# Patient Record
Sex: Female | Born: 1980 | Race: Black or African American | Hispanic: No | Marital: Single | Smoking: Never smoker
Health system: Southern US, Academic
[De-identification: ages and names within clinical notes are randomized; demographics above are authoritative.]

## PROBLEM LIST (undated history)

## (undated) ENCOUNTER — Emergency Department (HOSPITAL_BASED_OUTPATIENT_CLINIC_OR_DEPARTMENT_OTHER): Admission: EM | Payer: Medicaid Other

## (undated) DIAGNOSIS — R51 Headache: Secondary | ICD-10-CM

## (undated) DIAGNOSIS — R519 Headache, unspecified: Secondary | ICD-10-CM

## (undated) DIAGNOSIS — K5792 Diverticulitis of intestine, part unspecified, without perforation or abscess without bleeding: Secondary | ICD-10-CM

## (undated) DIAGNOSIS — F319 Bipolar disorder, unspecified: Secondary | ICD-10-CM

## (undated) DIAGNOSIS — IMO0001 Reserved for inherently not codable concepts without codable children: Secondary | ICD-10-CM

## (undated) DIAGNOSIS — F209 Schizophrenia, unspecified: Secondary | ICD-10-CM

## (undated) DIAGNOSIS — G4730 Sleep apnea, unspecified: Secondary | ICD-10-CM

## (undated) DIAGNOSIS — N390 Urinary tract infection, site not specified: Secondary | ICD-10-CM

## (undated) DIAGNOSIS — J449 Chronic obstructive pulmonary disease, unspecified: Secondary | ICD-10-CM

## (undated) HISTORY — PX: NECK SURGERY: SHX720

## (undated) HISTORY — PX: COLON SURGERY: SHX602

## (undated) HISTORY — PX: HX TUBAL LIGATION: SHX77

## (undated) HISTORY — PX: ENDOMETRIAL ABLATION: SHX621

---
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
## 2007-05-16 HISTORY — PX: TUBAL LIGATION: SHX77

## 2007-10-08 ENCOUNTER — Emergency Department (HOSPITAL_COMMUNITY): Admission: EM | Admit: 2007-10-08 | Discharge: 2007-10-08 | Payer: Self-pay | Admitting: Emergency Medicine

## 2008-06-22 ENCOUNTER — Emergency Department (HOSPITAL_COMMUNITY): Admission: EM | Admit: 2008-06-22 | Discharge: 2008-06-22 | Payer: Self-pay | Admitting: Emergency Medicine

## 2008-12-14 ENCOUNTER — Emergency Department (HOSPITAL_COMMUNITY): Admission: EM | Admit: 2008-12-14 | Discharge: 2008-12-15 | Payer: Self-pay | Admitting: Emergency Medicine

## 2009-07-19 ENCOUNTER — Emergency Department (HOSPITAL_COMMUNITY): Admission: EM | Admit: 2009-07-19 | Discharge: 2009-07-19 | Payer: Self-pay | Admitting: Family Medicine

## 2009-09-05 ENCOUNTER — Emergency Department (HOSPITAL_COMMUNITY): Admission: EM | Admit: 2009-09-05 | Discharge: 2009-09-05 | Payer: Self-pay | Admitting: Family Medicine

## 2009-11-28 ENCOUNTER — Ambulatory Visit: Payer: Self-pay | Admitting: Psychiatry

## 2009-11-28 ENCOUNTER — Inpatient Hospital Stay (HOSPITAL_COMMUNITY): Admission: RE | Admit: 2009-11-28 | Discharge: 2009-12-04 | Payer: Self-pay | Admitting: Psychiatry

## 2009-11-29 ENCOUNTER — Encounter: Payer: Self-pay | Admitting: Psychiatry

## 2010-05-11 ENCOUNTER — Emergency Department (HOSPITAL_COMMUNITY)
Admission: EM | Admit: 2010-05-11 | Discharge: 2010-05-11 | Payer: Self-pay | Source: Home / Self Care | Admitting: Emergency Medicine

## 2010-07-25 LAB — GC/CHLAMYDIA PROBE AMP, GENITAL
Chlamydia, DNA Probe: NEGATIVE
GC Probe Amp, Genital: NEGATIVE

## 2010-07-25 LAB — URINE MICROSCOPIC-ADD ON

## 2010-07-25 LAB — URINALYSIS, ROUTINE W REFLEX MICROSCOPIC
Leukocytes, UA: NEGATIVE
Protein, ur: NEGATIVE mg/dL
Specific Gravity, Urine: 1.02 (ref 1.005–1.030)
pH: 7 (ref 5.0–8.0)

## 2010-07-25 LAB — POCT PREGNANCY, URINE: Preg Test, Ur: NEGATIVE

## 2010-07-30 LAB — COMPREHENSIVE METABOLIC PANEL
ALT: 14 U/L (ref 0–35)
Alkaline Phosphatase: 51 U/L (ref 39–117)
BUN: 7 mg/dL (ref 6–23)
Chloride: 107 mEq/L (ref 96–112)
Creatinine, Ser: 0.92 mg/dL (ref 0.4–1.2)
Glucose, Bld: 94 mg/dL (ref 70–99)
Potassium: 3.7 mEq/L (ref 3.5–5.1)
Sodium: 139 mEq/L (ref 135–145)
Total Bilirubin: 0.6 mg/dL (ref 0.3–1.2)
Total Protein: 6.8 g/dL (ref 6.0–8.3)

## 2010-07-30 LAB — URINALYSIS, ROUTINE W REFLEX MICROSCOPIC
Glucose, UA: NEGATIVE mg/dL
Ketones, ur: NEGATIVE mg/dL
Urobilinogen, UA: 0.2 mg/dL (ref 0.0–1.0)
pH: 5.5 (ref 5.0–8.0)

## 2010-07-30 LAB — URINE DRUGS OF ABUSE SCREEN W ALC, ROUTINE (REF LAB)
Barbiturate Quant, Ur: NEGATIVE
Benzodiazepines.: NEGATIVE
Cocaine Metabolites: NEGATIVE
Creatinine,U: 202.4 mg/dL
Opiate Screen, Urine: NEGATIVE

## 2010-07-30 LAB — URINE MICROSCOPIC-ADD ON

## 2010-07-30 LAB — CBC
HCT: 39.6 % (ref 36.0–46.0)
RBC: 4.28 MIL/uL (ref 3.87–5.11)

## 2010-08-20 LAB — URINE MICROSCOPIC-ADD ON

## 2010-08-20 LAB — URINALYSIS, ROUTINE W REFLEX MICROSCOPIC
Glucose, UA: NEGATIVE mg/dL
Hgb urine dipstick: NEGATIVE
Specific Gravity, Urine: 1.02 (ref 1.005–1.030)

## 2010-08-20 LAB — URINE CULTURE

## 2010-08-20 LAB — POCT PREGNANCY, URINE: Preg Test, Ur: NEGATIVE

## 2010-08-30 LAB — HEMOCCULT GUIAC POC 1CARD (OFFICE): Fecal Occult Bld: NEGATIVE

## 2011-02-08 LAB — WET PREP, GENITAL
Trich, Wet Prep: NONE SEEN
Yeast Wet Prep HPF POC: NONE SEEN

## 2011-02-08 LAB — URINE MICROSCOPIC-ADD ON

## 2011-02-08 LAB — URINALYSIS, ROUTINE W REFLEX MICROSCOPIC
Glucose, UA: NEGATIVE
Ketones, ur: NEGATIVE
pH: 7.5

## 2011-02-08 LAB — RPR: RPR Ser Ql: NONREACTIVE

## 2011-02-08 LAB — GC/CHLAMYDIA PROBE AMP, GENITAL
Chlamydia, DNA Probe: NEGATIVE
GC Probe Amp, Genital: NEGATIVE

## 2011-03-13 ENCOUNTER — Encounter: Payer: Self-pay | Admitting: Medical

## 2011-05-18 ENCOUNTER — Encounter: Payer: Self-pay | Admitting: Family Medicine

## 2011-05-18 ENCOUNTER — Emergency Department (HOSPITAL_BASED_OUTPATIENT_CLINIC_OR_DEPARTMENT_OTHER)
Admission: EM | Admit: 2011-05-18 | Discharge: 2011-05-18 | Discharge status: Against Medical Advice | Payer: Medicaid Other | Attending: Emergency Medicine | Admitting: Emergency Medicine

## 2011-05-18 DIAGNOSIS — J069 Acute upper respiratory infection, unspecified: Secondary | ICD-10-CM | POA: Insufficient documentation

## 2011-05-18 HISTORY — DX: Schizophrenia, unspecified: F20.9

## 2011-05-18 HISTORY — DX: Urinary tract infection, site not specified: N39.0

## 2011-05-18 HISTORY — DX: Bipolar disorder, unspecified: F31.9

## 2011-05-18 NOTE — ED Notes (Signed)
Pt c/o itchy eyes, headache, sinus congestion and requesting medication refill for several medications. Pt also reports having a recent UTI and not finishing abx for same. Pt requesting to have urine checked.

## 2012-05-20 ENCOUNTER — Encounter (HOSPITAL_COMMUNITY): Payer: Self-pay | Admitting: Emergency Medicine

## 2012-05-20 ENCOUNTER — Emergency Department (HOSPITAL_COMMUNITY)
Admission: EM | Admit: 2012-05-20 | Discharge: 2012-05-20 | Disposition: A | Discharge status: Home / Self Care | Payer: Medicaid Other | Attending: Emergency Medicine | Admitting: Emergency Medicine

## 2012-05-20 DIAGNOSIS — Z87440 Personal history of urinary (tract) infections: Secondary | ICD-10-CM | POA: Insufficient documentation

## 2012-05-20 DIAGNOSIS — H579 Unspecified disorder of eye and adnexa: Secondary | ICD-10-CM | POA: Insufficient documentation

## 2012-05-20 DIAGNOSIS — F319 Bipolar disorder, unspecified: Secondary | ICD-10-CM | POA: Insufficient documentation

## 2012-05-20 DIAGNOSIS — J3489 Other specified disorders of nose and nasal sinuses: Secondary | ICD-10-CM | POA: Insufficient documentation

## 2012-05-20 DIAGNOSIS — H109 Unspecified conjunctivitis: Secondary | ICD-10-CM | POA: Insufficient documentation

## 2012-05-20 DIAGNOSIS — J029 Acute pharyngitis, unspecified: Secondary | ICD-10-CM | POA: Insufficient documentation

## 2012-05-20 DIAGNOSIS — F209 Schizophrenia, unspecified: Secondary | ICD-10-CM | POA: Insufficient documentation

## 2012-05-20 DIAGNOSIS — F17200 Nicotine dependence, unspecified, uncomplicated: Secondary | ICD-10-CM | POA: Insufficient documentation

## 2012-05-20 DIAGNOSIS — H5789 Other specified disorders of eye and adnexa: Secondary | ICD-10-CM | POA: Insufficient documentation

## 2012-05-20 DIAGNOSIS — Z9851 Tubal ligation status: Secondary | ICD-10-CM | POA: Insufficient documentation

## 2012-05-20 MED ORDER — BACITRACIN-POLYMYXIN B 500-10000 UNIT/GM OP OINT: TOPICAL_OINTMENT | Freq: Two times a day (BID) | OPHTHALMIC | Status: DC

## 2012-05-20 MED ORDER — NAPHAZOLINE-PHENIRAMINE 0.025-0.3 % OP SOLN: 1.0000 [drp] | OPHTHALMIC | Status: DC | PRN

## 2012-05-20 MED ORDER — FLUORESCEIN SODIUM 1 MG OP STRP
ORAL_STRIP | OPHTHALMIC | Status: AC
  Administered 2012-05-20: 10:00:00
  Filled 2012-05-20: qty 1

## 2012-05-20 MED ORDER — TETRACAINE HCL 0.5 % OP SOLN
2.0000 [drp] | Freq: Once | OPHTHALMIC | Status: AC
  Administered 2012-05-20: 2 [drp] via OPHTHALMIC
  Filled 2012-05-20: qty 2

## 2012-05-20 NOTE — ED Provider Notes (Signed)
History     CSN: [id number]  Arrival date & time 05/20/12  0805   First MD Initiated Contact with Patient 05/20/12 [phone]      Chief Complaint  Patient presents with  . Eye Pain    (Consider location/radiation/quality/duration/timing/severity/associated sxs/prior treatment) HPI SUBJECTIVE:  32 y.o. female with burning, redness, discharge and mattering in left eye for 4 days.  Patient has associated sore throat  And nasal pain.No other symptoms.  No significant prior ophthalmological history. No change in visual acuity, no photophobia, no severe eye pain. No known injury   Past Medical History  Diagnosis Date  . Bipolar disorder   . Schizophrenia   . UTI (lower urinary tract infection)     Past Surgical History  Procedure Date  . Tubal ligation     History reviewed. No pertinent family history.  History  Substance Use Topics  . Smoking status: Current Every Day Smoker  . Smokeless tobacco: Not on file  . Alcohol Use: No    OB History    Grav Para Term Preterm Abortions TAB SAB Ect Mult Living                  Review of Systems Ten systems reviewed and are negative for acute change, except as noted in the HPI.   Allergies  Review of patient's allergies indicates no known allergies.  Home Medications   Current Outpatient Rx  Name  Route  Sig  Dispense  Refill  . METRONIDAZOLE PO   Oral   Take 4 tablets by mouth once.         Marland Kitchen SIMILASAN EYE DROPS #1 OP   Left Eye   Place 3 drops into the left eye daily as needed. Eye pain.           BP 128/68  Pulse 108  Temp 98.4 F (36.9 C) (Oral)  Resp 18  SpO2 100%  Physical Exam Patient appears well, vitals signs are normal.  Nursing note and vitals reviewed. HENT:  Head: Normocephalic and atraumatic.  Eyes: left eye with findings of typical conjunctivitis noted; erythema and discharge. PERRLA, no foreign body noted.  No corneal abrasion. No periorbital cellulitis. The corneas are clear and fundi  normal. Visual acuity normal. Oropharyngeal exam - erythematous and cobblestoning ofthe posterior pharynx consistent with Post Nasal Drip. Neck: Normal range of motion.  Cardiovascular: Normal rate, regular rhythm and normal heart sounds.  Exam reveals no gallop and no friction rub.   No murmur heard. Pulmonary/Chest: Effort normal and breath sounds normal. No respiratory distress.  Abdominal: Soft. Bowel sounds are normal. She exhibits no distension and no mass. There is no tenderness. There is no guarding.  Neurological: She is alert and oriented to person, place, and time.  Skin: Skin is warm and dry. She is not diaphoretic.     ED Course  Procedures (including critical care time Corneal Abrasion Exam VCO. Risks, benefits, and alternatives explained. 2 drops of tetracaine (PONTOCAINE) 0.5 % ophthalmic solution were applied to the L eye. Fluorescein 1 MG ophthalmic strip applied the the surface of the L eye Wood's lamp used to screen for abrasion  Eye flushed with sterile saline Patient tolerated th procedure well No corneal abrasions found on the exam.   Labs Reviewed - No data to display No results found.   1. Conjunctivitis       MDM  Patient presentation consistent with bacterial conjunctivitis.There is dischare but no, corneal abrasions, entrapment, consensual photophobia, or dendritic  staining with fluorescein study.  Presentation non-concerning for iritis, bacterial conjunctivitis, corneal abrasions, or HSV.  Antibiotics are indicated and patient will be prescribed naphazoline for itching.  Personal hygiene and frequent handwashing discussed.  Patient advised to followup with ophthalmologist if symptoms persist or worsen in any way including vision change or purulent discharge.  Patient verbalizes understanding and is agreeable with discharge.        Arthor Captain, PA-C 05/23/12 1011

## 2012-05-20 NOTE — ED Notes (Signed)
Pt c/o left eye redness x 3 days and sore throat

## 2012-05-20 NOTE — ED Notes (Signed)
Left eye red, irritated with swollen lids. Onset 2-3 days ago. No known injury. Also c/o sore throat.

## 2012-05-22 ENCOUNTER — Emergency Department (HOSPITAL_COMMUNITY)
Admission: EM | Admit: 2012-05-22 | Discharge: 2012-05-22 | Disposition: A | Discharge status: Home / Self Care | Payer: Medicaid Other | Source: Home / Self Care | Attending: Emergency Medicine | Admitting: Emergency Medicine

## 2012-05-22 ENCOUNTER — Encounter (HOSPITAL_COMMUNITY): Payer: Self-pay | Admitting: *Deleted

## 2012-05-22 DIAGNOSIS — K122 Cellulitis and abscess of mouth: Secondary | ICD-10-CM

## 2012-05-22 MED ORDER — AMOXICILLIN-POT CLAVULANATE 500-125 MG PO TABS: 1.0000 | ORAL_TABLET | Freq: Two times a day (BID) | ORAL | Status: AC

## 2012-05-22 MED ORDER — ACETAMINOPHEN-CODEINE #3 300-30 MG PO TABS: 1.0000 | ORAL_TABLET | Freq: Four times a day (QID) | ORAL | Status: DC | PRN

## 2012-05-22 NOTE — ED Notes (Signed)
PT  REPORTS    SWELLING  TO  L  SIDE OF EAR  AS  WELL  SWELLING IN  HER THROAT   SEEN  YEST  ER   FOR  CONJUNCTIVITIS        STATES  WAS  GIVEN  CREAM  AND  DROPS        THE  L  EYE  IS  RED  IRRITATED   AND  WATERY        SHE  IS  SITTING  UPRIGHT ON  EXAM TABLE   SPEAKING IN  COMPLETE  SENTANCES

## 2012-05-22 NOTE — ED Provider Notes (Signed)
History     CSN: [id number]  Arrival date & time 05/22/12  1001   First MD Initiated Contact with Patient 05/22/12 1011      Chief Complaint  Patient presents with  . Sore Throat    (Consider location/radiation/quality/duration/timing/severity/associated sxs/prior treatment) HPI Comments: Patient presents urgent care describing that although her left eye swelling and redness has improved he started with the antibiotic eye ointment, she feels that when she swallows it constantly hurts and "it feels like something is hanging in the back of her throat". Her throat is still not improving and is actually worse. Patient denies any fevers, shortness of breath or coughing. She describes it in front of her left ear she has a little bump that is sore at touch. ( Palpable preauricular lymph node).   Patient is a 32 y.o. female presenting with pharyngitis. The history is provided by the patient.  Sore Throat This is a new problem. The current episode started more than 2 days ago. The problem occurs constantly. The problem has been gradually worsening. Pertinent negatives include no abdominal pain. The symptoms are aggravated by swallowing. Nothing relieves the symptoms. The treatment provided no relief.    Past Medical History  Diagnosis Date  . Bipolar disorder   . Schizophrenia   . UTI (lower urinary tract infection)     Past Surgical History  Procedure Date  . Tubal ligation     No family history on file.  History  Substance Use Topics  . Smoking status: Current Every Day Smoker  . Smokeless tobacco: Not on file  . Alcohol Use: No    OB History    Grav Para Term Preterm Abortions TAB SAB Ect Mult Living                  Review of Systems  Constitutional: Negative for activity change and appetite change.  HENT: Positive for congestion, sore throat and trouble swallowing. Negative for facial swelling, rhinorrhea, drooling, mouth sores, neck pain, neck stiffness, dental problem  and postnasal drip.   Eyes: Positive for redness. Negative for photophobia, pain and visual disturbance.  Gastrointestinal: Negative for abdominal pain.  Skin: Negative for rash.  Neurological: Negative for dizziness.    Allergies  Review of patient's allergies indicates no known allergies.  Home Medications   Current Outpatient Rx  Name  Route  Sig  Dispense  Refill  . AMOXICILLIN-POT CLAVULANATE 500-125 MG PO TABS   Oral   Take 1 tablet (500 mg total) by mouth 2 (two) times daily.   20 tablet   0   . BACITRACIN-POLYMYXIN B 500-10000 UNIT/GM OP OINT   Left Eye   Place into the left eye every 12 (twelve) hours. apply to eye every 12 hours while awake   3.5 g   0   . METRONIDAZOLE PO   Oral   Take 4 tablets by mouth once.         Marland Kitchen SIMILASAN EYE DROPS #1 OP   Left Eye   Place 3 drops into the left eye daily as needed. Eye pain.         Marland Kitchen NAPHAZOLINE-PHENIRAMINE 0.025-0.3 % OP SOLN   Ophthalmic   Apply 1-2 drops to eye every 4 (four) hours as needed.   15 mL   0     BP 130/90  Pulse 115  Temp 98.7 F (37.1 C) (Oral)  Resp 18  SpO2 96%  Physical Exam  Nursing note and vitals reviewed. Constitutional: She  appears well-developed and well-nourished. No distress.  HENT:  Head: Normocephalic. No trismus in the jaw.  Right Ear: Tympanic membrane and ear canal normal.  Left Ear: Tympanic membrane and ear canal normal.  Mouth/Throat: Mucous membranes are normal. No oral lesions. Uvula swelling present. No dental abscesses. Posterior oropharyngeal erythema present. No oropharyngeal exudate, posterior oropharyngeal edema or tonsillar abscesses.    Eyes: EOM are normal. Pupils are equal, round, and reactive to light. Right eye exhibits no discharge. Left eye exhibits no discharge. No scleral icterus.  Neurological: She is alert.  Skin: No rash noted. No erythema.    ED Course  Procedures (including critical care time)  Labs Reviewed - No data to display No  results found.   1. Uvulitis    #2 BACTERIAL conjunctivitis   MDM  Problem #1. Patient has been prescribed Augmentin prescription and Tylenol #3 and encouraged to drink abundant fluids. She was instructed about symptoms that should warrant her evaluation emergency department most importantly difficulty swallowing solids and fluids. She acknowledges and agrees with treatment plan and followup care as necessary  Problem #2 left eye conjunctivitis/blepharitis patient is currently using an antibiotic ointment and reporting improvement. Have instructed patient to followup with ophthalmologist if no partial or complete improvement in the next 48 hours. ( Patient reports discomfort, no photophobia, no visual changes and had a negative FLUORESCEIN same test 48 hours ago)        Jimmie Molly, MD 05/22/12 1045

## 2012-05-23 NOTE — ED Provider Notes (Signed)
Medical screening examination/treatment/procedure(s) were performed by non-physician practitioner and as supervising physician I was immediately available for consultation/collaboration.   Flint Melter, MD 05/23/12 [phone]

## 2012-06-03 ENCOUNTER — Emergency Department (HOSPITAL_COMMUNITY)
Admission: EM | Admit: 2012-06-03 | Discharge: 2012-06-03 | Disposition: A | Discharge status: Home / Self Care | Payer: Medicaid Other | Source: Home / Self Care | Attending: Emergency Medicine | Admitting: Emergency Medicine

## 2012-06-03 ENCOUNTER — Encounter (HOSPITAL_COMMUNITY): Payer: Self-pay | Admitting: *Deleted

## 2012-06-03 DIAGNOSIS — J029 Acute pharyngitis, unspecified: Secondary | ICD-10-CM

## 2012-06-03 DIAGNOSIS — K122 Cellulitis and abscess of mouth: Secondary | ICD-10-CM

## 2012-06-03 LAB — POCT RAPID STREP A: Streptococcus, Group A Screen (Direct): NEGATIVE

## 2012-06-03 MED ORDER — CLINDAMYCIN HCL 300 MG PO CAPS: 300.0000 mg | ORAL_CAPSULE | Freq: Two times a day (BID) | ORAL | Status: DC

## 2012-06-03 MED ORDER — FLUTICASONE PROPIONATE 50 MCG/ACT NA SUSP: 2.0000 | Freq: Every day | NASAL | Status: DC

## 2012-06-03 MED ORDER — PREDNISONE 20 MG PO TABS: 20.0000 mg | ORAL_TABLET | Freq: Two times a day (BID) | ORAL | Status: DC

## 2012-06-03 NOTE — ED Notes (Signed)
Recheck from 1/6 and 1/8 for uvulaitis and headache.  States she had conjunctivitis but that cleared up with steroid drops from the eye doctor.  Can't get an appt. With PCP until 2/4.  Getting chills and gets hot, but does not know if she is running a fever.  C/o coughing and sneezing.  Nasal drainage is white and congestion from chest/throat is green.  C/o sorethroat, and aching in her back and feels weak.  No chest pain.

## 2012-06-03 NOTE — ED Provider Notes (Signed)
Medical screening examination/treatment/procedure(s) were performed by non-physician practitioner and as supervising physician I was immediately available for consultation/collaboration.  Leslee Home, M.D.   Reuben Likes, MD 06/03/12 [phone]

## 2012-06-03 NOTE — ED Provider Notes (Signed)
History     CSN: [id number]  Arrival date & time 06/03/12  1537   First MD Initiated Contact with Patient 06/03/12 1728      Chief Complaint  Patient presents with  . Oral Swelling    (Consider location/radiation/quality/duration/timing/severity/associated sxs/prior treatment) The history is provided by the patient.  32 year female with known history of seasonal allergies for which she takes claritin daily.  Pt states she was evaluated in UC approximately 2 weeks ago for sore throat.  Subsequently dx with uvulitis and was prescribed augmentin.  Has since finished antibiotics and continues to have symptoms of sore throat.  Pt denies fever but endorses sinus congestion as well as generalized fatigue.  Denies change in home medications, no rash or s/s of allergic reaction.  Current smoker.   Past Medical History  Diagnosis Date  . Bipolar disorder   . Schizophrenia   . UTI (lower urinary tract infection)     Past Surgical History  Procedure Date  . Tubal ligation     Family History  Problem Relation Age of Onset  . Diabetes Mother   . Kidney cancer Father     History  Substance Use Topics  . Smoking status: Current Every Day Smoker -- 1.5 packs/day    Types: Cigarettes  . Smokeless tobacco: Not on file  . Alcohol Use: No    OB History    Grav Para Term Preterm Abortions TAB SAB Ect Mult Living                  Review of Systems  All other systems reviewed and are negative.    Allergies  Ibuprofen  Home Medications   Current Outpatient Rx  Name  Route  Sig  Dispense  Refill  . LORATADINE 10 MG PO TABS   Oral   Take 10 mg by mouth daily.         . TOBRAMYCIN-DEXAMETHASONE 0.3-0.1 % OP SUSP   Both Eyes   Place 1 drop into both eyes every 4 (four) hours while awake.         . ACETAMINOPHEN-CODEINE #3 300-30 MG PO TABS   Oral   Take 1-2 tablets by mouth every 6 (six) hours as needed for pain.   15 tablet   0   . BACITRACIN-POLYMYXIN B 500-10000  UNIT/GM OP OINT   Left Eye   Place into the left eye every 12 (twelve) hours. apply to eye every 12 hours while awake   3.5 g   0   . CLINDAMYCIN HCL 300 MG PO CAPS   Oral   Take 1 capsule (300 mg total) by mouth 2 (two) times daily.   20 capsule   0   . FLUTICASONE PROPIONATE 50 MCG/ACT NA SUSP   Nasal   Place 2 sprays into the nose daily.   16 g   3   . METRONIDAZOLE PO   Oral   Take 4 tablets by mouth once.         Marland Kitchen SIMILASAN EYE DROPS #1 OP   Left Eye   Place 3 drops into the left eye daily as needed. Eye pain.         Marland Kitchen NAPHAZOLINE-PHENIRAMINE 0.025-0.3 % OP SOLN   Ophthalmic   Apply 1-2 drops to eye every 4 (four) hours as needed.   15 mL   0   . PREDNISONE 20 MG PO TABS   Oral   Take 1 tablet (20 mg total) by mouth  2 (two) times daily.   6 tablet   0     BP 114/76  Pulse 96  Temp 97.8 F (36.6 C) (Oral)  Resp 20  SpO2 99%  LMP 05/31/2012  Physical Exam  Nursing note and vitals reviewed. Constitutional: She is oriented to person, place, and time. Vital signs are normal. She appears well-developed and well-nourished. She is active and cooperative.  HENT:  Head: Normocephalic.  Right Ear: Tympanic membrane and external ear normal.  Left Ear: Tympanic membrane and external ear normal.  Nose: Nose normal. Right sinus exhibits no maxillary sinus tenderness and no frontal sinus tenderness. Left sinus exhibits no maxillary sinus tenderness and no frontal sinus tenderness.  Mouth/Throat: Uvula is midline and mucous membranes are normal. Uvula swelling present. Posterior oropharyngeal erythema present. No oropharyngeal exudate or tonsillar abscesses.       1-2+ bilateral tonsillar edema  Eyes: Conjunctivae normal are normal. Pupils are equal, round, and reactive to light. No scleral icterus.  Neck: Trachea normal. Neck supple. No spinous process tenderness and no muscular tenderness present. No mass and no thyromegaly present.  Cardiovascular: Normal  rate, regular rhythm, normal heart sounds and normal pulses.   Pulmonary/Chest: Effort normal and breath sounds normal.  Lymphadenopathy:       Head (right side): No submental, no submandibular, no tonsillar, no preauricular, no posterior auricular and no occipital adenopathy present.       Head (left side): No submental, no submandibular, no tonsillar, no preauricular, no posterior auricular and no occipital adenopathy present.    She has cervical adenopathy.       Shotty cervical nodes  Neurological: She is alert and oriented to person, place, and time. No cranial nerve deficit or sensory deficit.  Skin: Skin is warm and dry.  Psychiatric: She has a normal mood and affect. Her speech is normal and behavior is normal. Judgment and thought content normal. Cognition and memory are normal.    ED Course  Procedures (including critical care time)   Labs Reviewed  POCT RAPID STREP A ([facility] CARE ONLY)  POCT INFECTIOUS MONO SCREEN   No results found.   1. Uvulitis   2. Pharyngitis       MDM  Discussed with Dr. Amada Jupiter, pt prescribed oral steroids and clindamycin.  To follow up with ENT if symptoms are not improved.         Johnsie Kindred, NP 06/03/12 1823

## 2012-07-03 ENCOUNTER — Encounter (HOSPITAL_COMMUNITY): Payer: Self-pay | Admitting: Emergency Medicine

## 2012-07-03 ENCOUNTER — Emergency Department (HOSPITAL_COMMUNITY)
Admission: EM | Admit: 2012-07-03 | Discharge: 2012-07-03 | Disposition: A | Discharge status: Home / Self Care | Payer: No Typology Code available for payment source | Attending: Emergency Medicine | Admitting: Emergency Medicine

## 2012-07-03 ENCOUNTER — Emergency Department (HOSPITAL_COMMUNITY): Payer: No Typology Code available for payment source

## 2012-07-03 DIAGNOSIS — F319 Bipolar disorder, unspecified: Secondary | ICD-10-CM | POA: Insufficient documentation

## 2012-07-03 DIAGNOSIS — S0993XA Unspecified injury of face, initial encounter: Secondary | ICD-10-CM | POA: Insufficient documentation

## 2012-07-03 DIAGNOSIS — Y92410 Unspecified street and highway as the place of occurrence of the external cause: Secondary | ICD-10-CM | POA: Insufficient documentation

## 2012-07-03 DIAGNOSIS — M549 Dorsalgia, unspecified: Secondary | ICD-10-CM

## 2012-07-03 DIAGNOSIS — Z87440 Personal history of urinary (tract) infections: Secondary | ICD-10-CM | POA: Insufficient documentation

## 2012-07-03 DIAGNOSIS — Y9389 Activity, other specified: Secondary | ICD-10-CM | POA: Insufficient documentation

## 2012-07-03 DIAGNOSIS — M542 Cervicalgia: Secondary | ICD-10-CM

## 2012-07-03 DIAGNOSIS — F17200 Nicotine dependence, unspecified, uncomplicated: Secondary | ICD-10-CM | POA: Insufficient documentation

## 2012-07-03 DIAGNOSIS — F209 Schizophrenia, unspecified: Secondary | ICD-10-CM | POA: Insufficient documentation

## 2012-07-03 DIAGNOSIS — IMO0002 Reserved for concepts with insufficient information to code with codable children: Secondary | ICD-10-CM | POA: Insufficient documentation

## 2012-07-03 DIAGNOSIS — Z792 Long term (current) use of antibiotics: Secondary | ICD-10-CM | POA: Insufficient documentation

## 2012-07-03 DIAGNOSIS — Z79899 Other long term (current) drug therapy: Secondary | ICD-10-CM | POA: Insufficient documentation

## 2012-07-03 DIAGNOSIS — S20219A Contusion of unspecified front wall of thorax, initial encounter: Secondary | ICD-10-CM | POA: Insufficient documentation

## 2012-07-03 MED ORDER — METHOCARBAMOL 500 MG PO TABS: 500.0000 mg | ORAL_TABLET | Freq: Two times a day (BID) | ORAL | Status: DC

## 2012-07-03 MED ORDER — TRAMADOL HCL 50 MG PO TABS: 50.0000 mg | ORAL_TABLET | Freq: Four times a day (QID) | ORAL | Status: DC | PRN

## 2012-07-03 NOTE — ED Notes (Signed)
Patient transported to X-ray 
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
## 2012-07-03 NOTE — ED Provider Notes (Signed)
Medical screening examination/treatment/procedure(s) were conducted as a shared visit with non-physician practitioner(s) and myself.  I personally evaluated the patient during the encounter.  Derwood Kaplan, MD 07/03/12 1723

## 2013-06-22 ENCOUNTER — Encounter (HOSPITAL_COMMUNITY): Payer: Self-pay | Admitting: Emergency Medicine

## 2013-06-22 ENCOUNTER — Emergency Department (HOSPITAL_COMMUNITY)
Admission: EM | Admit: 2013-06-22 | Discharge: 2013-06-22 | Disposition: A | Discharge status: Home / Self Care | Payer: No Typology Code available for payment source | Attending: Emergency Medicine | Admitting: Emergency Medicine

## 2013-06-22 DIAGNOSIS — F209 Schizophrenia, unspecified: Secondary | ICD-10-CM | POA: Insufficient documentation

## 2013-06-22 DIAGNOSIS — Y9389 Activity, other specified: Secondary | ICD-10-CM | POA: Insufficient documentation

## 2013-06-22 DIAGNOSIS — Z87440 Personal history of urinary (tract) infections: Secondary | ICD-10-CM | POA: Insufficient documentation

## 2013-06-22 DIAGNOSIS — F319 Bipolar disorder, unspecified: Secondary | ICD-10-CM | POA: Insufficient documentation

## 2013-06-22 DIAGNOSIS — S0993XA Unspecified injury of face, initial encounter: Secondary | ICD-10-CM | POA: Insufficient documentation

## 2013-06-22 DIAGNOSIS — F17200 Nicotine dependence, unspecified, uncomplicated: Secondary | ICD-10-CM | POA: Insufficient documentation

## 2013-06-22 DIAGNOSIS — S199XXA Unspecified injury of neck, initial encounter: Principal | ICD-10-CM

## 2013-06-22 DIAGNOSIS — Y92410 Unspecified street and highway as the place of occurrence of the external cause: Secondary | ICD-10-CM | POA: Insufficient documentation

## 2013-06-22 MED ORDER — PREDNISONE 20 MG PO TABS: 60.0000 mg | ORAL_TABLET | Freq: Once | ORAL | Status: DC

## 2013-06-22 MED ORDER — CYCLOBENZAPRINE HCL 5 MG PO TABS: 5.0000 mg | ORAL_TABLET | Freq: Three times a day (TID) | ORAL | Status: DC | PRN

## 2013-06-22 MED ORDER — ALBUTEROL (5 MG/ML) CONTINUOUS INHALATION SOLN: 5.0000 mg/h | INHALATION_SOLUTION | Freq: Once | RESPIRATORY_TRACT | Status: DC

## 2013-06-22 MED ORDER — HYDROCODONE-ACETAMINOPHEN 5-325 MG PO TABS: 1.0000 | ORAL_TABLET | Freq: Four times a day (QID) | ORAL | Status: DC | PRN
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
## 2013-06-23 NOTE — ED Provider Notes (Signed)
Medical screening examination/treatment/procedure(s) were performed by non-physician practitioner and as supervising physician I was immediately available for consultation/collaboration.  EKG Interpretation   None         Darlys Galesavid Houa Ackert, MD 06/23/13 [phone]

## 2014-01-02 ENCOUNTER — Ambulatory Visit (HOSPITAL_BASED_OUTPATIENT_CLINIC_OR_DEPARTMENT_OTHER): Payer: No Typology Code available for payment source | Attending: Internal Medicine

## 2014-01-04 ENCOUNTER — Ambulatory Visit (HOSPITAL_BASED_OUTPATIENT_CLINIC_OR_DEPARTMENT_OTHER): Payer: Medicaid Other

## 2014-07-23 ENCOUNTER — Encounter (HOSPITAL_COMMUNITY): Payer: Self-pay

## 2014-07-23 ENCOUNTER — Emergency Department (HOSPITAL_COMMUNITY)
Admission: EM | Admit: 2014-07-23 | Discharge: 2014-07-23 | Disposition: A | Discharge status: Home / Self Care | Payer: Medicaid Other | Attending: Emergency Medicine | Admitting: Emergency Medicine

## 2014-07-23 DIAGNOSIS — Z3202 Encounter for pregnancy test, result negative: Secondary | ICD-10-CM | POA: Insufficient documentation

## 2014-07-23 DIAGNOSIS — Z79899 Other long term (current) drug therapy: Secondary | ICD-10-CM | POA: Insufficient documentation

## 2014-07-23 DIAGNOSIS — N938 Other specified abnormal uterine and vaginal bleeding: Secondary | ICD-10-CM | POA: Insufficient documentation

## 2014-07-23 DIAGNOSIS — F319 Bipolar disorder, unspecified: Secondary | ICD-10-CM | POA: Diagnosis not present

## 2014-07-23 DIAGNOSIS — Z9851 Tubal ligation status: Secondary | ICD-10-CM | POA: Diagnosis not present

## 2014-07-23 DIAGNOSIS — Z87440 Personal history of urinary (tract) infections: Secondary | ICD-10-CM | POA: Insufficient documentation

## 2014-07-23 DIAGNOSIS — N939 Abnormal uterine and vaginal bleeding, unspecified: Secondary | ICD-10-CM

## 2014-07-23 DIAGNOSIS — Z720 Tobacco use: Secondary | ICD-10-CM | POA: Diagnosis not present

## 2014-07-23 LAB — WET PREP, GENITAL
Trich, Wet Prep: NONE SEEN
WBC, Wet Prep HPF POC: NONE SEEN
Yeast Wet Prep HPF POC: NONE SEEN

## 2014-07-23 LAB — POC URINE PREG, ED: PREG TEST UR: NEGATIVE

## 2014-07-23 NOTE — ED Notes (Signed)
Pt reporting vaginal bleeding since February 24th.  Sts she is passing large clots and has been having heavy vaginal bleedings.  Sts periods normally last 10 days but this is abnormal for her.  Sts she has been having to change tampon every two hours.

## 2014-07-23 NOTE — ED Provider Notes (Signed)
CSN: [id number]     Arrival date & time 07/23/14  1550 History   First MD Initiated Contact with Patient 07/23/14 1815     Chief Complaint  Patient presents with  . Vaginal Bleeding     (Consider location/radiation/quality/duration/timing/severity/associated sxs/prior Treatment) HPI  34 y/o female w/ PMH fibroids who presents with vaginal bleeding since 2/24. Passing nickel sized clots, and using approx 5 pads  No lightheadedness, no shortness of breath.  Tubal ligation in the past.  Past Medical History  Diagnosis Date  . Bipolar disorder   . Schizophrenia   . UTI (lower urinary tract infection)    Past Surgical History  Procedure Laterality Date  . Tubal ligation     Family History  Problem Relation Age of Onset  . Diabetes Mother   . Kidney cancer Father    History  Substance Use Topics  . Smoking status: Current Every Day Smoker -- 1.50 packs/day    Types: Cigarettes  . Smokeless tobacco: Not on file  . Alcohol Use: No   OB History    No data available     Review of Systems  Constitutional: Negative for fever and chills.  Respiratory: Negative for shortness of breath.   Cardiovascular: Negative for chest pain.  Gastrointestinal: Negative for abdominal pain.  Genitourinary: Positive for vaginal bleeding. Negative for pelvic pain.  Neurological: Negative for headaches.      Allergies  Ibuprofen  Home Medications   Prior to Admission medications   Medication Sig Start Date End Date Taking? Authorizing Provider  ARIPiprazole (ABILIFY IM) Inject 1 application into the muscle every 30 (thirty) days.    Historical Provider, MD  cyclobenzaprine (FLEXERIL) 5 MG tablet Take 1 tablet (5 mg total) by mouth 3 (three) times daily as needed for muscle spasms. 06/22/13   Arthor Captain, PA-C  HYDROcodone-acetaminophen (NORCO) 5-325 MG per tablet Take 1 tablet by mouth every 6 (six) hours as needed. 06/22/13   Arthor Captain, PA-C  methocarbamol (ROBAXIN) 500 MG tablet  Take 1 tablet (500 mg total) by mouth 2 (two) times daily. 07/03/12   Garlon Hatchet, PA-C  temazepam (RESTORIL) 15 MG capsule Take 15 mg by mouth at bedtime as needed for sleep.    Historical Provider, MD   BP 105/60 mmHg  Pulse 58  Temp(Src) 98.4 F (36.9 C) (Oral)  Resp 17  Ht  (1.727 m)  Wt 232 lb (105.235 kg)  BMI 35.28 kg/m2  SpO2 99%  LMP 07/08/2014 Physical Exam  Constitutional: She appears well-developed. No distress.  HENT:  Head: Normocephalic and atraumatic.  Eyes: Pupils are equal, round, and reactive to light. Right eye exhibits no discharge. Left eye exhibits no discharge.  Neck: Normal range of motion. Neck supple.  Cardiovascular: Normal rate.   Pulmonary/Chest: Effort normal. No respiratory distress. She has no wheezes. She has no rales.  Abdominal: Soft. There is no tenderness.  Genitourinary: There is bleeding (dark red, no active bleeding) in the vagina.  Skin: She is not diaphoretic.    ED Course  Procedures (including critical care time) Labs Review Labs Reviewed  PREGNANCY, URINE  GC/CHLAMYDIA PROBE AMP ([facility])    Imaging Review No results found.   EKG Interpretation None      MDM   Final diagnoses:  None    34 y/o female w/ PMH fibroids who presents with vaginal bleeding since 2/24. Passing nickel sized clots, and using approx 5 pads.  Previous tubal ligation.  Exam as above, dark  blood in the vaginal vault.  VSS, no tachycardic.  No orthostatic sx at this time, feel no need for labs.  Patient w/ known fibroids, likely contributing to her vaginal bleeding.  Will have her follow up with her primary care doctor.  Gc/chl sent.  Will have her return to the ED if vaginal bleeding worsens of if she has any other concerns.      Silas FloodErik Vidyuth Belsito, MD 07/23/14 1951  Azalia BilisKevin Campos, MD 07/24/14 [phone]

## 2014-07-23 NOTE — Discharge Instructions (Signed)
Abnormal Uterine Bleeding Abnormal uterine bleeding can affect women at various stages in life, including teenagers, women in their reproductive years, pregnant women, and women who have reached menopause. Several kinds of uterine bleeding are considered abnormal, including:  Bleeding or spotting between periods.   Bleeding after sexual intercourse.   Bleeding that is heavier or more than normal.   Periods that last longer than usual.  Bleeding after menopause.  Many cases of abnormal uterine bleeding are minor and simple to treat, while others are more serious. Any type of abnormal bleeding should be evaluated by your health care provider. Treatment will depend on the cause of the bleeding. HOME CARE INSTRUCTIONS Monitor your condition for any changes. The following actions may help to alleviate any discomfort you are experiencing:  Avoid the use of tampons and douches as directed by your health care provider.  Change your pads frequently. You should get regular pelvic exams and Pap tests. Keep all follow-up appointments for diagnostic tests as directed by your health care provider.  SEEK MEDICAL CARE IF:   Your bleeding lasts more than 1 week.   You feel dizzy at times.  SEEK IMMEDIATE MEDICAL CARE IF:   You pass out.   You are changing pads every 15 to 30 minutes.   You have abdominal pain.  You have a fever.   You become sweaty or weak.   You are passing large blood clots from the vagina.   You start to feel nauseous and vomit. MAKE SURE YOU:   Understand these instructions.  Will watch your condition.  Will get help right away if you are not doing well or get worse. Document Released: 05/01/2005 Document Revised: 05/06/2013 Document Reviewed: 11/28/2012 ExitCare Patient Information 2015 [employer]. This information is not intended to replace advice given to you by your health care provider. Make sure you discuss any questions you have with your  health care provider.  

## 2014-07-24 LAB — GC/CHLAMYDIA PROBE AMP (~~LOC~~) NOT AT ARMC
Chlamydia: NEGATIVE
NEISSERIA GONORRHEA: NEGATIVE

## 2014-12-01 ENCOUNTER — Encounter (HOSPITAL_COMMUNITY): Payer: Self-pay | Admitting: Emergency Medicine

## 2014-12-01 ENCOUNTER — Emergency Department (HOSPITAL_COMMUNITY)
Admission: EM | Admit: 2014-12-01 | Discharge: 2014-12-01 | Disposition: A | Discharge status: Home / Self Care | Payer: Medicaid Other | Attending: Emergency Medicine | Admitting: Emergency Medicine

## 2014-12-01 DIAGNOSIS — Z720 Tobacco use: Secondary | ICD-10-CM | POA: Insufficient documentation

## 2014-12-01 DIAGNOSIS — K529 Noninfective gastroenteritis and colitis, unspecified: Secondary | ICD-10-CM | POA: Diagnosis not present

## 2014-12-01 DIAGNOSIS — J029 Acute pharyngitis, unspecified: Secondary | ICD-10-CM | POA: Diagnosis not present

## 2014-12-01 DIAGNOSIS — R630 Anorexia: Secondary | ICD-10-CM | POA: Insufficient documentation

## 2014-12-01 DIAGNOSIS — Z3202 Encounter for pregnancy test, result negative: Secondary | ICD-10-CM | POA: Diagnosis not present

## 2014-12-01 DIAGNOSIS — R05 Cough: Secondary | ICD-10-CM | POA: Diagnosis not present

## 2014-12-01 DIAGNOSIS — Z9851 Tubal ligation status: Secondary | ICD-10-CM | POA: Insufficient documentation

## 2014-12-01 DIAGNOSIS — Z79899 Other long term (current) drug therapy: Secondary | ICD-10-CM | POA: Insufficient documentation

## 2014-12-01 DIAGNOSIS — F319 Bipolar disorder, unspecified: Secondary | ICD-10-CM | POA: Insufficient documentation

## 2014-12-01 DIAGNOSIS — F209 Schizophrenia, unspecified: Secondary | ICD-10-CM | POA: Insufficient documentation

## 2014-12-01 DIAGNOSIS — Z87440 Personal history of urinary (tract) infections: Secondary | ICD-10-CM | POA: Diagnosis not present

## 2014-12-01 DIAGNOSIS — R1031 Right lower quadrant pain: Secondary | ICD-10-CM | POA: Diagnosis present

## 2014-12-01 LAB — COMPREHENSIVE METABOLIC PANEL
ALT: 12 U/L — ABNORMAL LOW (ref 14–54)
AST: 19 U/L (ref 15–41)
Albumin: 3.8 g/dL (ref 3.5–5.0)
Alkaline Phosphatase: 58 U/L (ref 38–126)
Anion gap: 6 (ref 5–15)
BILIRUBIN TOTAL: 0.8 mg/dL (ref 0.3–1.2)
BUN: <5 mg/dL — ABNORMAL LOW (ref 6–20)
CALCIUM: 8.8 mg/dL — AB (ref 8.9–10.3)
CHLORIDE: 108 mmol/L (ref 101–111)
CO2: 26 mmol/L (ref 22–32)
CREATININE: 1.1 mg/dL — AB (ref 0.44–1.00)
GFR calc Af Amer: >60 mL/min (ref >60)
Glucose, Bld: 95 mg/dL (ref 65–99)
Potassium: 3.6 mmol/L (ref 3.5–5.1)
SODIUM: 140 mmol/L (ref 135–145)
TOTAL PROTEIN: 6.7 g/dL (ref 6.5–8.1)

## 2014-12-01 LAB — CBC
HCT: 36.2 % (ref 36.0–46.0)
Hemoglobin: 12.4 g/dL (ref 12.0–15.0)
MCH: 30 pg (ref 26.0–34.0)
MCHC: 34.3 g/dL (ref 30.0–36.0)
MCV: 87.4 fL (ref 78.0–100.0)
PLATELETS: 287 10*3/uL (ref 150–400)
RBC: 4.14 MIL/uL (ref 3.87–5.11)
RDW: 13.8 % (ref 11.5–15.5)
WBC: 8.6 10*3/uL (ref 4.0–10.5)

## 2014-12-01 LAB — URINALYSIS, ROUTINE W REFLEX MICROSCOPIC
BILIRUBIN URINE: NEGATIVE
GLUCOSE, UA: NEGATIVE mg/dL
Ketones, ur: NEGATIVE mg/dL
Leukocytes, UA: NEGATIVE
NITRITE: NEGATIVE
PROTEIN: NEGATIVE mg/dL
Specific Gravity, Urine: 1.003 — ABNORMAL LOW (ref 1.005–1.030)
Urobilinogen, UA: 0.2 mg/dL (ref 0.0–1.0)
pH: 6 (ref 5.0–8.0)

## 2014-12-01 LAB — URINE MICROSCOPIC-ADD ON

## 2014-12-01 LAB — POC URINE PREG, ED: PREG TEST UR: NEGATIVE

## 2014-12-01 MED ORDER — ONDANSETRON 4 MG PO TBDP
4.0000 mg | ORAL_TABLET | Freq: Three times a day (TID) | ORAL | Status: DC | PRN
Start: 2014-12-01 — End: 2016-02-01

## 2014-12-01 MED ORDER — SODIUM CHLORIDE 0.9 % IV BOLUS (SEPSIS)
1000.0000 mL | Freq: Once | INTRAVENOUS | Status: AC
  Administered 2014-12-01: 1000 mL via INTRAVENOUS

## 2014-12-01 MED ORDER — ONDANSETRON HCL 4 MG/2ML IJ SOLN
4.0000 mg | Freq: Once | INTRAMUSCULAR | Status: AC
  Administered 2014-12-01: 4 mg via INTRAVENOUS
  Filled 2014-12-01: qty 2

## 2014-12-01 MED ORDER — DICYCLOMINE HCL 10 MG/ML IM SOLN
20.0000 mg | Freq: Once | INTRAMUSCULAR | Status: AC
  Administered 2014-12-01: 20 mg via INTRAMUSCULAR
  Filled 2014-12-01: qty 2

## 2014-12-01 NOTE — ED Notes (Signed)
PT ambulated with baseline gait; VSS; A&Ox3; no signs of distress; respirations even and unlabored; skin warm and dry; no questions upon discharge.  

## 2014-12-01 NOTE — Discharge Instructions (Signed)
Viral Gastroenteritis Viral gastroenteritis is also called stomach flu. This illness is caused by a certain type of germ (virus). It can cause sudden watery poop (diarrhea) and throwing up (vomiting). This can cause you to lose body fluids (dehydration). This illness usually lasts for 3 to 8 days. It usually goes away on its own. HOME CARE   Drink enough fluids to keep your pee (urine) clear or pale yellow. Drink small amounts of fluids often.  Ask your doctor how to replace body fluid losses (rehydration).  Avoid:  Foods high in sugar.  Alcohol.  Bubbly (carbonated) drinks.  Tobacco.  Juice.  Caffeine drinks.  Very hot or cold fluids.  Fatty, greasy foods.  Eating too much at one time.  Dairy products until 24 to 48 hours after your watery poop stops.  You may eat foods with active cultures (probiotics). They can be found in some yogurts and supplements.  Wash your hands well to avoid spreading the illness.  Only take medicines as told by your doctor. Do not give aspirin to children. Do not take medicines for watery poop (antidiarrheals).  Ask your doctor if you should keep taking your regular medicines.  Keep all doctor visits as told. GET HELP RIGHT AWAY IF:   You cannot keep fluids down.  You do not pee at least once every 6 to 8 hours.  You are short of breath.  You see blood in your poop or throw up. This may look like coffee grounds.  You have belly (abdominal) pain that gets worse or is just in one small spot (localized).  You keep throwing up or having watery poop.  You have a fever.  The patient is a child younger than 3 months, and he or she has a fever.  The patient is a child older than 3 months, and he or she has a fever and problems that do not go away.  The patient is a child older than 3 months, and he or she has a fever and problems that suddenly get worse.  The patient is a baby, and he or she has no tears when crying. MAKE SURE YOU:     Understand these instructions.  Will watch your condition.  Will get help right away if you are not doing well or get worse. Document Released: 10/18/2007 Document Revised: 07/24/2011 Document Reviewed: 02/15/2011 ExitCare Patient Information 2015 [employer]. This information is not intended to replace advice given to you by your health care provider. Make sure you discuss any questions you have with your health care provider.  

## 2014-12-01 NOTE — ED Provider Notes (Signed)
CSN: [id number]     Arrival date & time 12/01/14  1017 History   First MD Initiated Contact with Patient 12/01/14 1018     No chief complaint on file.    (Consider location/radiation/quality/duration/timing/severity/associated sxs/prior Treatment) Patient is a 34 y.o. female presenting with abdominal pain.  Abdominal Pain Pain location:  RLQ and LLQ Pain quality: cramping   Pain radiates to:  Back Pain severity now: 5 out of 10. Onset quality:  Gradual Duration:  2 days Timing:  Constant Progression:  Unchanged Context: recent illness   Context: not alcohol use, not recent travel, not sick contacts, not suspicious food intake and not trauma   Relieved by:  Lying down Associated symptoms: anorexia, cough, diarrhea, flatus, nausea and sore throat   Associated symptoms: no chest pain, no chills, no constipation, no dysuria, no fever, no hematochezia, no hematuria, no shortness of breath and no vomiting   Risk factors: no alcohol abuse, no aspirin use and no NSAID use   Risk factors comment:  LMP started on 7/15 and is still on her current menstrual period   Past Medical History  Diagnosis Date  . Bipolar disorder   . Schizophrenia   . UTI (lower urinary tract infection)    Past Surgical History  Procedure Laterality Date  . Tubal ligation     Family History  Problem Relation Age of Onset  . Diabetes Mother   . Kidney cancer Father    History  Substance Use Topics  . Smoking status: Current Every Day Smoker -- 1.50 packs/day    Types: Cigarettes  . Smokeless tobacco: Not on file  . Alcohol Use: No   OB History    No data available     Review of Systems  Constitutional: Negative for fever, chills and diaphoresis.  HENT: Positive for sore throat. Negative for ear pain and sinus pressure.   Eyes: Negative for visual disturbance.  Respiratory: Positive for cough. Negative for shortness of breath and wheezing.   Cardiovascular: Negative for chest pain, palpitations  and leg swelling.  Gastrointestinal: Positive for nausea, abdominal pain, diarrhea, anorexia and flatus. Negative for vomiting, constipation, blood in stool and hematochezia.  Endocrine: Negative for polyuria.  Genitourinary: Negative for dysuria, urgency, frequency, hematuria, flank pain, decreased urine volume and difficulty urinating.  Musculoskeletal: Negative for back pain and gait problem.  Skin: Negative for rash.  Neurological: Negative for dizziness, syncope, weakness, numbness and headaches.  Hematological: Does not bruise/bleed easily.  Psychiatric/Behavioral: Negative for confusion and agitation.  All other systems reviewed and are negative.     Allergies  Ibuprofen  Home Medications   Prior to Admission medications   Medication Sig Start Date End Date Taking? Authorizing Provider  ARIPiprazole (ABILIFY IM) Inject 1 application into the muscle every 30 (thirty) days.   Yes Historical Provider, MD  Linaclotide (LINZESS PO) Take 1 tablet by mouth daily as needed (diarrhea).   Yes Historical Provider, MD  PROAIR HFA 108 (90 BASE) MCG/ACT inhaler Inhale 2 puffs into the lungs 4 (four) times daily as needed for wheezing or shortness of breath.  10/07/14  Yes Historical Provider, MD  cyclobenzaprine (FLEXERIL) 5 MG tablet Take 1 tablet (5 mg total) by mouth 3 (three) times daily as needed for muscle spasms. Patient not taking: Reported on 12/01/2014 06/22/13   Arthor Captain, PA-C  HYDROcodone-acetaminophen (NORCO) 5-325 MG per tablet Take 1 tablet by mouth every 6 (six) hours as needed. Patient not taking: Reported on 12/01/2014 06/22/13   Cammy Copa  Harris, PA-C  methocarbamol (ROBAXIN) 500 MG tablet Take 1 tablet (500 mg total) by mouth 2 (two) times daily. Patient not taking: Reported on 12/01/2014 07/03/12   Garlon HatchetLisa M Sanders, PA-C   BP 103/53 mmHg  Pulse 62  Temp(Src) 98.3 F (36.8 C) (Oral)  Resp 17  SpO2 100%  LMP 11/30/2014 Physical Exam  Constitutional: She is oriented to  person, place, and time. She appears well-nourished. No distress.  HENT:  Head: Normocephalic and atraumatic.  Right Ear: External ear normal.  Left Ear: External ear normal.  Eyes: Conjunctivae are normal. Pupils are equal, round, and reactive to light.  Neck: Normal range of motion. Neck supple.  Cardiovascular: Normal rate, regular rhythm, normal heart sounds and intact distal pulses.  Exam reveals no gallop and no friction rub.   No murmur heard. Pulmonary/Chest: Effort normal and breath sounds normal. She has no wheezes. She has no rales.  Abdominal: Soft. Bowel sounds are normal. She exhibits no distension and no mass. There is no tenderness. There is no rebound and no guarding.  Hyperactive bowel sounds. Patient points to crampiness in lower abdomen but is non-tender to palpation and says it 'actually makes it feel better'   Musculoskeletal: Normal range of motion. She exhibits no edema or tenderness.  Neurological: She is alert and oriented to person, place, and time. She has normal reflexes. No cranial nerve deficit. Coordination normal.  Skin: Skin is warm. No rash noted. She is not diaphoretic. No erythema.  Psychiatric: She has a normal mood and affect. Her behavior is normal.  Nursing note and vitals reviewed.   ED Course  Procedures (including critical care time) Labs Review Labs Reviewed  COMPREHENSIVE METABOLIC PANEL - Abnormal; Notable for the following:    BUN <5 (*)    Creatinine, Ser 1.10 (*)    Calcium 8.8 (*)    ALT 12 (*)    All other components within normal limits  CBC  URINALYSIS, ROUTINE W REFLEX MICROSCOPIC (NOT AT [facility])  POC URINE PREG, ED    Imaging Review No results found.   EKG Interpretation None      MDM   Final diagnoses:  None    Mrs. Amadou is a 34yo w PMH of BPD, schizophrenia, and fibroids who presents with 3 day history of non-bloody diarrhea, crampy 5/10 lower abdominal pain radiating to the lower back, nausea and increased  flatus without fever or any other associated symptoms. She has taken OTC 'anti-diarrhea', which has stopped her diarrhea and now only the crampy abdominal pain persists along with increased flatus. CBC and CMP are unremarkable for causes of her current symptoms. We have given zofran 4mg  IV for her nausea and bentyl 20mg  IV for her crampy abdominal pain as well as 1L NS fluid bolus, to which she has responded well. Her current symptoms are most likely due to viral gastroenteritis, and we believe she is currently safe for discharge home.    Darrick HuntsmanWilliam R Mignonne Afonso, MD 12/01/14 1309  Purvis SheffieldForrest Harrison, MD 12/01/14 [phone]

## 2014-12-01 NOTE — ED Notes (Signed)
PT reports cough for week and throat pain due to post nasal drip; anorexia for several days; lower abdominal pain; nausea; no current emesis or diarr. Has been vomiting and having diarr for 3 days.

## 2014-12-03 LAB — URINE CULTURE: Culture: >=100000

## 2014-12-04 ENCOUNTER — Telehealth (HOSPITAL_COMMUNITY): Payer: Self-pay

## 2014-12-04 NOTE — ED Notes (Signed)
Post ED Visit - Positive Culture Follow-up  Culture report reviewed by antimicrobial stewardship pharmacist:  Wes Dulaney, Pharm.D., BCPS  Celedonio Miyamoto, [address].D., BCPS  Georgina Pillion, Pharm.D., BCPS  [place], [place].D., BCPS, AAHIVP  Estella Husk, Pharm.D., BCPS, AAHIVP  Elder Cyphers, [address].D., BCPS  Positive urine culture Per Sharilyn Sites PA  no further patient follow-up is required at this time.  Ashley Jacobs 12/04/2014, 8:47 AM

## 2015-05-20 ENCOUNTER — Emergency Department (HOSPITAL_COMMUNITY)
Admission: EM | Admit: 2015-05-20 | Discharge: 2015-05-21 | Disposition: A | Discharge status: Home / Self Care | Payer: Medicaid Other | Attending: Emergency Medicine | Admitting: Emergency Medicine

## 2015-05-20 ENCOUNTER — Emergency Department (HOSPITAL_COMMUNITY)
Admission: EM | Admit: 2015-05-20 | Discharge: 2015-05-20 | Discharge status: Against Medical Advice | Payer: Medicaid Other | Source: Home / Self Care

## 2015-05-20 ENCOUNTER — Encounter (HOSPITAL_COMMUNITY): Payer: Self-pay | Admitting: *Deleted

## 2015-05-20 DIAGNOSIS — M546 Pain in thoracic spine: Secondary | ICD-10-CM

## 2015-05-20 DIAGNOSIS — F209 Schizophrenia, unspecified: Secondary | ICD-10-CM | POA: Diagnosis not present

## 2015-05-20 DIAGNOSIS — Y9289 Other specified places as the place of occurrence of the external cause: Secondary | ICD-10-CM | POA: Insufficient documentation

## 2015-05-20 DIAGNOSIS — F17210 Nicotine dependence, cigarettes, uncomplicated: Secondary | ICD-10-CM

## 2015-05-20 DIAGNOSIS — F319 Bipolar disorder, unspecified: Secondary | ICD-10-CM | POA: Diagnosis not present

## 2015-05-20 DIAGNOSIS — Z79899 Other long term (current) drug therapy: Secondary | ICD-10-CM | POA: Diagnosis not present

## 2015-05-20 DIAGNOSIS — S46811A Strain of other muscles, fascia and tendons at shoulder and upper arm level, right arm, initial encounter: Secondary | ICD-10-CM | POA: Insufficient documentation

## 2015-05-20 DIAGNOSIS — Y998 Other external cause status: Secondary | ICD-10-CM | POA: Diagnosis not present

## 2015-05-20 DIAGNOSIS — Z87440 Personal history of urinary (tract) infections: Secondary | ICD-10-CM | POA: Diagnosis not present

## 2015-05-20 DIAGNOSIS — M25511 Pain in right shoulder: Secondary | ICD-10-CM

## 2015-05-20 DIAGNOSIS — X58XXXA Exposure to other specified factors, initial encounter: Secondary | ICD-10-CM | POA: Insufficient documentation

## 2015-05-20 DIAGNOSIS — S46911A Strain of unspecified muscle, fascia and tendon at shoulder and upper arm level, right arm, initial encounter: Secondary | ICD-10-CM

## 2015-05-20 DIAGNOSIS — Y9389 Activity, other specified: Secondary | ICD-10-CM | POA: Insufficient documentation
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
## 2015-05-20 NOTE — ED Notes (Signed)
Called pt to go back to a room; unable to locate

## 2015-05-21 ENCOUNTER — Encounter (HOSPITAL_COMMUNITY): Payer: Self-pay | Admitting: Emergency Medicine

## 2015-05-21 MED ORDER — METHOCARBAMOL 500 MG PO TABS: 500.0000 mg | ORAL_TABLET | Freq: Four times a day (QID) | ORAL | Status: DC

## 2015-05-21 NOTE — ED Notes (Signed)
Pt complains of right sided shoulder pain that started over a week ago.

## 2015-05-21 NOTE — Discharge Instructions (Signed)

## 2015-05-21 NOTE — ED Provider Notes (Signed)
CSN: [id number]     Arrival date & time 05/20/15  2340 History   First MD Initiated Contact with Patient 05/21/15 0053     Chief Complaint  Patient presents with  . Shoulder Pain     (Consider location/radiation/quality/duration/timing/severity/associated sxs/prior Treatment) Patient is a 35 y.o. female presenting with shoulder pain. The history is provided by the patient. No language interpreter was used.  Shoulder Pain Location:  Shoulder Time since incident:  1 week Injury: no   Shoulder location:  R shoulder Pain details:    Quality:  Aching   Radiates to:  Does not radiate   Severity:  Moderate   Onset quality:  Gradual   Timing:  Constant   Progression:  Worsening Chronicity:  New Dislocation: no   Foreign body present:  No foreign bodies Relieved by:  Nothing Worsened by:  Nothing tried Ineffective treatments:  None tried Associated symptoms: no neck pain, no swelling and no tingling     Past Medical History  Diagnosis Date  . Bipolar disorder (HCC)   . Schizophrenia (HCC)   . UTI (lower urinary tract infection)    Past Surgical History  Procedure Laterality Date  . Tubal ligation     Family History  Problem Relation Age of Onset  . Diabetes Mother   . Kidney cancer Father    Social History  Substance Use Topics  . Smoking status: Current Every Day Smoker -- 1.50 packs/day    Types: Cigarettes  . Smokeless tobacco: None  . Alcohol Use: No   OB History    No data available     Review of Systems  Musculoskeletal: Negative for joint swelling and neck pain.  All other systems reviewed and are negative.     Allergies  Ibuprofen  Home Medications   Prior to Admission medications   Medication Sig Start Date End Date Taking? Authorizing Provider  ARIPiprazole (ABILIFY IM) Inject 1 application into the muscle every 30 (thirty) days.    Historical Provider, MD  HYDROcodone-acetaminophen (NORCO) 5-325 MG per tablet Take 1 tablet by mouth every 6  (six) hours as needed. Patient not taking: Reported on 12/01/2014 06/22/13   Arthor Captain, PA-C  Linaclotide (LINZESS PO) Take 1 tablet by mouth daily as needed (diarrhea).    Historical Provider, MD  methocarbamol (ROBAXIN) 500 MG tablet Take 1 tablet (500 mg total) by mouth 4 (four) times daily. 05/21/15   Elson Areas, PA-C  ondansetron (ZOFRAN ODT) 4 MG disintegrating tablet Take 1 tablet (4 mg total) by mouth every 8 (eight) hours as needed for nausea or vomiting. 12/01/14   Darrick Huntsman, MD  PROAIR HFA 108 (90 BASE) MCG/ACT inhaler Inhale 2 puffs into the lungs 4 (four) times daily as needed for wheezing or shortness of breath.  10/07/14   Historical Provider, MD   BP 104/70 mmHg  Pulse 74  Temp(Src) 98.2 F (36.8 C) (Oral)  Resp 16  Ht 5\' 8"  (1.727 m)  Wt 94.802 kg  BMI 31.79 kg/m2  SpO2 100%  LMP 05/19/2015 Physical Exam  Constitutional: She appears well-developed and well-nourished.  HENT:  Head: Normocephalic.  Right Ear: External ear normal.  Left Ear: External ear normal.  Eyes: Pupils are equal, round, and reactive to light.  Neck: Normal range of motion.  Cardiovascular: Normal rate.   Pulmonary/Chest: Effort normal.  Musculoskeletal:  Tender right shoulder and right trapezius muscle.  No deformity,  From,  Ns and nv intact  Neurological: She is alert.  Skin: Skin is warm.  Psychiatric: She has a normal mood and affect.  Vitals reviewed.   ED Course  Procedures (including critical care time) Labs Review Labs Reviewed - No data to display  Imaging Review No results found. I have personally reviewed and evaluated these images and lab results as part of my medical decision-making.   EKG Interpretation None      MDM   Final diagnoses:  Shoulder strain, right, initial encounter    Meds ordered this encounter  Medications  . methocarbamol (ROBAXIN) 500 MG tablet    Sig: Take 1 tablet (500 mg total) by mouth 4 (four) times daily.    Dispense:  28  tablet    Refill:  0    Order Specific Question:  Supervising Provider    Answer:  Eber HongMILLER, BRIAN [[id number]]    An After Visit Summary was printed and given to the patient.  Lonia SkinnerLeslie K ToppersSofia, PA-C 05/21/15 1744  Derwood KaplanAnkit Nanavati, MD 05/21/15 [phone]

## 2015-05-23 ENCOUNTER — Emergency Department (HOSPITAL_COMMUNITY): Payer: Medicaid Other

## 2015-05-23 ENCOUNTER — Emergency Department (HOSPITAL_COMMUNITY)
Admission: EM | Admit: 2015-05-23 | Discharge: 2015-05-23 | Disposition: A | Discharge status: Home / Self Care | Payer: Medicaid Other | Attending: Emergency Medicine | Admitting: Emergency Medicine

## 2015-05-23 ENCOUNTER — Encounter (HOSPITAL_COMMUNITY): Payer: Self-pay | Admitting: *Deleted

## 2015-05-23 DIAGNOSIS — M5412 Radiculopathy, cervical region: Secondary | ICD-10-CM | POA: Insufficient documentation

## 2015-05-23 DIAGNOSIS — Z87440 Personal history of urinary (tract) infections: Secondary | ICD-10-CM | POA: Diagnosis not present

## 2015-05-23 DIAGNOSIS — Z79899 Other long term (current) drug therapy: Secondary | ICD-10-CM | POA: Diagnosis not present

## 2015-05-23 DIAGNOSIS — M542 Cervicalgia: Secondary | ICD-10-CM | POA: Diagnosis present

## 2015-05-23 DIAGNOSIS — F319 Bipolar disorder, unspecified: Secondary | ICD-10-CM | POA: Diagnosis not present

## 2015-05-23 DIAGNOSIS — F209 Schizophrenia, unspecified: Secondary | ICD-10-CM | POA: Diagnosis not present

## 2015-05-23 DIAGNOSIS — T1490XA Injury, unspecified, initial encounter: Secondary | ICD-10-CM

## 2015-05-23 DIAGNOSIS — Z3202 Encounter for pregnancy test, result negative: Secondary | ICD-10-CM | POA: Diagnosis not present

## 2015-05-23 LAB — CBC WITH DIFFERENTIAL/PLATELET
BASOS PCT: 0 %
Basophils Absolute: 0 10*3/uL (ref 0.0–0.1)
Eosinophils Absolute: 0.1 10*3/uL (ref 0.0–0.7)
Eosinophils Relative: 1 %
HCT: 35.8 % — ABNORMAL LOW (ref 36.0–46.0)
HEMOGLOBIN: 12.1 g/dL (ref 12.0–15.0)
LYMPHS ABS: 4 10*3/uL (ref 0.7–4.0)
LYMPHS PCT: 38 %
MCH: 29.6 pg (ref 26.0–34.0)
MCHC: 33.8 g/dL (ref 30.0–36.0)
MCV: 87.5 fL (ref 78.0–100.0)
MONOS PCT: 5 %
Monocytes Absolute: 0.5 10*3/uL (ref 0.1–1.0)
NEUTROS PCT: 56 %
Neutro Abs: 5.9 10*3/uL (ref 1.7–7.7)
Platelets: 281 10*3/uL (ref 150–400)
RBC: 4.09 MIL/uL (ref 3.87–5.11)
RDW: 14.1 % (ref 11.5–15.5)
WBC: 10.5 10*3/uL (ref 4.0–10.5)

## 2015-05-23 LAB — I-STAT CHEM 8, ED
BUN: 5 mg/dL — AB (ref 6–20)
CHLORIDE: 107 mmol/L (ref 101–111)
Calcium, Ion: 1.09 mmol/L — ABNORMAL LOW (ref 1.12–1.23)
Creatinine, Ser: 0.9 mg/dL (ref 0.44–1.00)
Glucose, Bld: 129 mg/dL — ABNORMAL HIGH (ref 65–99)
HCT: 38 % (ref 36.0–46.0)
Hemoglobin: 12.9 g/dL (ref 12.0–15.0)
POTASSIUM: 3.4 mmol/L — AB (ref 3.5–5.1)
SODIUM: 142 mmol/L (ref 135–145)
TCO2: 21 mmol/L (ref 0–100)

## 2015-05-23 LAB — I-STAT BETA HCG BLOOD, ED (MC, WL, AP ONLY): I-stat hCG, quantitative: <5 m[IU]/mL (ref <5)

## 2015-05-23 MED ORDER — DEXAMETHASONE SODIUM PHOSPHATE 10 MG/ML IJ SOLN
10.0000 mg | Freq: Once | INTRAMUSCULAR | Status: AC
  Administered 2015-05-23: 10 mg via INTRAVENOUS
  Filled 2015-05-23: qty 1

## 2015-05-23 MED ORDER — OXYCODONE-ACETAMINOPHEN 5-325 MG PO TABS: 2.0000 | ORAL_TABLET | ORAL | Status: DC | PRN

## 2015-05-23 MED ORDER — CYCLOBENZAPRINE HCL 10 MG PO TABS: 10.0000 mg | ORAL_TABLET | Freq: Two times a day (BID) | ORAL | Status: DC | PRN

## 2015-05-23 MED ORDER — SODIUM CHLORIDE 0.9 % IV BOLUS (SEPSIS)
1000.0000 mL | Freq: Once | INTRAVENOUS | Status: AC
  Administered 2015-05-23: 1000 mL via INTRAVENOUS

## 2015-05-23 MED ORDER — MORPHINE SULFATE (PF) 4 MG/ML IV SOLN
6.0000 mg | Freq: Once | INTRAVENOUS | Status: AC
  Administered 2015-05-23: 6 mg via INTRAVENOUS
  Filled 2015-05-23: qty 2

## 2015-05-23 MED ORDER — OXYCODONE-ACETAMINOPHEN 5-325 MG PO TABS
2.0000 | ORAL_TABLET | Freq: Once | ORAL | Status: AC
  Administered 2015-05-23: 2 via ORAL
  Filled 2015-05-23: qty 2

## 2015-05-23 MED ORDER — METHYLPREDNISOLONE 4 MG PO TBPK: ORAL_TABLET | ORAL | Status: DC

## 2015-05-23 MED ORDER — ONDANSETRON HCL 4 MG/2ML IJ SOLN
4.0000 mg | Freq: Once | INTRAMUSCULAR | Status: AC
  Administered 2015-05-23: 4 mg via INTRAVENOUS

## 2015-05-23 MED ORDER — MORPHINE SULFATE (PF) 2 MG/ML IV SOLN
2.0000 mg | Freq: Once | INTRAVENOUS | Status: AC
  Administered 2015-05-23: 2 mg via INTRAVENOUS
  Filled 2015-05-23: qty 1

## 2015-05-23 MED ORDER — ONDANSETRON HCL 4 MG/2ML IJ SOLN
INTRAMUSCULAR | Status: AC
  Filled 2015-05-23: qty 2

## 2015-05-23 MED ORDER — HYDROMORPHONE HCL 1 MG/ML IJ SOLN
1.0000 mg | Freq: Once | INTRAMUSCULAR | Status: AC
  Administered 2015-05-23: 1 mg via INTRAVENOUS
  Filled 2015-05-23: qty 1

## 2015-05-23 NOTE — Discharge Instructions (Signed)
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
## 2015-05-23 NOTE — ED Notes (Signed)
Pt reports rt arm pain radiating from her shoulder down her arm - pt was seen at this facility previously for the same - pain is intermittent and shooting - pt has been taking pain medications and has not helped. Pt states her right second digit is tingling. Extremity is warm to touch, pulses strong, sensation intact however pt states second digit is still tingling and movement intact.

## 2015-05-23 NOTE — ED Notes (Signed)
Patient transported to MRI 

## 2015-05-27 ENCOUNTER — Other Ambulatory Visit: Payer: Self-pay | Admitting: Neurological Surgery

## 2015-05-31 ENCOUNTER — Encounter (HOSPITAL_COMMUNITY): Admission: RE | Payer: Self-pay | Source: Ambulatory Visit

## 2015-05-31 ENCOUNTER — Ambulatory Visit (HOSPITAL_COMMUNITY): Admission: RE | Admit: 2015-05-31 | Payer: Medicaid Other | Source: Ambulatory Visit | Admitting: Neurological Surgery

## 2015-05-31 ENCOUNTER — Other Ambulatory Visit: Payer: Self-pay | Admitting: Neurological Surgery

## 2015-05-31 SURGERY — ANTERIOR CERVICAL DECOMPRESSION/DISCECTOMY FUSION 1 LEVEL
Anesthesia: General

## 2015-06-16 ENCOUNTER — Inpatient Hospital Stay (HOSPITAL_COMMUNITY)
Admission: RE | Admit: 2015-06-16 | Discharge: 2015-06-16 | Disposition: A | Discharge status: Home / Self Care | Payer: Medicaid Other | Source: Ambulatory Visit

## 2015-06-16 NOTE — Pre-Procedure Instructions (Signed)
Tarnisha Steller  06/16/2015      [id number] - [place], [place] - [address] AT CORNER OF [facility] [address] [place] [address] Phone: [phone] Fax: [phone]    Your procedure is scheduled ON  Monday  06/21/15  Report to [facility] Admitting at 900 A.M.  Call this number if you have problems the morning of surgery:  [phone]   Remember:  Do not eat food or drink liquids after midnight.  Take these medicines the morning of surgery with A SIP OF WATER    OXYCODONE IF NEEDED    (STOP ASPIRIN, COUMADIN, PLAVIX, EFFIENT, HERBAL MEDICINES, IBUPROFEN/ ADVIL/ MOTRIN, GOODY POWDERS/ BC'S)   Do not wear jewelry, make-up or nail polish.  Do not wear lotions, powders, or perfumes.  You may wear deodorant.  Do not shave 48 hours prior to surgery.  Men may shave face and neck.  Do not bring valuables to the hospital.  [facility] is not responsible for any belongings or valuables.  Contacts, dentures or bridgework may not be worn into surgery.  Leave your suitcase in the car.  After surgery it may be brought to your room.  For patients admitted to the hospital, discharge time will be determined by your treatment team.  Patients discharged the day of surgery will not be allowed to drive home.   Name and phone number of your driver:    Special instructions:  [facility] - Preparing for Surgery  Before surgery, you can play an important role.  Because skin is not sterile, your skin needs to be as free of germs as possible.  You can reduce the number of germs on you skin by washing with CHG (chlorahexidine gluconate) soap before surgery.  CHG is an antiseptic cleaner which kills germs and bonds with the skin to continue killing germs even after washing.  Please DO NOT use if you have an allergy to CHG or antibacterial soaps.  If your skin becomes reddened/irritated stop using the CHG and inform your nurse when you arrive at Short  Stay.  Do not shave (including legs and underarms) for at least 48 hours prior to the first CHG shower.  You may shave your face.  Please follow these instructions carefully:   1.  Shower with CHG Soap the night before surgery and the                                morning of Surgery.  2.  If you choose to wash your hair, wash your hair first as usual with your       normal shampoo.  3.  After you shampoo, rinse your hair and body thoroughly to remove the                      Shampoo.  4.  Use CHG as you would any other liquid soap.  You can apply chg directly       to the skin and wash gently with scrungie or a clean washcloth.  5.  Apply the CHG Soap to your body ONLY FROM THE NECK DOWN.        Do not use on open wounds or open sores.  Avoid contact with your eyes,       ears, mouth and genitals (private parts).  Wash genitals (private parts)       with  your normal soap.  6.  Wash thoroughly, paying special attention to the area where your surgery        will be performed.  7.  Thoroughly rinse your body with warm water from the neck down.  8.  DO NOT shower/wash with your normal soap after using and rinsing off       the CHG Soap.  9.  Pat yourself dry with a clean towel.            10.  Wear clean pajamas.            11.  Place clean sheets on your bed the night of your first shower and do not        sleep with pets.  Day of Surgery  Do not apply any lotions/deoderants the morning of surgery.  Please wear clean clothes to the hospital/surgery center.    Please read over the following fact sheets that you were given. Pain Booklet, Coughing and Deep Breathing, MRSA Information and Surgical Site Infection Prevention

## 2015-06-18 ENCOUNTER — Encounter (HOSPITAL_COMMUNITY): Payer: Self-pay | Admitting: *Deleted

## 2015-06-18 MED ORDER — CEFAZOLIN SODIUM-DEXTROSE 2-3 GM-% IV SOLR: 2.0000 g | INTRAVENOUS | Status: DC

## 2015-06-18 NOTE — Progress Notes (Signed)
Anesthesia Chart Review:  Pt is a 35 year old female scheduled for C6-7 ACDF on 06/21/2015 with Dr. Bevely Palmer.   Pt did not attend her PAT appointment, and is a same day work up as a result.   PMH includes:  Schizophrenia, bipolar disorder. Current smoker. BMI 32. Pt also reported to PAT RN by phone that she occasionally gets SOB, even at rest, and she attributes her symptoms to her cigarette smoking.   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 I left voicemail for Shanda Bumps in Dr. Mervyn Gay office about recent drug use.   Medications include: albuterol, abilify  Labs will be obtained DOS. I will defer decision to drug screen to assigned anesthesiologist.   PCP is Dr. Concepcion Elk. We have requested last office visit note for review.   Pt will need further assessment DOS by her assigned anesthesiologist.   Rica Mast, FNP-BC [facility]/Anesthesiology Phone: [phone] 06/18/2015 12:56 PM

## 2015-06-20 MED ORDER — CEFAZOLIN SODIUM-DEXTROSE 2-3 GM-% IV SOLR
2.0000 g | INTRAVENOUS | Status: AC
  Administered 2015-06-21: 2 g via INTRAVENOUS
  Filled 2015-06-20: qty 50

## 2015-06-20 MED ORDER — MUPIROCIN 2 % EX OINT
1.0000 "application " | TOPICAL_OINTMENT | Freq: Once | CUTANEOUS | Status: AC
  Administered 2015-06-21: 1 via TOPICAL
  Filled 2015-06-20: qty 22

## 2015-06-21 ENCOUNTER — Encounter (HOSPITAL_COMMUNITY)
Admission: RE | Disposition: A | Discharge status: Home / Self Care | Payer: Self-pay | Source: Ambulatory Visit | Attending: Neurological Surgery

## 2015-06-21 ENCOUNTER — Ambulatory Visit (HOSPITAL_COMMUNITY): Payer: Medicaid Other

## 2015-06-21 ENCOUNTER — Ambulatory Visit (HOSPITAL_COMMUNITY): Payer: Medicaid Other | Admitting: Emergency Medicine

## 2015-06-21 ENCOUNTER — Encounter (HOSPITAL_COMMUNITY): Payer: Self-pay | Admitting: General Practice

## 2015-06-21 ENCOUNTER — Ambulatory Visit (HOSPITAL_COMMUNITY)
Admission: RE | Admit: 2015-06-21 | Discharge: 2015-06-22 | Disposition: A | Discharge status: Home / Self Care | Payer: Medicaid Other | Source: Ambulatory Visit | Attending: Neurological Surgery | Admitting: Neurological Surgery

## 2015-06-21 ENCOUNTER — Ambulatory Visit (HOSPITAL_COMMUNITY): Payer: Medicaid Other | Admitting: Certified Registered Nurse Anesthetist

## 2015-06-21 DIAGNOSIS — Z79899 Other long term (current) drug therapy: Secondary | ICD-10-CM | POA: Insufficient documentation

## 2015-06-21 DIAGNOSIS — Z419 Encounter for procedure for purposes other than remedying health state, unspecified: Secondary | ICD-10-CM

## 2015-06-21 DIAGNOSIS — F319 Bipolar disorder, unspecified: Secondary | ICD-10-CM | POA: Diagnosis not present

## 2015-06-21 DIAGNOSIS — F209 Schizophrenia, unspecified: Secondary | ICD-10-CM | POA: Diagnosis not present

## 2015-06-21 DIAGNOSIS — F17210 Nicotine dependence, cigarettes, uncomplicated: Secondary | ICD-10-CM | POA: Insufficient documentation

## 2015-06-21 DIAGNOSIS — M5412 Radiculopathy, cervical region: Secondary | ICD-10-CM | POA: Diagnosis present

## 2015-06-21 DIAGNOSIS — M4722 Other spondylosis with radiculopathy, cervical region: Secondary | ICD-10-CM | POA: Insufficient documentation

## 2015-06-21 DIAGNOSIS — M50123 Cervical disc disorder at C6-C7 level with radiculopathy: Secondary | ICD-10-CM | POA: Diagnosis not present

## 2015-06-21 HISTORY — DX: Headache, unspecified: R51.9

## 2015-06-21 HISTORY — DX: Headache: R51

## 2015-06-21 HISTORY — PX: ANTERIOR CERVICAL DECOMP/DISCECTOMY FUSION: SHX1161

## 2015-06-21 HISTORY — DX: Reserved for inherently not codable concepts without codable children: IMO0001

## 2015-06-21 LAB — SURGICAL PCR SCREEN
MRSA, PCR: NEGATIVE
Staphylococcus aureus: NEGATIVE

## 2015-06-21 LAB — CBC
HCT: 40.4 % (ref 36.0–46.0)
HEMOGLOBIN: 13.4 g/dL (ref 12.0–15.0)
MCH: 29.5 pg (ref 26.0–34.0)
MCHC: 33.2 g/dL (ref 30.0–36.0)
MCV: 88.8 fL (ref 78.0–100.0)
PLATELETS: 321 10*3/uL (ref 150–400)
RBC: 4.55 MIL/uL (ref 3.87–5.11)
RDW: 14.6 % (ref 11.5–15.5)
WBC: 9.7 10*3/uL (ref 4.0–10.5)

## 2015-06-21 LAB — BASIC METABOLIC PANEL
ANION GAP: 12 (ref 5–15)
BUN: 6 mg/dL (ref 6–20)
CALCIUM: 9.1 mg/dL (ref 8.9–10.3)
CHLORIDE: 106 mmol/L (ref 101–111)
CO2: 26 mmol/L (ref 22–32)
CREATININE: 1.02 mg/dL — AB (ref 0.44–1.00)
GFR calc non Af Amer: >60 mL/min (ref >60)
Glucose, Bld: 96 mg/dL (ref 65–99)
Potassium: 4.1 mmol/L (ref 3.5–5.1)
SODIUM: 144 mmol/L (ref 135–145)

## 2015-06-21 LAB — HCG, SERUM, QUALITATIVE: PREG SERUM: NEGATIVE

## 2015-06-21 SURGERY — ANTERIOR CERVICAL DECOMPRESSION/DISCECTOMY FUSION 1 LEVEL
Anesthesia: General

## 2015-06-21 MED ORDER — CEFAZOLIN SODIUM-DEXTROSE 2-3 GM-% IV SOLR
2.0000 g | Freq: Three times a day (TID) | INTRAVENOUS | Status: AC
  Administered 2015-06-21 – 2015-06-22 (×2): 2 g via INTRAVENOUS
  Filled 2015-06-21 (×2): qty 50

## 2015-06-21 MED ORDER — SODIUM CHLORIDE 0.9% FLUSH: 3.0000 mL | INTRAVENOUS | Status: DC | PRN

## 2015-06-21 MED ORDER — DOCUSATE SODIUM 100 MG PO CAPS
100.0000 mg | ORAL_CAPSULE | Freq: Two times a day (BID) | ORAL | Status: DC
  Administered 2015-06-21: 100 mg via ORAL
  Filled 2015-06-21: qty 1

## 2015-06-21 MED ORDER — LACTATED RINGERS IV SOLN
INTRAVENOUS | Status: DC
  Administered 2015-06-21: 09:00:00 via INTRAVENOUS

## 2015-06-21 MED ORDER — LACTATED RINGERS IV SOLN
INTRAVENOUS | Status: DC | PRN
  Administered 2015-06-21 (×2): via INTRAVENOUS

## 2015-06-21 MED ORDER — SODIUM CHLORIDE 0.9% FLUSH: 3.0000 mL | Freq: Two times a day (BID) | INTRAVENOUS | Status: DC

## 2015-06-21 MED ORDER — HYDROMORPHONE HCL 1 MG/ML IJ SOLN
0.5000 mg | INTRAMUSCULAR | Status: DC | PRN
  Administered 2015-06-21 (×2): 1 mg via INTRAVENOUS
  Filled 2015-06-21 (×2): qty 1

## 2015-06-21 MED ORDER — PROPOFOL 10 MG/ML IV BOLUS
INTRAVENOUS | Status: AC
Start: 2015-06-21 — End: 2015-06-21
  Filled 2015-06-21: qty 20

## 2015-06-21 MED ORDER — MIDAZOLAM HCL 2 MG/2ML IJ SOLN
INTRAMUSCULAR | Status: AC
  Filled 2015-06-21: qty 2

## 2015-06-21 MED ORDER — LIDOCAINE HCL (CARDIAC) 20 MG/ML IV SOLN
INTRAVENOUS | Status: DC | PRN
Start: 2015-06-21 — End: 2015-06-21
  Administered 2015-06-21: 40 mg via INTRAVENOUS
  Administered 2015-06-21: 60 mg via INTRAVENOUS

## 2015-06-21 MED ORDER — 0.9 % SODIUM CHLORIDE (POUR BTL) OPTIME
TOPICAL | Status: DC | PRN
  Administered 2015-06-21: 1000 mL

## 2015-06-21 MED ORDER — ZOLPIDEM TARTRATE 5 MG PO TABS: 5.0000 mg | ORAL_TABLET | Freq: Every evening | ORAL | Status: DC | PRN

## 2015-06-21 MED ORDER — SODIUM CHLORIDE 0.9 % IJ SOLN
INTRAMUSCULAR | Status: AC
  Filled 2015-06-21: qty 10

## 2015-06-21 MED ORDER — ALBUTEROL SULFATE (2.5 MG/3ML) 0.083% IN NEBU: 2.5000 mg | INHALATION_SOLUTION | Freq: Four times a day (QID) | RESPIRATORY_TRACT | Status: DC | PRN

## 2015-06-21 MED ORDER — OXYCODONE-ACETAMINOPHEN 5-325 MG PO TABS
1.0000 | ORAL_TABLET | ORAL | Status: DC | PRN
  Administered 2015-06-21 – 2015-06-22 (×3): 2 via ORAL
  Filled 2015-06-21 (×3): qty 2

## 2015-06-21 MED ORDER — BUPIVACAINE-EPINEPHRINE (PF) 0.5% -1:200000 IJ SOLN
INTRAMUSCULAR | Status: DC | PRN
  Administered 2015-06-21: 10 mL via PERINEURAL

## 2015-06-21 MED ORDER — METHOCARBAMOL 500 MG PO TABS
500.0000 mg | ORAL_TABLET | Freq: Four times a day (QID) | ORAL | Status: DC | PRN
  Administered 2015-06-21: 500 mg via ORAL
  Filled 2015-06-21: qty 1

## 2015-06-21 MED ORDER — FENTANYL CITRATE (PF) 250 MCG/5ML IJ SOLN
INTRAMUSCULAR | Status: AC
  Filled 2015-06-21: qty 5

## 2015-06-21 MED ORDER — DEXAMETHASONE SODIUM PHOSPHATE 4 MG/ML IJ SOLN
2.0000 mg | Freq: Four times a day (QID) | INTRAMUSCULAR | Status: AC
  Administered 2015-06-21 – 2015-06-22 (×3): 2 mg via INTRAVENOUS
  Filled 2015-06-21 (×3): qty 1

## 2015-06-21 MED ORDER — BISACODYL 5 MG PO TBEC: 5.0000 mg | DELAYED_RELEASE_TABLET | Freq: Every day | ORAL | Status: DC | PRN

## 2015-06-21 MED ORDER — KETOROLAC TROMETHAMINE 30 MG/ML IJ SOLN
INTRAMUSCULAR | Status: AC
  Filled 2015-06-21: qty 1

## 2015-06-21 MED ORDER — NEOSTIGMINE METHYLSULFATE 10 MG/10ML IV SOLN
INTRAVENOUS | Status: DC | PRN
  Administered 2015-06-21: 5 mg via INTRAVENOUS

## 2015-06-21 MED ORDER — ONDANSETRON HCL 4 MG/2ML IJ SOLN
INTRAMUSCULAR | Status: DC | PRN
  Administered 2015-06-21: 4 mg via INTRAVENOUS

## 2015-06-21 MED ORDER — THROMBIN 5000 UNITS EX SOLR
CUTANEOUS | Status: DC | PRN
  Administered 2015-06-21 (×2): 5000 [IU] via TOPICAL

## 2015-06-21 MED ORDER — GLYCOPYRROLATE 0.2 MG/ML IJ SOLN
INTRAMUSCULAR | Status: DC | PRN
  Administered 2015-06-21: 0.6 mg via INTRAVENOUS

## 2015-06-21 MED ORDER — FENTANYL CITRATE (PF) 100 MCG/2ML IJ SOLN
INTRAMUSCULAR | Status: AC
  Filled 2015-06-21: qty 2

## 2015-06-21 MED ORDER — BACITRACIN 50000 UNITS IM SOLR
INTRAMUSCULAR | Status: DC | PRN
  Administered 2015-06-21: 14:00:00

## 2015-06-21 MED ORDER — ROCURONIUM BROMIDE 100 MG/10ML IV SOLN
INTRAVENOUS | Status: DC | PRN
  Administered 2015-06-21: 10 mg via INTRAVENOUS
  Administered 2015-06-21: 50 mg via INTRAVENOUS

## 2015-06-21 MED ORDER — DEXTROSE 5 % IV SOLN
500.0000 mg | Freq: Four times a day (QID) | INTRAVENOUS | Status: DC | PRN
  Filled 2015-06-21: qty 5

## 2015-06-21 MED ORDER — MIDAZOLAM HCL 5 MG/5ML IJ SOLN
INTRAMUSCULAR | Status: DC | PRN
  Administered 2015-06-21: 2 mg via INTRAVENOUS

## 2015-06-21 MED ORDER — ONDANSETRON HCL 4 MG/2ML IJ SOLN: 4.0000 mg | INTRAMUSCULAR | Status: DC | PRN

## 2015-06-21 MED ORDER — FENTANYL CITRATE (PF) 100 MCG/2ML IJ SOLN
INTRAMUSCULAR | Status: DC | PRN
  Administered 2015-06-21: 50 ug via INTRAVENOUS
  Administered 2015-06-21: 150 ug via INTRAVENOUS
  Administered 2015-06-21: 50 ug via INTRAVENOUS

## 2015-06-21 MED ORDER — SENNA 8.6 MG PO TABS
1.0000 | ORAL_TABLET | Freq: Two times a day (BID) | ORAL | Status: DC
  Administered 2015-06-21: 8.6 mg via ORAL
  Filled 2015-06-21: qty 1

## 2015-06-21 MED ORDER — DEXAMETHASONE SODIUM PHOSPHATE 10 MG/ML IJ SOLN
INTRAMUSCULAR | Status: DC | PRN
  Administered 2015-06-21: 10 mg via INTRAVENOUS

## 2015-06-21 MED ORDER — SODIUM CHLORIDE 0.9 % IV SOLN: INTRAVENOUS | Status: DC

## 2015-06-21 MED ORDER — THROMBIN 5000 UNITS EX SOLR
CUTANEOUS | Status: DC | PRN
  Administered 2015-06-21: 15:00:00 via TOPICAL

## 2015-06-21 MED ORDER — DEXMEDETOMIDINE HCL IN NACL 200 MCG/50ML IV SOLN
INTRAVENOUS | Status: AC
Start: 2015-06-21 — End: 2015-06-21
  Filled 2015-06-21: qty 100

## 2015-06-21 MED ORDER — FLEET ENEMA 7-19 GM/118ML RE ENEM: 1.0000 | ENEMA | Freq: Once | RECTAL | Status: DC | PRN

## 2015-06-21 MED ORDER — PHENOL 1.4 % MT LIQD
1.0000 | OROMUCOSAL | Status: DC | PRN
Start: 2015-06-21 — End: 2015-06-22

## 2015-06-21 MED ORDER — ACETAMINOPHEN 650 MG RE SUPP: 650.0000 mg | RECTAL | Status: DC | PRN

## 2015-06-21 MED ORDER — HEMOSTATIC AGENTS (NO CHARGE) OPTIME
TOPICAL | Status: DC | PRN
  Administered 2015-06-21: 1 via TOPICAL

## 2015-06-21 MED ORDER — ROCURONIUM BROMIDE 50 MG/5ML IV SOLN
INTRAVENOUS | Status: AC
  Filled 2015-06-21: qty 1

## 2015-06-21 MED ORDER — FENTANYL CITRATE (PF) 100 MCG/2ML IJ SOLN
25.0000 ug | INTRAMUSCULAR | Status: DC | PRN
  Administered 2015-06-21 (×2): 25 ug via INTRAVENOUS

## 2015-06-21 MED ORDER — MENTHOL 3 MG MT LOZG: 1.0000 | LOZENGE | OROMUCOSAL | Status: DC | PRN

## 2015-06-21 MED ORDER — LIDOCAINE-EPINEPHRINE 2 %-1:100000 IJ SOLN
INTRAMUSCULAR | Status: DC | PRN
  Administered 2015-06-21: 10 mL via INTRADERMAL

## 2015-06-21 MED ORDER — ACETAMINOPHEN 325 MG PO TABS: 650.0000 mg | ORAL_TABLET | ORAL | Status: DC | PRN

## 2015-06-21 MED ORDER — PROPOFOL 10 MG/ML IV BOLUS
INTRAVENOUS | Status: DC | PRN
  Administered 2015-06-21: 200 mg via INTRAVENOUS

## 2015-06-21 SURGICAL SUPPLY — 69 items
APPLICATOR CHLORAPREP 3ML ORNG (MISCELLANEOUS) ×3 IMPLANT
BIT DRILL NEURO 2X3.1 SFT TUCH (MISCELLANEOUS) ×1 IMPLANT
BIT DRILL POWER (BIT) ×1 IMPLANT
BLADE SURG 11 STRL SS (BLADE) ×3 IMPLANT
BLADE ULTRA TIP 2M (BLADE) IMPLANT
BONE MATRIX OSTEOCEL PRO SM (Bone Implant) ×3 IMPLANT
BUR MATCHSTICK NEURO 3.0 LAGG (BURR) ×3 IMPLANT
CAGE COROENT 9X13X15 (Cage) ×3 IMPLANT
CANISTER SUCT 3000ML PPV (MISCELLANEOUS) ×3 IMPLANT
DECANTER SPIKE VIAL GLASS SM (MISCELLANEOUS) ×3 IMPLANT
DERMABOND ADVANCED (GAUZE/BANDAGES/DRESSINGS) ×2
DERMABOND ADVANCED .7 DNX12 (GAUZE/BANDAGES/DRESSINGS) ×1 IMPLANT
DRAPE C-ARM 42X72 X-RAY (DRAPES) ×3 IMPLANT
DRAPE C-ARMOR (DRAPES) ×3 IMPLANT
DRAPE LAPAROTOMY 100X72 PEDS (DRAPES) ×3 IMPLANT
DRAPE MICROSCOPE LEICA (MISCELLANEOUS) ×3 IMPLANT
DRAPE POUCH INSTRU U-SHP 10X18 (DRAPES) ×3 IMPLANT
DRAPE PROXIMA HALF (DRAPES) IMPLANT
DRAPE SHEET LG 3/4 BI-LAMINATE (DRAPES) ×6 IMPLANT
DRILL BIT POWER (BIT) ×2
DRILL NEURO 2X3.1 SOFT TOUCH (MISCELLANEOUS) ×3
DRSG OPSITE POSTOP 3X4 (GAUZE/BANDAGES/DRESSINGS) ×3 IMPLANT
DRSG TEGADERM 4X4.75 (GAUZE/BANDAGES/DRESSINGS) ×3 IMPLANT
ELECT COATED BLADE 2.86 ST (ELECTRODE) ×3 IMPLANT
ELECT REM PT RETURN 9FT ADLT (ELECTROSURGICAL) ×3
ELECTRODE REM PT RTRN 9FT ADLT (ELECTROSURGICAL) ×1 IMPLANT
EVACUATOR 1/8 PVC DRAIN (DRAIN) ×3 IMPLANT
GAUZE SPONGE 4X4 12PLY STRL (GAUZE/BANDAGES/DRESSINGS) IMPLANT
GAUZE SPONGE 4X4 16PLY XRAY LF (GAUZE/BANDAGES/DRESSINGS) IMPLANT
GLOVE BIO SURGEON STRL SZ 6.5 (GLOVE) ×4 IMPLANT
GLOVE BIO SURGEONS STRL SZ 6.5 (GLOVE) ×2
GLOVE BIOGEL PI IND STRL 7.5 (GLOVE) ×1 IMPLANT
GLOVE BIOGEL PI INDICATOR 7.5 (GLOVE) ×2
GLOVE EXAM NITRILE LRG STRL (GLOVE) IMPLANT
GLOVE INDICATOR 7.5 STRL GRN (GLOVE) ×3 IMPLANT
GLOVE SS BIOGEL STRL SZ 7 (GLOVE) ×3 IMPLANT
GLOVE SUPERSENSE BIOGEL SZ 7 (GLOVE) ×6
GOWN STRL REUS W/ TWL LRG LVL3 (GOWN DISPOSABLE) ×1 IMPLANT
GOWN STRL REUS W/ TWL XL LVL3 (GOWN DISPOSABLE) ×1 IMPLANT
GOWN STRL REUS W/TWL LRG LVL3 (GOWN DISPOSABLE) ×2
GOWN STRL REUS W/TWL XL LVL3 (GOWN DISPOSABLE) ×2
HEMOSTAT POWDER KIT SURGIFOAM (HEMOSTASIS) ×3 IMPLANT
KIT BASIN OR (CUSTOM PROCEDURE TRAY) ×3 IMPLANT
KIT ROOM TURNOVER OR (KITS) ×3 IMPLANT
MAXCESS-C SCREW, 14MM DISTRACTION ×6 IMPLANT
NEEDLE HYPO 25X1 1.5 SAFETY (NEEDLE) ×3 IMPLANT
NEEDLE SPNL 22GX3.5 QUINCKE BK (NEEDLE) ×3 IMPLANT
NS IRRIG 1000ML POUR BTL (IV SOLUTION) ×3 IMPLANT
PACK LAMINECTOMY NEURO (CUSTOM PROCEDURE TRAY) ×3 IMPLANT
PAD ARMBOARD 7.5X6 YLW CONV (MISCELLANEOUS) ×9 IMPLANT
PATTIES SURGICAL .5X1.5 (GAUZE/BANDAGES/DRESSINGS) ×3 IMPLANT
PATTIES SURGICAL 1X1 (DISPOSABLE) ×3 IMPLANT
PIN DISTRACTION MAXCESS-C 14 (Screw) ×6 IMPLANT
PLATE ARCHON 1-LEVEL 28MM (Plate) ×3 IMPLANT
RUBBERBAND STERILE (MISCELLANEOUS) ×6 IMPLANT
SCREW ARCHON SELFTAP 4.0X15MM (Screw) ×6 IMPLANT
SCREW ARCHON ST VAR 4.0X15MM (Screw) ×4 IMPLANT
SCREW BN 15X4XST VA NS SPN (Screw) ×2 IMPLANT
SPONGE INTESTINAL PEANUT (DISPOSABLE) ×3 IMPLANT
SPONGE SURGIFOAM ABS GEL SZ50 (HEMOSTASIS) ×3 IMPLANT
STAPLER VISISTAT 35W (STAPLE) ×3 IMPLANT
STOCKINETTE 6  STRL (DRAPES) ×2
STOCKINETTE 6 STRL (DRAPES) ×1 IMPLANT
SUT VIC AB 3-0 SH 8-18 (SUTURE) ×6 IMPLANT
TOWEL OR 17X24 6PK STRL BLUE (TOWEL DISPOSABLE) ×3 IMPLANT
TOWEL OR 17X26 10 PK STRL BLUE (TOWEL DISPOSABLE) ×3 IMPLANT
TUBE CONNECTING 12'X1/4 (SUCTIONS) ×1
TUBE CONNECTING 12X1/4 (SUCTIONS) ×2 IMPLANT
WATER STERILE IRR 1000ML POUR (IV SOLUTION) ×3 IMPLANT

## 2015-06-21 NOTE — Transfer of Care (Signed)
Immediate Anesthesia Transfer of Care Note  Patient: Beverly Wong  Procedure(s) Performed: Procedure(s) with comments: Cervical six- seven Anterior Cervical Decompression/ Diskectomy/ Fusion (N/A) - C6-7 Anterior cervical decompression/diskectomy/fusion  Patient Location: PACU  Anesthesia Type:General  Level of Consciousness: awake, oriented, sedated, patient cooperative and responds to stimulation  Airway & Oxygen Therapy: Patient Spontanous Breathing and Patient connected to face mask oxygen  Post-op Assessment: Report given to RN, Post -op Vital signs reviewed and stable, Patient moving all extremities and Patient moving all extremities X 4  Post vital signs: Reviewed and stable  Last Vitals:  Filed Vitals:   06/21/15 0907 06/21/15 1615  BP:    Pulse:    Temp: 36.8 C 36.2 C  Resp:      Complications: No apparent anesthesia complications

## 2015-06-21 NOTE — H&P (Signed)
CC:  No chief complaint on file.   HPI: Beverly Wong is a 35 y.o. female with cervical radiculopathy.  She has not responded to medical management.  She has right arm pain and triceps weakness.  PMH: Past Medical History  Diagnosis Date  . Bipolar disorder (HCC)   . Schizophrenia (HCC)   . UTI (lower urinary tract infection)   . Headache   . Shortness of breath dyspnea     feels short of breath at times even when seated    PSH: Past Surgical History  Procedure Laterality Date  . Tubal ligation      SH: Social History  Substance Use Topics  . Smoking status: Current Every Day Smoker -- 1.00 packs/day for 17 years    Types: Cigarettes  . Smokeless tobacco: None  . Alcohol Use: Yes     Comment: on ocaasion.     MEDS: Prior to Admission medications   Medication Sig Start Date End Date Taking? Authorizing Provider  albuterol (PROVENTIL HFA;VENTOLIN HFA) 108 (90 Base) MCG/ACT inhaler Inhale into the lungs every 6 (six) hours as needed for wheezing or shortness of breath.   Yes Historical Provider, MD  ARIPiprazole (ABILIFY IM) Inject 1 application into the muscle every 30 (thirty) days.   Yes Historical Provider, MD  ondansetron (ZOFRAN ODT) 4 MG disintegrating tablet Take 1 tablet (4 mg total) by mouth every 8 (eight) hours as needed for nausea or vomiting. 12/01/14  Yes Darrick Huntsman, MD  oxyCODONE-acetaminophen (PERCOCET/ROXICET) 5-325 MG tablet Take 2 tablets by mouth every 4 (four) hours as needed for severe pain. 05/23/15  Yes Margarita Grizzle, MD  cyclobenzaprine (FLEXERIL) 10 MG tablet Take 1 tablet (10 mg total) by mouth 2 (two) times daily as needed for muscle spasms. Patient not taking: Reported on 06/14/2015 05/23/15   Margarita Grizzle, MD  methocarbamol (ROBAXIN) 500 MG tablet Take 1 tablet (500 mg total) by mouth 4 (four) times daily. Patient not taking: Reported on 06/14/2015 05/21/15   Elson Areas, PA-C    ALLERGY: Allergies  Allergen Reactions  . Ibuprofen Other  (See Comments)    Can't take it with the Abilify she is on    ROS: ROS  NEUROLOGIC EXAM: Awake, alert, oriented Memory and concentration grossly intact Speech fluent, appropriate CN grossly intact Motor exam: Upper Extremities Deltoid Bicep Tricep Grip  Right 5/5 5/5 4/5 5/5  Left 5/5 5/5 5/5 5/5   Lower Extremity IP Quad PF DF EHL  Right 5/5 5/5 5/5 5/5 5/5  Left 5/5 5/5 5/5 5/5 5/5   Sensation grossly intact to LT  IMAGING: No new imaging  IMPRESSION: - 35 y.o. female with cervical radiculopathy, C6-7 disc herniation.  PLAN: - C6-7 ACDF

## 2015-06-21 NOTE — Anesthesia Procedure Notes (Signed)
Procedure Name: Intubation Date/Time: 06/21/2015 1:54 PM Performed by: Rogelia Boga Pre-anesthesia Checklist: Patient identified, Emergency Drugs available, Suction available, Patient being monitored and Timeout performed Patient Re-evaluated:Patient Re-evaluated prior to inductionOxygen Delivery Method: Circle system utilized Preoxygenation: Pre-oxygenation with 100% oxygen Intubation Type: IV induction Laryngoscope Size: Mac and 4 Grade View: Grade I Tube type: Oral Tube size: 7.5 mm Number of attempts: 1 Airway Equipment and Method: Stylet Placement Confirmation: ETT inserted through vocal cords under direct vision,  positive ETCO2 and breath sounds checked- equal and bilateral Secured at: 22 cm Tube secured with: Tape Dental Injury: Teeth and Oropharynx as per pre-operative assessment

## 2015-06-21 NOTE — Progress Notes (Signed)
No acute events Doing well Strength unchanged from pre-op To floor when room available 

## 2015-06-21 NOTE — Anesthesia Preprocedure Evaluation (Addendum)
Anesthesia Evaluation  Patient identified by MRN, date of birth, ID band Patient awake    Reviewed: Allergy & Precautions  Airway Mallampati: II  TM Distance: >3 FB Neck ROM: Full    Dental  (+) Teeth Intact, Dental Advisory Given   Pulmonary shortness of breath, with exertion and at rest, Current Smoker,    breath sounds clear to auscultation       Cardiovascular  Rhythm:Regular Rate:Normal     Neuro/Psych Bipolar Disorder Schizophrenia    GI/Hepatic   Endo/Other    Renal/GU      Musculoskeletal   Abdominal   Peds  Hematology   Anesthesia Other Findings   Reproductive/Obstetrics                         Anesthesia Physical Anesthesia Plan  ASA: II  Anesthesia Plan: General   Post-op Pain Management:    Induction: Intravenous  Airway Management Planned:   Additional Equipment:   Intra-op Plan:   Post-operative Plan: Extubation in OR  Informed Consent: I have reviewed the patients History and Physical, chart, labs and discussed the procedure including the risks, benefits and alternatives for the proposed anesthesia with the patient or authorized representative who has indicated his/her understanding and acceptance.   Dental advisory given  Plan Discussed with: CRNA, Anesthesiologist and Surgeon  Anesthesia Plan Comments:       Anesthesia Quick Evaluation

## 2015-06-21 NOTE — Anesthesia Postprocedure Evaluation (Signed)
Anesthesia Post Note  Patient: Beverly Wong  Procedure(s) Performed: Procedure(s) (LRB): Cervical six- seven Anterior Cervical Decompression/ Diskectomy/ Fusion (N/A)  Patient location during evaluation: PACU Anesthesia Type: General Level of consciousness: awake, awake and alert and oriented Pain management: pain level controlled Vital Signs Assessment: post-procedure vital signs reviewed and stable Respiratory status: spontaneous breathing    Last Vitals:  Filed Vitals:   06/21/15 1632 06/21/15 1717  BP: 120/65 133/67  Pulse: 84 75  Temp:  36.5 C  Resp: 22 18    Last Pain:  Filed Vitals:   06/21/15 1742  PainSc: 4                  Amyra Vantuyl COKER

## 2015-06-21 NOTE — Brief Op Note (Signed)
06/21/2015  4:17 PM  PATIENT:  Beverly Wong  35 y.o. female  PRE-OPERATIVE DIAGNOSIS:  Cervical radiculopathy  POST-OPERATIVE DIAGNOSIS:  Cervical radiculopathy  PROCEDURE:  Procedure(s) with comments: Cervical six- seven Anterior Cervical Decompression/ Diskectomy/ Fusion (N/A) - C6-7 Anterior cervical decompression/diskectomy/fusion  SURGEON:  Surgeon(s) and Role:    * Loura Halt [place], MD - Primary     PHYSICIAN ASSISTANT:   ASSISTANTS: Tressie Stalker, MD  ANESTHESIA:   Gen. endotracheal  EBL:  Total I/O In: 1000 [I.V.:1000] Out: -   BLOOD ADMINISTERED: None  DRAINS: Medium Hemovac  LOCAL MEDICATIONS USED:  Lidocaine and Marcaine with epinephrine  SPECIMEN:  None  DISPOSITION OF SPECIMEN:  None  COUNTS:  Correct  TOURNIQUET:  * No tourniquets in log *  DICTATION: . pending  PLAN OF CARE: To floor  PATIENT DISPOSITION:  PACU   Delay start of Pharmacological VTE agent (>24hrs) due to surgical blood loss or risk of bleeding: Yes

## 2015-06-22 ENCOUNTER — Encounter (HOSPITAL_COMMUNITY): Payer: Self-pay | Admitting: Neurological Surgery

## 2015-06-22 DIAGNOSIS — M50123 Cervical disc disorder at C6-C7 level with radiculopathy: Secondary | ICD-10-CM | POA: Diagnosis not present

## 2015-06-22 MED ORDER — METHOCARBAMOL 750 MG PO TABS: 750.0000 mg | ORAL_TABLET | Freq: Four times a day (QID) | ORAL | Status: DC | PRN

## 2015-06-22 MED ORDER — DOCUSATE SODIUM 100 MG PO CAPS: 100.0000 mg | ORAL_CAPSULE | Freq: Two times a day (BID) | ORAL | Status: DC

## 2015-06-22 MED ORDER — ONDANSETRON 4 MG PO TBDP
4.0000 mg | ORAL_TABLET | Freq: Once | ORAL | Status: AC
  Administered 2015-06-22: 4 mg via ORAL
  Filled 2015-06-22: qty 1

## 2015-06-22 MED ORDER — OXYCODONE-ACETAMINOPHEN 5-325 MG PO TABS: 1.0000 | ORAL_TABLET | ORAL | Status: DC | PRN

## 2015-06-22 MED ORDER — BISACODYL 5 MG PO TBEC: 5.0000 mg | DELAYED_RELEASE_TABLET | Freq: Every day | ORAL | Status: DC | PRN

## 2015-06-22 NOTE — Op Note (Signed)
06/21/2015  12:27 PM  PATIENT:  Beverly Wong  35 y.o. 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  Patient understood the risks, benefits, and alternatives and potential outcomes and wished to proceed.  PROCEDURE DETAILS: Patient was brought to the operating room placed under general endotracheal anesthesia. Patient was placed in the supine position on the operating room table. The neck was prepped with betadine and chloraprep and draped in a sterile fashion.   A transverse incision was made on the right side of the neck. Dissection was carried down thru the subcutaneous tissue and the platysma was elevated, opened vertically, and undermined with Metzenbaum scissors. Dissection was then carried out thru an avascular plane leaving the sternocleidomastoid, carotid artery, and jugular vein laterally and the trachea and esophagus medially. The ventral aspect of the vertebral column was identified and a localizing x-ray was taken. The C6-7 level was identified. The longus colli muscles were then elevated and the retractor was placed. The annulus was incised and the disc space entered. Discectomy was performed with micro-curettes and pituitary and kerrison rongeurs. I then used the high-speed drill to drill the endplates down to the level of the posterior longitudinal ligament. The operating microscope was draped and brought into the field provided additional magnification, illumination and visualization. Utilizing microsurgical technique, discectomy was continued posteriorly thru the disc space.  Posterior longitudinal ligament was opened with a nerve hook, and then removed along with disc herniation and osteophytes, decompressing the spinal canal and thecal sac. We then continued to remove osteophytic overgrowth and disc material decompressing the neural foramina and exiting nerve roots bilaterally. So by both visualization and palpation we felt we had an adequate decompression of the neural elements. We then measured the height of the intravertebral disc space and selected a 9 millimeter Peek interbody cage packed with morcellized allograft. It was then gently positioned in the intravertebral disc space and countersunk.   I then used a 28 mm plate and placed two variable angle screws into the C6 vertebral body and two fixed angle screws at C7 and locked them into position. The wound was irrigated with bacitracin solution, checked for hemostasis which was established and confirmed. Once meticulous hemostasis was achieved, we then proceeded with closure. The platysma was closed with interrupted 3-0 undyed Vicryl suture, the subcuticular layer was closed with interrupted 3-0 undyed Vicryl suture. The skin edges were approximated with dermabond. The drapes were removed. A sterile dressing was applied. The patient was then awakened from general anesthesia and transferred to the recovery room in stable condition. At the end of the procedure all sponge, needle and instrument counts were correct.  PATIENT DISPOSITION:  PACU then floor   Delay start of Pharmacological VTE agent (>24hrs) due to surgical blood loss or risk of bleeding:  Yes

## 2015-06-22 NOTE — Progress Notes (Signed)
No acute events Pain well controlled AVSS Full strength Incision looks good Discharged today

## 2015-06-22 NOTE — Progress Notes (Signed)
Patient alert and oriented, mae's well, voiding adequate amount of urine, swallowing without difficulty, no c/o pain. Patient discharged home with family. Script and discharged instructions given to patient. Patient and family stated understanding of d/c instructions given and has an appointment with MD. 

## 2015-06-22 NOTE — Evaluation (Signed)
Physical Therapy Evaluation and Discharge Patient Details Name: Beverly Wong MRN: [id number] DOB: 13-Jun-1980 Today's Date: 06/22/2015   History of Present Illness  Pt is a 35 y/o female who presents s/p C6-C7 ACDF on 06/21/15.  Clinical Impression  Patient evaluated by Physical Therapy with no further acute PT needs identified. All education has been completed and the patient has no further questions. At the time of PT eval pt was able to perform transfers and ambulation with modified independence and no AD. See below for any follow-up Physical Therapy or equipment needs. PT is signing off. Thank you for this referral.     Follow Up Recommendations No PT follow up    Equipment Recommendations  None recommended by PT    Recommendations for Other Services       Precautions / Restrictions Precautions Precautions: Fall;Cervical Precaution Comments: Handout provided and precautions reviewed Restrictions Weight Bearing Restrictions: No      Mobility  Bed Mobility Overal bed mobility: Modified Independent                Transfers Overall transfer level: Modified independent Equipment used: None             General transfer comment: Pt reports dizziness initially however no assist required.   Ambulation/Gait Ambulation/Gait assistance: Modified independent (Device/Increase time) Ambulation Distance (Feet): 400 Feet Assistive device: None Gait Pattern/deviations: WFL(Within Functional Limits) Gait velocity: Decreased Gait velocity interpretation: Below normal speed for age/gender    Stairs            Wheelchair Mobility    Modified Rankin (Stroke Patients Only)       Balance Overall balance assessment: No apparent balance deficits (not formally assessed)                                           Pertinent Vitals/Pain Pain Assessment: Faces Faces Pain Scale: Hurts little more Pain Location: Neck Pain Descriptors / Indicators:  Operative site guarding;Aching Pain Intervention(s): Limited activity within patient's tolerance;Monitored during session;Repositioned    Home Living Family/patient expects to be discharged to:: Private residence Living Arrangements: Spouse/significant other Available Help at Discharge: Family;Available 24 hours/day Type of Home: Apartment Home Access: Level entry     Home Layout: One level Home Equipment: None      Prior Function Level of Independence: Independent               Hand Dominance   Dominant Hand: Right    Extremity/Trunk Assessment   Upper Extremity Assessment: RUE deficits/detail RUE Deficits / Details: Some weakness noted consistent with pre-op radicular symptoms         Lower Extremity Assessment: Overall WFL for tasks assessed      Cervical / Trunk Assessment: Other exceptions  Communication   Communication: No difficulties  Cognition Arousal/Alertness: Awake/alert Behavior During Therapy: WFL for tasks assessed/performed Overall Cognitive Status: Within Functional Limits for tasks assessed                      General Comments      Exercises        Assessment/Plan    PT Assessment Patent does not need any further PT services  PT Diagnosis Acute pain   PT Problem List    PT Treatment Interventions     PT Goals (Current goals can be found in the Care Plan section)  Acute Rehab PT Goals PT Goal Formulation: All assessment and education complete, DC therapy    Frequency     Barriers to discharge        Co-evaluation               End of Session   Activity Tolerance: Patient tolerated treatment well Patient left: in chair;with call bell/phone within reach;with family/visitor present Nurse Communication: Mobility status    Functional Assessment Tool Used: Clinical judgement Functional Limitation: Mobility: Walking and moving around Mobility: Walking and Moving Around Current Status (W0981): At least 1  percent but less than 20 percent impaired, limited or restricted Mobility: Walking and Moving Around Goal Status [phone]): At least 1 percent but less than 20 percent impaired, limited or restricted Mobility: Walking and Moving Around Discharge Status [phone]): At least 1 percent but less than 20 percent impaired, limited or restricted    Time: 0738-0753 PT Time Calculation (min) (ACUTE ONLY): 15 min   Charges:   PT Evaluation $PT Eval Moderate Complexity: 1 Procedure     PT G Codes:   PT G-Codes **NOT FOR INPATIENT CLASS** Functional Assessment Tool Used: Clinical judgement Functional Limitation: Mobility: Walking and moving around Mobility: Walking and Moving Around Current Status (O1308): At least 1 percent but less than 20 percent impaired, limited or restricted Mobility: Walking and Moving Around Goal Status [phone]): At least 1 percent but less than 20 percent impaired, limited or restricted Mobility: Walking and Moving Around Discharge Status [phone]): At least 1 percent but less than 20 percent impaired, limited or restricted    Conni Slipper 06/22/2015, 9:38 AM   Conni Slipper, PT, DPT Acute Rehabilitation Services Pager: [phone]

## 2015-06-22 NOTE — Discharge Summary (Signed)
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 She tolerated this well. She had an uneventful postoperative course and was discharged on postoperative day 1 and improved condition.   Discharged Medications: Resume prior medications   Follow up: With me in 2 weeks

## 2016-01-11 ENCOUNTER — Institutional Professional Consult (permissible substitution): Payer: Medicaid Other | Admitting: Neurology

## 2016-02-01 ENCOUNTER — Ambulatory Visit (INDEPENDENT_AMBULATORY_CARE_PROVIDER_SITE_OTHER): Payer: Medicaid Other | Admitting: Neurology

## 2016-02-01 ENCOUNTER — Encounter: Payer: Self-pay | Admitting: Neurology

## 2016-02-01 VITALS — BP 100/78 | HR 68 | Resp 20 | Ht 68.0 in | Wt 198.0 lb

## 2016-02-01 DIAGNOSIS — G4730 Sleep apnea, unspecified: Secondary | ICD-10-CM | POA: Diagnosis not present

## 2016-02-01 DIAGNOSIS — F1110 Opioid abuse, uncomplicated: Secondary | ICD-10-CM | POA: Diagnosis not present

## 2016-02-01 DIAGNOSIS — G4726 Circadian rhythm sleep disorder, shift work type: Secondary | ICD-10-CM | POA: Diagnosis not present

## 2016-02-01 DIAGNOSIS — F17290 Nicotine dependence, other tobacco product, uncomplicated: Secondary | ICD-10-CM

## 2016-02-01 DIAGNOSIS — J441 Chronic obstructive pulmonary disease with (acute) exacerbation: Secondary | ICD-10-CM

## 2016-02-01 DIAGNOSIS — R0683 Snoring: Secondary | ICD-10-CM | POA: Insufficient documentation

## 2016-02-01 DIAGNOSIS — F17200 Nicotine dependence, unspecified, uncomplicated: Secondary | ICD-10-CM | POA: Diagnosis not present
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
## 2016-02-01 NOTE — Patient Instructions (Signed)
Sleep Apnea  Sleep apnea is a sleep disorder characterized by abnormal pauses in breathing while you sleep. When your breathing pauses, the level of oxygen in your blood decreases. This causes you to move out of deep sleep and into light sleep. As a result, your quality of sleep is poor, and the system that carries your blood throughout your body (cardiovascular system) experiences stress. If sleep apnea remains untreated, the following conditions can develop:  High blood pressure (hypertension).  Coronary artery disease.  Inability to achieve or maintain an erection (impotence).  Impairment of your thought process (cognitive dysfunction). There are three types of sleep apnea: 1. Obstructive sleep apnea--Pauses in breathing during sleep because of a blocked airway. 2. Central sleep apnea--Pauses in breathing during sleep because the area of the brain that controls your breathing does not send the correct signals to the muscles that control breathing. 3. Mixed sleep apnea--A combination of both obstructive and central sleep apnea. RISK FACTORS The following risk factors can increase your risk of developing sleep apnea:  Being overweight.  Smoking.  Having narrow passages in your nose and throat.  Being of older age.  Being female.  Alcohol use.  Sedative and tranquilizer use.  Ethnicity. Among individuals younger than 35 years, African Americans are at increased risk of sleep apnea. SYMPTOMS   Difficulty staying asleep.  Daytime sleepiness and fatigue.  Loss of energy.  Irritability.  Loud, heavy snoring.  Morning headaches.  Trouble concentrating.  Forgetfulness.  Decreased interest in sex.  Unexplained sleepiness. DIAGNOSIS  In order to diagnose sleep apnea, your caregiver will perform a physical examination. A sleep study done in the comfort of your own home may be appropriate if you are otherwise healthy. Your caregiver may also recommend that you spend the  night in a sleep lab. In the sleep lab, several monitors record information about your heart, lungs, and brain while you sleep. Your leg and arm movements and blood oxygen level are also recorded. TREATMENT The following actions may help to resolve mild sleep apnea:  Sleeping on your side.   Using a decongestant if you have nasal congestion.   Avoiding the use of depressants, including alcohol, sedatives, and narcotics.   Losing weight and modifying your diet if you are overweight. There also are devices and treatments to help open your airway:  Oral appliances. These are custom-made mouthpieces that shift your lower jaw forward and slightly open your bite. This opens your airway.  Devices that create positive airway pressure. This positive pressure "splints" your airway open to help you breathe better during sleep. The following devices create positive airway pressure:  Continuous positive airway pressure (CPAP) device. The CPAP device creates a continuous level of air pressure with an air pump. The air is delivered to your airway through a mask while you sleep. This continuous pressure keeps your airway open.  Nasal expiratory positive airway pressure (EPAP) device. The EPAP device creates positive air pressure as you exhale. The device consists of single-use valves, which are inserted into each nostril and held in place by adhesive. The valves create very little resistance when you inhale but create much more resistance when you exhale. That increased resistance creates the positive airway pressure. This positive pressure while you exhale keeps your airway open, making it easier to breath when you inhale again.  Bilevel positive airway pressure (BPAP) device. The BPAP device is used mainly in patients with central sleep apnea. This device is similar to the CPAP device because   it also uses an air pump to deliver continuous air pressure through a mask. However, with the BPAP machine, the  pressure is set at two different levels. The pressure when you exhale is lower than the pressure when you inhale.  Surgery. Typically, surgery is only done if you cannot comply with less invasive treatments or if the less invasive treatments do not improve your condition. Surgery involves removing excess tissue in your airway to create a wider passage way.   This information is not intended to replace advice given to you by your health care provider. Make sure you discuss any questions you have with your health care provider.   Document Released: 04/21/2002 Document Revised: 05/22/2014 Document Reviewed: 09/07/2011 Elsevier Interactive Patient Education 2016 [employer]vier Inc.   

## 2016-02-27 ENCOUNTER — Ambulatory Visit (INDEPENDENT_AMBULATORY_CARE_PROVIDER_SITE_OTHER): Payer: Medicaid Other | Admitting: Neurology

## 2016-02-27 DIAGNOSIS — F1110 Opioid abuse, uncomplicated: Secondary | ICD-10-CM

## 2016-02-27 DIAGNOSIS — J449 Chronic obstructive pulmonary disease, unspecified: Principal | ICD-10-CM

## 2016-02-27 DIAGNOSIS — J441 Chronic obstructive pulmonary disease with (acute) exacerbation: Secondary | ICD-10-CM

## 2016-02-27 DIAGNOSIS — G4733 Obstructive sleep apnea (adult) (pediatric): Secondary | ICD-10-CM

## 2016-02-27 DIAGNOSIS — G4726 Circadian rhythm sleep disorder, shift work type: Secondary | ICD-10-CM

## 2016-02-27 DIAGNOSIS — G4730 Sleep apnea, unspecified: Secondary | ICD-10-CM

## 2016-02-27 DIAGNOSIS — R0683 Snoring: Secondary | ICD-10-CM

## 2016-02-27 DIAGNOSIS — F17290 Nicotine dependence, other tobacco product, uncomplicated: Secondary | ICD-10-CM

## 2016-03-08 ENCOUNTER — Telehealth: Payer: Self-pay | Admitting: *Deleted

## 2016-03-09 NOTE — Telephone Encounter (Signed)
I called pt to discuss her sleep study results. No answer, left a message asking her to call me back.  Copy of sleep study faxed to referring physician, Dr. Murray HodgkinsBartko.

## 2016-03-14 NOTE — Telephone Encounter (Signed)
I called pt again. No answer, left a message asking her to call me back. °

## 2016-03-15 NOTE — Telephone Encounter (Signed)
I called pt again to discuss her sleep study results. No answer, left a message asking her to call me back.  This is my third attempt at calling this pt to discuss her sleep study results.  I will mail her a letter asking her to call me back. Sleep study sent to be scanned into EP[employer].

## 2016-03-15 NOTE — Telephone Encounter (Signed)
Pt returned my call. I advised her that her sleep study results revealed mild, but complex apnea. No hypoxemia or hypercapnia was noted and that cpap is not indicated. Dr. Vickey Hugerohmeier advises pt to reduce narcotic dose or discontinue it if possible. I advised pt to avoid caffeine containing beverages and chocolate. I offered pt a follow up with Dr. Vickey Hugerohmeier to discuss these results further, but pt declined at this time. Pt verbalized understanding of results. Pt had no questions at this time but was encouraged to call back if questions arise.
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
## 2017-07-07 ENCOUNTER — Encounter (HOSPITAL_COMMUNITY): Payer: Self-pay

## 2017-07-07 ENCOUNTER — Emergency Department (HOSPITAL_COMMUNITY)
Admission: EM | Admit: 2017-07-07 | Discharge: 2017-07-08 | Disposition: A | Discharge status: Home / Self Care | Payer: Medicaid Other | Attending: Emergency Medicine | Admitting: Emergency Medicine

## 2017-07-07 ENCOUNTER — Other Ambulatory Visit: Payer: Self-pay

## 2017-07-07 DIAGNOSIS — N939 Abnormal uterine and vaginal bleeding, unspecified: Secondary | ICD-10-CM | POA: Insufficient documentation

## 2017-07-07 DIAGNOSIS — Z5321 Procedure and treatment not carried out due to patient leaving prior to being seen by health care provider: Secondary | ICD-10-CM | POA: Diagnosis not present

## 2017-07-07 LAB — BASIC METABOLIC PANEL
ANION GAP: 10 (ref 5–15)
BUN: 9 mg/dL (ref 6–20)
CALCIUM: 8.8 mg/dL — AB (ref 8.9–10.3)
CHLORIDE: 107 mmol/L (ref 101–111)
CO2: 24 mmol/L (ref 22–32)
Creatinine, Ser: 0.88 mg/dL (ref 0.44–1.00)
GFR calc non Af Amer: >60 mL/min (ref >60)
Glucose, Bld: 83 mg/dL (ref 65–99)
Potassium: 3.8 mmol/L (ref 3.5–5.1)
Sodium: 141 mmol/L (ref 135–145)

## 2017-07-07 LAB — I-STAT BETA HCG BLOOD, ED (MC, WL, AP ONLY): I-stat hCG, quantitative: <5 m[IU]/mL (ref <5)

## 2017-07-07 LAB — CBC
HEMATOCRIT: 40 % (ref 36.0–46.0)
HEMOGLOBIN: 13.3 g/dL (ref 12.0–15.0)
MCH: 29.5 pg (ref 26.0–34.0)
MCHC: 33.3 g/dL (ref 30.0–36.0)
MCV: 88.7 fL (ref 78.0–100.0)
Platelets: 303 10*3/uL (ref 150–400)
RBC: 4.51 MIL/uL (ref 3.87–5.11)
RDW: 14.2 % (ref 11.5–15.5)
WBC: 9.1 10*3/uL (ref 4.0–10.5)

## 2017-07-07 NOTE — ED Notes (Signed)
Pt c/o spotting and white/yellow discharge with intermittent pain on the LLQ/suprapubic area.

## 2017-07-07 NOTE — ED Triage Notes (Signed)
Pt c/o vaginal bleeding x 1 month, "sometimes heavy and sometimes not and sometimes w/clots, sometimes w/pus" Also c/o intermittent pain to LLQ.

## 2017-07-07 NOTE — ED Notes (Signed)
Bed: [facility] Expected date:  Expected time:  Means of arrival:  Comments: 

## 2017-09-22 DIAGNOSIS — J449 Chronic obstructive pulmonary disease, unspecified: Secondary | ICD-10-CM | POA: Diagnosis not present

## 2017-09-22 DIAGNOSIS — Z79899 Other long term (current) drug therapy: Secondary | ICD-10-CM | POA: Insufficient documentation

## 2017-09-22 DIAGNOSIS — F17210 Nicotine dependence, cigarettes, uncomplicated: Secondary | ICD-10-CM | POA: Insufficient documentation

## 2017-09-22 DIAGNOSIS — J069 Acute upper respiratory infection, unspecified: Secondary | ICD-10-CM | POA: Insufficient documentation

## 2017-09-22 DIAGNOSIS — J3489 Other specified disorders of nose and nasal sinuses: Secondary | ICD-10-CM | POA: Diagnosis present

## 2017-09-23 ENCOUNTER — Emergency Department (HOSPITAL_COMMUNITY)
Admission: EM | Admit: 2017-09-23 | Discharge: 2017-09-23 | Disposition: A | Discharge status: Home / Self Care | Payer: Medicaid Other | Attending: Emergency Medicine | Admitting: Emergency Medicine

## 2017-09-23 ENCOUNTER — Encounter (HOSPITAL_COMMUNITY): Payer: Self-pay | Admitting: Emergency Medicine

## 2017-09-23 DIAGNOSIS — J069 Acute upper respiratory infection, unspecified: Secondary | ICD-10-CM

## 2017-09-23 MED ORDER — TRIAMCINOLONE ACETONIDE 55 MCG/ACT NA AERO: 2.0000 | INHALATION_SPRAY | Freq: Every day | NASAL | 12 refills | Status: DC

## 2017-09-23 NOTE — ED Notes (Signed)
ED Provider at bedside. 

## 2017-09-23 NOTE — Discharge Instructions (Addendum)
Continue Allegra, Use Nasacort and follow up with your doctor for recheck if symptoms persist.

## 2017-09-23 NOTE — ED Provider Notes (Signed)
[facility] EMERGENCY DEPARTMENT Provider Note   CSN: [id number] Arrival date & time: 09/22/17  2343     History   Chief Complaint Chief Complaint  Patient presents with  . URI    HPI Beverly Wong is a 37 y.o. female.  Patient here for evaluation of URI symptoms including sinus pressure, congestion, and ears popping x 3 days. No fever or cough. No nausea or vomiting. She has been taking Allegra-D for symptoms but was concerned because they are persistent.    URI   Associated symptoms include congestion and ear pain. Pertinent negatives include no vomiting, no sore throat, no cough and no rash.    Past Medical History:  Diagnosis Date  . Bipolar disorder (HCC)   . Headache   . Schizophrenia (HCC)   . Shortness of breath dyspnea    feels short of breath at times even when seated  . UTI (lower urinary tract infection)     Patient Active Problem List   Diagnosis Date Noted  . Sleep apnea 02/01/2016  . COPD exacerbation (HCC) 02/01/2016  . Opioid use disorder, mild, on maintenance therapy, abuse (HCC) 02/01/2016  . Snorings 02/01/2016  . Cervical radiculopathy 06/21/2015    Past Surgical History:  Procedure Laterality Date  . ANTERIOR CERVICAL DECOMP/DISCECTOMY FUSION N/A 06/21/2015   Procedure: Cervical six- seven Anterior Cervical Decompression/ Diskectomy/ Fusion;  Surgeon: Loura Halt Ditty, MD;  Location: [facility];  Service: Neurosurgery;  Laterality: N/A;  C6-7 Anterior cervical decompression/diskectomy/fusion  . TUBAL LIGATION       OB History   None      Home Medications    Prior to Admission medications   Medication Sig Start Date End Date Taking? Authorizing Provider  ARIPiprazole (ABILIFY IM) Inject 1 application into the muscle every 30 (thirty) days.    [provider]  diclofenac sodium (VOLTAREN) 1 % GEL Apply 2 g topically 4 (four) times daily.    [provider]  Oxycodone HCl 10 MG TABS Take 10 mg  by mouth every 6 (six) hours as needed.    [provider]    Family History Family History  Problem Relation Age of Onset  . Diabetes Mother   . Kidney cancer Father     Social History Social History   Tobacco Use  . Smoking status: Current Every Day Smoker    Packs/day: 1.00    Years: 17.00    Pack years: 17.00    Types: Cigarettes  . Smokeless tobacco: Never Used  Substance Use Topics  . Alcohol use: Yes    Comment: on ocaasion.   . Drug use: Yes    Types: Cocaine, Marijuana    Comment: uses cocaine occasionally, smokes marajuana QD.     Allergies   Ibuprofen   Review of Systems Review of Systems  Constitutional: Negative for chills and fever.  HENT: Positive for congestion, ear pain and sinus pressure. Negative for sore throat.   Respiratory: Negative.  Negative for cough.   Cardiovascular: Negative.   Gastrointestinal: Negative.  Negative for vomiting.  Musculoskeletal: Negative.   Skin: Negative.  Negative for rash.  Neurological: Negative.      Physical Exam Updated Vital Signs BP 120/70 (BP Location: Right Arm)   Pulse 73   Temp 97.8 F (36.6 C) (Oral)   Resp 16   SpO2 99%   Physical Exam  Constitutional: She appears well-developed and well-nourished. No distress.  HENT:  Head: Normocephalic.  Right Ear: Tympanic  membrane normal.  Left Ear: Tympanic membrane normal.  Neck: Normal range of motion. Neck supple.  Cardiovascular: Normal rate and regular rhythm.  No murmur heard. Pulmonary/Chest: Effort normal and breath sounds normal. She has no wheezes. She has no rales.  Abdominal: Soft. Bowel sounds are normal. There is no tenderness. There is no rebound and no guarding.  Musculoskeletal: Normal range of motion.  Lymphadenopathy:    She has no cervical adenopathy.  Neurological: She is alert. No cranial nerve deficit.  Skin: Skin is warm and dry. No rash noted.  Psychiatric: She has a normal mood and affect.     ED Treatments  / Results  Labs (all labs ordered are listed, but only abnormal results are displayed) Labs Reviewed - No data to display  EKG None  Radiology No results found.  Procedures Procedures (including critical care time)  Medications Ordered in ED Medications - No data to display   Initial Impression / Assessment and Plan / ED Course  I have reviewed the triage vital signs and the nursing notes.  Pertinent labs & imaging results that were available during my care of the patient were reviewed by me and considered in my medical decision making (see chart for details).     Patient here with symptoms upper respiratory congestion without fever x 3 days. Well appearing. Exam unremarkable. Will suggest symptomatic treatment and prn f/u with PCP.  Final Clinical Impressions(s) / ED Diagnoses   Final diagnoses:  None   1. URI  ED Discharge Orders    None       Elpidio Anis, PA-C 09/23/17 1610    Devoria Albe, MD 09/23/17 [phone]

## 2017-09-23 NOTE — ED Triage Notes (Signed)
Reports head congestion and ears popping for three days. Taking allegra D and Nyquil.  Hasn't taken any today.

## 2017-10-03 ENCOUNTER — Other Ambulatory Visit (HOSPITAL_COMMUNITY)
Admission: RE | Admit: 2017-10-03 | Discharge: 2017-10-03 | Disposition: A | Discharge status: Home / Self Care | Payer: Medicaid Other | Source: Ambulatory Visit | Attending: Obstetrics | Admitting: Obstetrics

## 2017-10-03 ENCOUNTER — Encounter: Payer: Self-pay | Admitting: Obstetrics

## 2017-10-03 ENCOUNTER — Ambulatory Visit: Payer: Medicaid Other | Admitting: Obstetrics

## 2017-10-03 VITALS — BP 120/77 | HR 69 | Ht 68.0 in | Wt 228.0 lb

## 2017-10-03 DIAGNOSIS — F17210 Nicotine dependence, cigarettes, uncomplicated: Secondary | ICD-10-CM

## 2017-10-03 DIAGNOSIS — N898 Other specified noninflammatory disorders of vagina: Secondary | ICD-10-CM

## 2017-10-03 DIAGNOSIS — Z6834 Body mass index (BMI) 34.0-34.9, adult: Secondary | ICD-10-CM

## 2017-10-03 DIAGNOSIS — Z01419 Encounter for gynecological examination (general) (routine) without abnormal findings: Secondary | ICD-10-CM | POA: Diagnosis not present

## 2017-10-03 DIAGNOSIS — Z0000 Encounter for general adult medical examination without abnormal findings: Secondary | ICD-10-CM | POA: Diagnosis not present

## 2017-10-03 DIAGNOSIS — B9689 Other specified bacterial agents as the cause of diseases classified elsewhere: Secondary | ICD-10-CM

## 2017-10-03 DIAGNOSIS — N939 Abnormal uterine and vaginal bleeding, unspecified: Secondary | ICD-10-CM

## 2017-10-03 DIAGNOSIS — Z113 Encounter for screening for infections with a predominantly sexual mode of transmission: Secondary | ICD-10-CM

## 2017-10-03 DIAGNOSIS — E6609 Other obesity due to excess calories: Secondary | ICD-10-CM

## 2017-10-03 DIAGNOSIS — N760 Acute vaginitis: Secondary | ICD-10-CM

## 2017-10-03 MED ORDER — NORETHINDRONE ACETATE 5 MG PO TABS: 10.0000 mg | ORAL_TABLET | Freq: Every day | ORAL | 0 refills | Status: DC

## 2017-10-03 MED ORDER — METRONIDAZOLE 0.75 % VA GEL: 1.0000 | Freq: Two times a day (BID) | VAGINAL | 12 refills | Status: DC

## 2017-10-03 NOTE — Progress Notes (Signed)
Subjective:        Beverly Wong is a 37 y.o. female here for a routine exam.  Current complaints: Vaginal bleeding since February.  Has a history of irregular periods.   Denies dysmenorrhea or dysuria.  Personal health questionnaire:  Is patient [name], have a family history of breast and/or ovarian cancer: no Is there a family history of uterine cancer diagnosed at age < 65, gastrointestinal cancer, urinary tract cancer, family member who is a Personnel officer syndrome-associated carrier: no Is the patient overweight and hypertensive, family history of diabetes, personal history of gestational diabetes, preeclampsia or PCOS: no Is patient over 4, have PCOS,  family history of premature CHD under age 71, diabetes, smoke, have hypertension or peripheral artery disease:  no At any time, has a partner hit, kicked or otherwise hurt or frightened you?: no Over the past 2 weeks, have you felt down, depressed or hopeless?: no Over the past 2 weeks, have you felt little interest or pleasure in doing things?:no   Gynecologic History Patient's last menstrual period was 06/15/2017 (lmp unknown). Contraception: tubal ligation Last Pap: unknown. Results were: unknown Last mammogram: n/a. Results were: n/a  Obstetric History OB History  Gravida Para Term Preterm AB Living  SAB TAB Ectopic Multiple Live Births  # Outcome Date GA Lbr Len/2nd Weight Sex Delivery Anes PTL Lv  3 Term      Vag-Spont   LIV  2 TAB           1 SAB             Past Medical History:  Diagnosis Date  . Bipolar disorder (HCC)   . Headache   . Schizophrenia (HCC)   . Shortness of breath dyspnea    feels short of breath at times even when seated  . UTI (lower urinary tract infection)     Past Surgical History:  Procedure Laterality Date  . ANTERIOR CERVICAL DECOMP/DISCECTOMY FUSION N/A 06/21/2015   Procedure: Cervical six- seven Anterior Cervical Decompression/ Diskectomy/ Fusion;   Surgeon: Loura Halt Ditty, MD;  Location: [facility];  Service: Neurosurgery;  Laterality: N/A;  C6-7 Anterior cervical decompression/diskectomy/fusion  . TUBAL LIGATION  2009     Current Outpatient Medications:  .  ARIPiprazole (ABILIFY IM), Inject 1 application into the muscle every 30 (thirty) days., Disp: , Rfl:  .  triamcinolone (NASACORT) 55 MCG/ACT AERO nasal inhaler, Place 2 sprays into the nose daily., Disp: 1 Inhaler, Rfl: 12 .  diclofenac sodium (VOLTAREN) 1 % GEL, Apply 2 g topically 4 (four) times daily., Disp: , Rfl:  .  metroNIDAZOLE (METROGEL VAGINAL) 0.75 % vaginal gel, Place 1 Applicatorful vaginally 2 (two) times daily. Use twice a month for 6 months - for recurrent BV., Disp: 70 g, Rfl: 12 .  norethindrone (AYGESTIN) 5 MG tablet, Take 2 tablets (10 mg total) by mouth daily., Disp: 60 tablet, Rfl: 0 .  Oxycodone HCl 10 MG TABS, Take 10 mg by mouth every 6 (six) hours as needed., Disp: , Rfl:  Allergies  Allergen Reactions  . Ibuprofen Other (See Comments)    Can't take it with the Abilify she is on    Social History   Tobacco Use  . Smoking status: Current Every Day Smoker    Packs/day: 1.00    Years: 17.00    Pack years: 17.00    Types: Cigarettes  .  Smokeless tobacco: Never Used  Substance Use Topics  . Alcohol use: Yes    Comment: on ocaasion.     Family History  Problem Relation Age of Onset  . Diabetes Mother   . Kidney cancer Father   . Breast cancer Maternal Aunt       Review of Systems  Constitutional: negative for fatigue and weight loss Respiratory: negative for cough and wheezing Cardiovascular: negative for chest pain, fatigue and palpitations Gastrointestinal: negative for abdominal pain and change in bowel habits Musculoskeletal:negative for myalgias Neurological: negative for gait problems and tremors Behavioral/Psych: negative for abusive relationship, depression Endocrine: negative for temperature intolerance     Genitourinary:POSITIVE for abnormal menstrual periods and malodorous vaginal discharge Integument/breast: negative for breast lump, breast tenderness, nipple discharge and skin lesion(s)    Objective:       BP 120/77   Pulse 69   Ht  (1.727 m)   Wt 228 lb (103.4 kg)   LMP 06/15/2017 (LMP Unknown)   BMI 34.67 kg/m  General:   alert  Skin:   no rash or abnormalities  Lungs:   clear to auscultation bilaterally  Heart:   regular rate and rhythm, S1, S2 normal, no murmur, click, rub or gallop  Breasts:   normal without suspicious masses, skin or nipple changes or axillary nodes  Abdomen:  normal findings: no organomegaly, soft, non-tender and no hernia  Pelvis:  External genitalia: normal general appearance Urinary system: urethral meatus normal and bladder without fullness, nontender Vaginal: normal without tenderness, induration or masses Cervix: normal appearance Adnexa: normal bimanual exam Uterus: anteverted and non-tender, normal size   Lab Review Urine pregnancy test Labs reviewed yes Radiologic studies reviewed yes  50% of 20 min visit spent on counseling and coordination of care.   Assessment:     1. Encounter for routine gynecological examination with Papanicolaou smear of cervix Rx: - Cytology - PAP  2. Abnormal uterine bleeding (AUB) Rx: - US PELVIC COMPLETE WITH TRANSVAGINAL; Future - norethindrone (AYGESTIN) 5 MG tablet; Take 2 tablets (10 mg total) by mouth daily.  Dispense: 60 tablet; Refill: 0  3. Vaginal discharge Rx: - Cervicovaginal ancillary only  4. BV (bacterial vaginosis) Rx: - metroNIDAZOLE (METROGEL VAGINAL) 0.75 % vaginal gel; Place 1 Applicatorful vaginally 2 (two) times daily. Use twice a month for 6 months - for recurrent BV.  Dispense: 70 g; Refill: 12  5. Screening for STD (sexually transmitted disease) Rx: - HIV antibody - Hepatitis B surface antigen - RPR - Hepatitis C antibody  6. Class 1 obesity due to excess calories  without serious comorbidity with body mass index (BMI) of 34.0 to 34.9 in adult - program of caloric reduction, exercise and behavioral modification recommended  7. Tobacco dependence due to cigarettes - smoking cessation program recommended, including medication and behavioral modification     Plan:    Education reviewed: calcium supplements, depression evaluation, low fat, low cholesterol diet, safe sex/STD prevention, self breast exams, skin cancer screening and weight bearing exercise. Follow up in: 2 weeks.   Meds ordered this encounter  Medications  . norethindrone (AYGESTIN) 5 MG tablet    Sig: Take 2 tablets (10 mg total) by mouth daily.    Dispense:  60 tablet    Refill:  0  . metroNIDAZOLE (METROGEL VAGINAL) 0.75 % vaginal gel    Sig: Place 1 Applicatorful vaginally 2 (two) times daily. Use twice a month for 6 months - for recurrent BV.    Dispense:  70 g    Refill:  12   Orders Placed This Encounter  Procedures  . US PELVIC COMPLETE WITH TRANSVAGINAL    Standing Status:   Future    Standing Expiration Date:   12/04/2018    Order Specific Question:   Reason for Exam (SYMPTOM  OR DIAGNOSIS REQUIRED)    Answer:   AUB    Order Specific Question:   Preferred imaging location?    Answer:   [facility]  . HIV antibody  . Hepatitis B surface antigen  . RPR  . Hepatitis C antibody     Brock Bad MD 10-03-2017

## 2017-10-03 NOTE — Progress Notes (Signed)
Patient is in the office for new GYN. Pt states that she has been bleeding since 06-15-17, without any breaks. Pt reports that bleeding gets heavier around times when her normal cycle should come, and lightens up after that, but bleeding has been continuous. Pt states last pap over a year ago.

## 2017-10-04 LAB — CERVICOVAGINAL ANCILLARY ONLY
BACTERIAL VAGINITIS: POSITIVE — AB
CHLAMYDIA, DNA PROBE: NEGATIVE
Candida vaginitis: NEGATIVE
NEISSERIA GONORRHEA: NEGATIVE
Trichomonas: NEGATIVE

## 2017-10-04 LAB — HEPATITIS B SURFACE ANTIGEN: HEP B S AG: NEGATIVE

## 2017-10-04 LAB — RPR: RPR Ser Ql: NONREACTIVE

## 2017-10-04 LAB — HIV ANTIBODY (ROUTINE TESTING W REFLEX): HIV Screen 4th Generation wRfx: NONREACTIVE

## 2017-10-04 LAB — HEPATITIS C ANTIBODY: Hep C Virus Ab: <0.1 s/co ratio (ref 0.0–0.9)

## 2017-10-05 ENCOUNTER — Other Ambulatory Visit: Payer: Self-pay | Admitting: Obstetrics

## 2017-10-05 LAB — CYTOLOGY - PAP
Diagnosis: NEGATIVE
HPV: NOT DETECTED

## 2017-10-11 ENCOUNTER — Ambulatory Visit (HOSPITAL_COMMUNITY)
Admission: RE | Admit: 2017-10-11 | Discharge: 2017-10-11 | Disposition: A | Discharge status: Home / Self Care | Payer: Medicaid Other | Source: Ambulatory Visit | Attending: Obstetrics | Admitting: Obstetrics

## 2017-10-11 DIAGNOSIS — N838 Other noninflammatory disorders of ovary, fallopian tube and broad ligament: Secondary | ICD-10-CM | POA: Diagnosis not present

## 2017-10-11 DIAGNOSIS — N939 Abnormal uterine and vaginal bleeding, unspecified: Secondary | ICD-10-CM | POA: Insufficient documentation

## 2017-10-17 ENCOUNTER — Other Ambulatory Visit (HOSPITAL_COMMUNITY)
Admission: RE | Admit: 2017-10-17 | Discharge: 2017-10-17 | Disposition: A | Discharge status: Home / Self Care | Payer: Medicaid Other | Source: Ambulatory Visit | Attending: Obstetrics | Admitting: Obstetrics

## 2017-10-17 ENCOUNTER — Encounter: Payer: Self-pay | Admitting: Obstetrics

## 2017-10-17 ENCOUNTER — Ambulatory Visit (INDEPENDENT_AMBULATORY_CARE_PROVIDER_SITE_OTHER): Payer: Medicaid Other | Admitting: Obstetrics

## 2017-10-17 DIAGNOSIS — N939 Abnormal uterine and vaginal bleeding, unspecified: Secondary | ICD-10-CM

## 2017-10-17 DIAGNOSIS — F17210 Nicotine dependence, cigarettes, uncomplicated: Secondary | ICD-10-CM

## 2017-10-17 DIAGNOSIS — Z3202 Encounter for pregnancy test, result negative: Secondary | ICD-10-CM | POA: Diagnosis not present

## 2017-10-17 DIAGNOSIS — Z6834 Body mass index (BMI) 34.0-34.9, adult: Secondary | ICD-10-CM

## 2017-10-17 DIAGNOSIS — E6609 Other obesity due to excess calories: Secondary | ICD-10-CM

## 2017-10-17 DIAGNOSIS — B3731 Acute candidiasis of vulva and vagina: Secondary | ICD-10-CM

## 2017-10-17 DIAGNOSIS — B373 Candidiasis of vulva and vagina: Secondary | ICD-10-CM

## 2017-10-17 DIAGNOSIS — N840 Polyp of corpus uteri: Secondary | ICD-10-CM | POA: Insufficient documentation

## 2017-10-17 LAB — POCT URINE PREGNANCY: Preg Test, Ur: NEGATIVE

## 2017-10-17 MED ORDER — FLUCONAZOLE 150 MG PO TABS: 150.0000 mg | ORAL_TABLET | Freq: Once | ORAL | 0 refills | Status: AC

## 2017-10-17 NOTE — Progress Notes (Signed)
RGYN patient presents for endometrial Bx due to abnormal vaginal bleeding. Has Hx of Irregular periods.   LMP: unsure   UPT: NEGATIVE

## 2017-10-17 NOTE — Progress Notes (Signed)
Endometrial Biopsy Procedure Note  Pre-operative Diagnosis: AUB  Post-operative Diagnosis: same  Indications: abnormal uterine bleeding  Procedure Details   Urine pregnancy test was done in office and result was negative.  The risks (including infection, bleeding, pain, and uterine perforation) and benefits of the procedure were explained to the patient and Written informed consent was obtained.    The patient was placed in the dorsal lithotomy position.  Bimanual exam showed the uterus to be in the neutral position.  A Graves' speculum inserted in the vagina, and the cervix prepped with povidone iodine.  Endocervical curettage with a Kevorkian curette was not performed.   A sharp tenaculum was applied to the anterior lip of the cervix for stabilization.  A sterile uterine sound was used to sound the uterus to a depth of 7cm.  A Pipelle endometrial aspirator was used to sample the endometrium.  Sample was sent for pathologic examination.  Condition: Stable  Complications: None  Plan:  The patient was advised to call for any fever or for prolonged or severe pain or bleeding. She was advised to use OTC acetaminophen as needed for mild to moderate pain. She was advised to avoid vaginal intercourse for 48 hours or until the bleeding has completely stopped.  Attending Physician Documentation: I was present for or participated in the entire procedure, including opening and closing.    Brock BadHARLES A. Shizue Kaseman MD 10-17-2017

## 2017-10-23 ENCOUNTER — Encounter: Payer: Self-pay | Admitting: *Deleted

## 2017-11-05 ENCOUNTER — Ambulatory Visit: Payer: Medicaid Other | Admitting: Obstetrics

## 2017-11-09 ENCOUNTER — Ambulatory Visit (INDEPENDENT_AMBULATORY_CARE_PROVIDER_SITE_OTHER): Payer: Medicaid Other | Admitting: Obstetrics

## 2017-11-09 ENCOUNTER — Encounter: Payer: Self-pay | Admitting: Obstetrics

## 2017-11-09 VITALS — BP 108/68 | HR 70 | Ht 68.0 in | Wt 225.0 lb

## 2017-11-09 DIAGNOSIS — Z9289 Personal history of other medical treatment: Secondary | ICD-10-CM

## 2017-11-09 DIAGNOSIS — N939 Abnormal uterine and vaginal bleeding, unspecified: Secondary | ICD-10-CM

## 2017-11-09 DIAGNOSIS — B373 Candidiasis of vulva and vagina: Secondary | ICD-10-CM | POA: Diagnosis not present

## 2017-11-09 DIAGNOSIS — B3731 Acute candidiasis of vulva and vagina: Secondary | ICD-10-CM

## 2017-11-09 MED ORDER — FLUCONAZOLE 150 MG PO TABS: 150.0000 mg | ORAL_TABLET | Freq: Once | ORAL | 2 refills | Status: AC

## 2017-11-09 NOTE — Progress Notes (Signed)
Patient ID: Beverly Wong, female   DOB: February 17, 1981, 37 y.o.   MRN: [id number]  Chief Complaint  Patient presents with  . Follow-up    HPI Beverly Wong is a 37 y.o. female.  Has history of AUB.  Endometrial biopsy done.  Presents today for results. HPI  Past Medical History:  Diagnosis Date  . Bipolar disorder (HCC)   . Headache   . Schizophrenia (HCC)   . Shortness of breath dyspnea    feels short of breath at times even when seated  . UTI (lower urinary tract infection)     Past Surgical History:  Procedure Laterality Date  . ANTERIOR CERVICAL DECOMP/DISCECTOMY FUSION N/A 06/21/2015   Procedure: Cervical six- seven Anterior Cervical Decompression/ Diskectomy/ Fusion;  Surgeon: Loura Halt Ditty, MD;  Location: [facility];  Service: Neurosurgery;  Laterality: N/A;  C6-7 Anterior cervical decompression/diskectomy/fusion  . TUBAL LIGATION  2009    Family History  Problem Relation Age of Onset  . Diabetes Mother   . Kidney cancer Father   . Breast cancer Maternal Aunt     Social History Social History   Tobacco Use  . Smoking status: Current Every Day Smoker    Packs/day: 1.00    Years: 17.00    Pack years: 17.00    Types: Cigarettes  . Smokeless tobacco: Never Used  Substance Use Topics  . Alcohol use: Yes    Comment: on ocaasion.   . Drug use: Yes    Types: Cocaine, Marijuana    Comment: uses cocaine occasionally, smokes marajuana QD.    Allergies  Allergen Reactions  . Ibuprofen Other (See Comments)    Can't take it with the Abilify she is on    Current Outpatient Medications  Medication Sig Dispense Refill  . ARIPiprazole (ABILIFY IM) Inject 1 application into the muscle every 30 (thirty) days.    . metroNIDAZOLE (METROGEL VAGINAL) 0.75 % vaginal gel Place 1 Applicatorful vaginally 2 (two) times daily. Use twice a month for 6 months - for recurrent BV. 70 g 12  . diclofenac sodium (VOLTAREN) 1 % GEL Apply 2 g topically 4 (four) times daily.    .  fluconazole (DIFLUCAN) 150 MG tablet Take 1 tablet (150 mg total) by mouth once for 1 dose. 1 tablet 2  . norethindrone (AYGESTIN) 5 MG tablet Take 2 tablets (10 mg total) by mouth daily. 60 tablet 0  . Oxycodone HCl 10 MG TABS Take 10 mg by mouth every 6 (six) hours as needed.    . triamcinolone (NASACORT) 55 MCG/ACT AERO nasal inhaler Place 2 sprays into the nose daily. (Patient not taking: Reported on 11/09/2017) 1 Inhaler 12   No current facility-administered medications for this visit.     Review of Systems Review of Systems Constitutional: negative for fatigue and weight loss Respiratory: negative for cough and wheezing Cardiovascular: negative for chest pain, fatigue and palpitations Gastrointestinal: negative for abdominal pain and change in bowel habits Genitourinary:negative Integument/breast: negative for nipple discharge Musculoskeletal:negative for myalgias Neurological: negative for gait problems and tremors Behavioral/Psych: negative for abusive relationship, depression Endocrine: negative for temperature intolerance      Blood pressure 108/68, pulse 70, height 5\' 8"  (1.727 m), weight 225 lb (102.1 kg), last menstrual period 10/18/2017.  Physical Exam Physical Exam:  Deferred  >50% of 15 min visit spent on counseling and coordination of care.   Data Reviewed Ultrasound Pathology  Assessment     1. Abnormal uterine bleeding (AUB) - wants Endometrial Ablation -  literature dispensed  2. History of endometrial biopsy - normal secretory endometrium and benign polyp  3. Candida vaginitis Rx: - fluconazole (DIFLUCAN) 150 MG tablet; Take 1 tablet (150 mg total) by mouth once for 1 dose.  Dispense: 1 tablet; Refill: 2  4. Paraovarian cyst, small, simple, asymptomatic    Plan    Follow up in 2 weeks for consult with Dr. Earlene Plateravis for Endometrial Ablation    No orders of the defined types were placed in this encounter.  Meds ordered this encounter  Medications   . fluconazole (DIFLUCAN) 150 MG tablet    Sig: Take 1 tablet (150 mg total) by mouth once for 1 dose.    Dispense:  1 tablet    Refill:  2      Brock BadHARLES A. Adja Ruff MD 11-09-2017

## 2017-11-09 NOTE — Progress Notes (Signed)
Presents for follow up to Endo Bx.  She has stopped bleeding.  Wants RX for yeast.

## 2017-11-20 ENCOUNTER — Encounter: Payer: Self-pay | Admitting: Obstetrics and Gynecology

## 2017-11-20 ENCOUNTER — Ambulatory Visit (INDEPENDENT_AMBULATORY_CARE_PROVIDER_SITE_OTHER): Payer: Medicaid Other | Admitting: Obstetrics and Gynecology

## 2017-11-20 VITALS — BP 108/75 | HR 74 | Ht 68.0 in | Wt 223.5 lb

## 2017-11-20 DIAGNOSIS — N939 Abnormal uterine and vaginal bleeding, unspecified: Secondary | ICD-10-CM

## 2017-11-20 DIAGNOSIS — N946 Dysmenorrhea, unspecified: Secondary | ICD-10-CM | POA: Diagnosis not present

## 2017-11-20 NOTE — Progress Notes (Signed)
GYNECOLOGY OFFICE FOLLOW UP NOTE  History:  [age] y.o. [id number] here today for follow up for ablation consult.  Bled from February to June 2019. Usually periods are 10 days long or longer, periods are painful. Periods are heavy, she goes through 5-6 pads/tampons in one day. Has bad cramping with periods as well. Has tried Aygestin in the past for bleeding and her period stopped with the Aygestin but she has not continued to take it and doesn't want to take it long term.  Past Medical History:  Diagnosis Date  . Bipolar disorder (HCC)   . Headache   . Schizophrenia (HCC)   . Shortness of breath dyspnea    feels short of breath at times even when seated  . UTI (lower urinary tract infection)     Past Surgical History:  Procedure Laterality Date  . ANTERIOR CERVICAL DECOMP/DISCECTOMY FUSION N/A 06/21/2015   Procedure: Cervical six- seven Anterior Cervical Decompression/ Diskectomy/ Fusion;  Surgeon: Loura HaltBenjamin Jared Ditty, MD;  Location: [facility];  Service: Neurosurgery;  Laterality: N/A;  C6-7 Anterior cervical decompression/diskectomy/fusion  . TUBAL LIGATION  2009     Current Outpatient Medications:  .  ARIPiprazole (ABILIFY IM), Inject 1 application into the muscle every 30 (thirty) days., Disp: , Rfl:  .  metroNIDAZOLE (METROGEL VAGINAL) 0.75 % vaginal gel, Place 1 Applicatorful vaginally 2 (two) times daily. Use twice a month for 6 months - for recurrent BV., Disp: 70 g, Rfl: 12  The following portions of the patient's history were reviewed and updated as appropriate: allergies, current medications, past family history, past medical history, past social history, past surgical history and problem list.   Review of Systems:  Pertinent items noted in HPI and remainder of comprehensive ROS otherwise negative.   Objective:  Physical Exam BP 108/75   Pulse 74   Ht 5\' 8"  (1.727 m)   Wt 223 lb 8 oz (101.4 kg)   LMP 11/15/2017   BMI 33.98 kg/m  CONSTITUTIONAL: Well-developed,  well-nourished female in no acute distress.  HENT:  Normocephalic, atraumatic. External right and left ear normal. Oropharynx is clear and moist EYES: Conjunctivae and EOM are normal. Pupils are equal, round, and reactive to light. No scleral icterus.  NECK: Normal range of motion, supple, no masses SKIN: Skin is warm and dry. No rash noted. Not diaphoretic. No erythema. No pallor. NEUROLOGIC: Alert and oriented to person, place, and time. Normal reflexes, muscle tone coordination. No cranial nerve deficit noted. PSYCHIATRIC: Normal mood and affect. Normal behavior. Normal judgment and thought content. CARDIOVASCULAR: Normal heart rate noted RESPIRATORY: Effort and breath sounds normal, no problems with respiration noted ABDOMEN: Soft, no distention noted.   PELVIC: Deferred MUSCULOSKELETAL: Normal range of motion. No edema noted.  Labs and Imaging No results found.  CLINICAL DATA:  Abnormal uterine bleeding  EXAM: TRANSABDOMINAL AND TRANSVAGINAL ULTRASOUND OF PELVIS  TECHNIQUE: Both transabdominal and transvaginal ultrasound examinations of the pelvis were performed. Transabdominal technique was performed for global imaging of the pelvis including uterus, ovaries, adnexal regions, and pelvic cul-de-sac. It was necessary to proceed with endovaginal exam following the transabdominal exam to visualize the uterus, endometrium, ovaries and adnexa.  COMPARISON:  None  FINDINGS: Uterus  Measurements: 9.3 x 4.7 x 5.8 cm. No fibroids or other mass visualized.  Endometrium  Thickness: 7 mm in thickness.  No focal abnormality visualized.  Right ovary  Measurements: 4.3 x 2.3 x 2.7 cm. Normal appearance/no adnexal mass.  Left ovary  Measurements: 3.7 x 2.2  x 2.2 cm. Small adnexal cyst medial to the left ovary measures 2.1 cm and appears benign.  Other findings  No abnormal free fluid.  IMPRESSION: Small left paraovarian cyst, 2.1 cm.  Normal thickness of  the endometrium for premenopausal patient. If bleeding remains unresponsive to hormonal or medical therapy, sonohysterogram should be considered for focal lesion work-up. (Ref: Radiological Reasoning: Algorithmic Workup of Abnormal Vaginal Bleeding with Endovaginal Sonography and Sonohysterography. AJR 2008; 161:W96-04)   Electronically Signed   By: Charlett Nose M.D.   On: 10/11/2017 10:52  Diagnosis Endometrium, biopsy - BENIGN SECRETORY ENDOMETRIUM AND BENIGN ENDOMETRIAL POLYP. - NO HYPERPLASIA OR MALIGNANCY IDENTIFIED. Jimmy Picket MD Pathologist, Electronic Signature (Case signed 10/19/2017)  Assessment & Plan:   1. Abnormal uterine bleeding Patient has had significant AUB with a continuous period for 5 months this year. Improved with hormonal therapy but does not want to be on hormonal therapy long term. EMB with polyp and negative path and pap smear within normal limits. She has 9 cm uterus without abnormality noted.   I reviewed options for management including continued hormonal therapy including oral medication and IUD. I reviewed that she is an excellent IUD candidate, reviewed risks/benefits of IUD placement and that it is reversible, but she declines IUD. She had previously been counseled regarding endometrial ablation and she is interested in this. I reviewed risks/benefits of endometrial ablation including infection, hemorrhage, damage to surrounding tissue and organs, possibility of not ablating all tissue and that she may continue to have periods, possibility of stenotic cervix causing difficulty with future endometrial sampling, possibility of increased pain, particularly if not all tissue is ablated and cervix is stenotic. Reviewed she may need further management if ablation fails. I reviewed that she will not be able to achieve pregnancy after endometrial ablation is done. She verbalizes understanding of the above, will plan for endometrial ablation.    Routine  preventative health maintenance measures emphasized. Please refer to After Visit Summary for other counseling recommendations.   Return in about 3 months (around 02/20/2018).   Baldemar Lenis, M.D. [facility]

## 2017-11-26 ENCOUNTER — Encounter: Payer: Self-pay | Admitting: Obstetrics and Gynecology

## 2017-12-10 ENCOUNTER — Encounter (HOSPITAL_COMMUNITY): Payer: Self-pay

## 2017-12-24 ENCOUNTER — Encounter (HOSPITAL_COMMUNITY): Admission: RE | Payer: Self-pay | Source: Ambulatory Visit

## 2017-12-24 ENCOUNTER — Ambulatory Visit (HOSPITAL_COMMUNITY)
Admission: RE | Admit: 2017-12-24 | Payer: Medicaid Other | Source: Ambulatory Visit | Admitting: Obstetrics and Gynecology

## 2017-12-24 SURGERY — DILATATION & CURETTAGE/HYSTEROSCOPY WITH NOVASURE ABLATION
Anesthesia: Choice

## 2017-12-25 ENCOUNTER — Encounter (HOSPITAL_COMMUNITY): Payer: Self-pay

## 2018-01-04 ENCOUNTER — Encounter (HOSPITAL_COMMUNITY): Payer: Self-pay | Admitting: *Deleted

## 2018-01-04 ENCOUNTER — Emergency Department (HOSPITAL_COMMUNITY)
Admission: EM | Admit: 2018-01-04 | Discharge: 2018-01-05 | Disposition: A | Discharge status: Home / Self Care | Payer: Medicaid Other | Attending: Emergency Medicine | Admitting: Emergency Medicine

## 2018-01-04 ENCOUNTER — Other Ambulatory Visit: Payer: Self-pay

## 2018-01-04 DIAGNOSIS — N939 Abnormal uterine and vaginal bleeding, unspecified: Secondary | ICD-10-CM | POA: Insufficient documentation

## 2018-01-04 DIAGNOSIS — Z5321 Procedure and treatment not carried out due to patient leaving prior to being seen by health care provider: Secondary | ICD-10-CM | POA: Insufficient documentation

## 2018-01-04 LAB — CBC
HCT: 38.1 % (ref 36.0–46.0)
Hemoglobin: 12.3 g/dL (ref 12.0–15.0)
MCH: 28.9 pg (ref 26.0–34.0)
MCHC: 32.3 g/dL (ref 30.0–36.0)
MCV: 89.6 fL (ref 78.0–100.0)
Platelets: 287 10*3/uL (ref 150–400)
RBC: 4.25 MIL/uL (ref 3.87–5.11)
RDW: 13.6 % (ref 11.5–15.5)
WBC: 8.6 10*3/uL (ref 4.0–10.5)

## 2018-01-04 LAB — COMPREHENSIVE METABOLIC PANEL
ALT: 17 U/L (ref 0–44)
AST: 24 U/L (ref 15–41)
Albumin: 4.1 g/dL (ref 3.5–5.0)
Alkaline Phosphatase: 56 U/L (ref 38–126)
Anion gap: 7 (ref 5–15)
BUN: 6 mg/dL (ref 6–20)
CO2: 26 mmol/L (ref 22–32)
Calcium: 9.2 mg/dL (ref 8.9–10.3)
Chloride: 106 mmol/L (ref 98–111)
Creatinine, Ser: 1.03 mg/dL — ABNORMAL HIGH (ref 0.44–1.00)
GFR calc Af Amer: >60 mL/min (ref >60)
GFR calc non Af Amer: >60 mL/min (ref >60)
Glucose, Bld: 87 mg/dL (ref 70–99)
Potassium: 3.6 mmol/L (ref 3.5–5.1)
Sodium: 139 mmol/L (ref 135–145)
Total Bilirubin: 0.4 mg/dL (ref 0.3–1.2)
Total Protein: 7 g/dL (ref 6.5–8.1)

## 2018-01-04 LAB — I-STAT BETA HCG BLOOD, ED (MC, WL, AP ONLY): I-stat hCG, quantitative: <5 m[IU]/mL (ref <5)

## 2018-01-04 NOTE — ED Triage Notes (Signed)
thwe pt is c/o heavy vaginal bleeding today  She has a history of the same for several months  This is her regular period  lmp now

## 2018-01-05 NOTE — ED Notes (Signed)
Called for room, no answer

## 2018-01-07 NOTE — ED Notes (Signed)
Follow up call made  No answer  01/07/18  1014 s Ricketta Colantonio rn

## 2018-02-15 NOTE — Patient Instructions (Addendum)
Your procedure is scheduled on: Monday, 10/21  Enter through the Main Entrance of [facility] at: 11:15 am  Pick up the phone at the desk and dial [phone].  Call this number if you have problems the morning of surgery: [phone].  Remember: Do NOT eat food or Do NOT drink clear liquids (including water) after midnight Sunday.  Take these medicines the morning of surgery with a SIP OF WATER: abilify  Brush your teeth on the day of surgery.  Bring inhaler with you on day of surgery.  Do Not smoke on the day of surgery.  Stop herbal medications, vitamin supplements, Ibuprofen/NSAIDS at this time.  Do NOT wear jewelry (body piercing), metal hair clips/bobby pins, make-up, or nail polish. Do NOT wear lotions, powders, or perfumes.  You may wear deoderant. Do NOT shave for 48 hours prior to surgery. Do NOT bring valuables to the hospital. Contacts, dentures, or bridgework may not be worn into surgery.  Have a responsible adult drive you home and stay with you for 24 hours after your procedure.

## 2018-02-25 ENCOUNTER — Encounter (HOSPITAL_COMMUNITY)
Admission: RE | Admit: 2018-02-25 | Discharge: 2018-02-25 | Disposition: A | Discharge status: Home / Self Care | Payer: Medicaid Other | Source: Ambulatory Visit | Attending: Obstetrics and Gynecology | Admitting: Obstetrics and Gynecology

## 2018-03-01 ENCOUNTER — Inpatient Hospital Stay (HOSPITAL_COMMUNITY): Admission: RE | Admit: 2018-03-01 | Payer: Medicaid Other | Source: Ambulatory Visit

## 2018-03-04 ENCOUNTER — Other Ambulatory Visit: Payer: Self-pay

## 2018-03-04 ENCOUNTER — Ambulatory Visit (HOSPITAL_COMMUNITY): Payer: Medicaid Other | Admitting: Certified Registered Nurse Anesthetist

## 2018-03-04 ENCOUNTER — Encounter (HOSPITAL_COMMUNITY)
Admission: AD | Disposition: A | Discharge status: Home / Self Care | Payer: Self-pay | Source: Ambulatory Visit | Attending: Obstetrics and Gynecology

## 2018-03-04 ENCOUNTER — Ambulatory Visit (HOSPITAL_COMMUNITY)
Admission: AD | Admit: 2018-03-04 | Discharge: 2018-03-04 | Disposition: A | Discharge status: Home / Self Care | Payer: Medicaid Other | Source: Ambulatory Visit | Attending: Obstetrics and Gynecology | Admitting: Obstetrics and Gynecology

## 2018-03-04 ENCOUNTER — Encounter (HOSPITAL_COMMUNITY): Payer: Self-pay | Admitting: Emergency Medicine

## 2018-03-04 DIAGNOSIS — F209 Schizophrenia, unspecified: Secondary | ICD-10-CM | POA: Insufficient documentation

## 2018-03-04 DIAGNOSIS — F17210 Nicotine dependence, cigarettes, uncomplicated: Secondary | ICD-10-CM | POA: Insufficient documentation

## 2018-03-04 DIAGNOSIS — F319 Bipolar disorder, unspecified: Secondary | ICD-10-CM | POA: Insufficient documentation

## 2018-03-04 DIAGNOSIS — Z79899 Other long term (current) drug therapy: Secondary | ICD-10-CM | POA: Diagnosis not present

## 2018-03-04 DIAGNOSIS — N840 Polyp of corpus uteri: Secondary | ICD-10-CM | POA: Insufficient documentation

## 2018-03-04 DIAGNOSIS — G4730 Sleep apnea, unspecified: Secondary | ICD-10-CM | POA: Diagnosis present

## 2018-03-04 DIAGNOSIS — N920 Excessive and frequent menstruation with regular cycle: Secondary | ICD-10-CM | POA: Insufficient documentation

## 2018-03-04 DIAGNOSIS — N939 Abnormal uterine and vaginal bleeding, unspecified: Secondary | ICD-10-CM | POA: Diagnosis not present

## 2018-03-04 HISTORY — PX: DILITATION & CURRETTAGE/HYSTROSCOPY WITH NOVASURE ABLATION: SHX5568

## 2018-03-04 LAB — PREGNANCY, URINE: Preg Test, Ur: NEGATIVE

## 2018-03-04 LAB — BASIC METABOLIC PANEL
Anion gap: 9 (ref 5–15)
BUN: 9 mg/dL (ref 6–20)
CALCIUM: 8.9 mg/dL (ref 8.9–10.3)
CO2: 25 mmol/L (ref 22–32)
Chloride: 107 mmol/L (ref 98–111)
Creatinine, Ser: 1.07 mg/dL — ABNORMAL HIGH (ref 0.44–1.00)
GFR calc Af Amer: >60 mL/min (ref >60)
Glucose, Bld: 105 mg/dL — ABNORMAL HIGH (ref 70–99)
POTASSIUM: 3.8 mmol/L (ref 3.5–5.1)
Sodium: 141 mmol/L (ref 135–145)

## 2018-03-04 LAB — CBC
HCT: 41 % (ref 36.0–46.0)
Hemoglobin: 13.5 g/dL (ref 12.0–15.0)
MCH: 28.6 pg (ref 26.0–34.0)
MCHC: 32.9 g/dL (ref 30.0–36.0)
MCV: 86.9 fL (ref 80.0–100.0)
PLATELETS: 305 10*3/uL (ref 150–400)
RBC: 4.72 MIL/uL (ref 3.87–5.11)
RDW: 14.3 % (ref 11.5–15.5)
WBC: 9.3 10*3/uL (ref 4.0–10.5)
nRBC: 0 % (ref 0.0–0.2)

## 2018-03-04 SURGERY — DILATATION & CURETTAGE/HYSTEROSCOPY WITH NOVASURE ABLATION
Anesthesia: General | Site: Vagina

## 2018-03-04 MED ORDER — SCOPOLAMINE 1 MG/3DAYS TD PT72
1.0000 | MEDICATED_PATCH | Freq: Once | TRANSDERMAL | Status: DC
  Administered 2018-03-04: 1.5 mg via TRANSDERMAL

## 2018-03-04 MED ORDER — ACETAMINOPHEN 500 MG PO TABS: 500.0000 mg | ORAL_TABLET | Freq: Four times a day (QID) | ORAL | 0 refills | Status: DC | PRN

## 2018-03-04 MED ORDER — ONDANSETRON HCL 4 MG/2ML IJ SOLN: 4.0000 mg | Freq: Once | INTRAMUSCULAR | Status: DC | PRN

## 2018-03-04 MED ORDER — OXYCODONE HCL 5 MG PO TABS
ORAL_TABLET | ORAL | Status: AC
  Filled 2018-03-04: qty 1

## 2018-03-04 MED ORDER — OXYCODONE HCL 5 MG PO TABS: 5.0000 mg | ORAL_TABLET | ORAL | 0 refills | Status: DC | PRN

## 2018-03-04 MED ORDER — SCOPOLAMINE 1 MG/3DAYS TD PT72
MEDICATED_PATCH | TRANSDERMAL | Status: AC
  Administered 2018-03-04: 1.5 mg via TRANSDERMAL
  Filled 2018-03-04: qty 1

## 2018-03-04 MED ORDER — DEXAMETHASONE SODIUM PHOSPHATE 4 MG/ML IJ SOLN
INTRAMUSCULAR | Status: AC
  Filled 2018-03-04: qty 1

## 2018-03-04 MED ORDER — LIDOCAINE HCL (CARDIAC) PF 100 MG/5ML IV SOSY
PREFILLED_SYRINGE | INTRAVENOUS | Status: DC | PRN
  Administered 2018-03-04: 60 mg via INTRAVENOUS

## 2018-03-04 MED ORDER — FENTANYL CITRATE (PF) 100 MCG/2ML IJ SOLN
INTRAMUSCULAR | Status: AC
  Filled 2018-03-04: qty 2

## 2018-03-04 MED ORDER — GLYCOPYRROLATE 0.2 MG/ML IJ SOLN
INTRAMUSCULAR | Status: DC | PRN
  Administered 2018-03-04: 0.1 mg via INTRAVENOUS

## 2018-03-04 MED ORDER — PROPOFOL 10 MG/ML IV BOLUS
INTRAVENOUS | Status: DC | PRN
  Administered 2018-03-04: 200 mg via INTRAVENOUS

## 2018-03-04 MED ORDER — GLYCOPYRROLATE 0.2 MG/ML IJ SOLN
INTRAMUSCULAR | Status: AC
  Filled 2018-03-04: qty 1

## 2018-03-04 MED ORDER — MIDAZOLAM HCL 2 MG/2ML IJ SOLN
INTRAMUSCULAR | Status: DC | PRN
  Administered 2018-03-04: 2 mg via INTRAVENOUS

## 2018-03-04 MED ORDER — DEXAMETHASONE SODIUM PHOSPHATE 4 MG/ML IJ SOLN
INTRAMUSCULAR | Status: DC | PRN
  Administered 2018-03-04: 4 mg via INTRAVENOUS

## 2018-03-04 MED ORDER — MIDAZOLAM HCL 2 MG/2ML IJ SOLN
INTRAMUSCULAR | Status: AC
  Filled 2018-03-04: qty 2

## 2018-03-04 MED ORDER — OXYCODONE HCL 5 MG PO TABS
5.0000 mg | ORAL_TABLET | Freq: Once | ORAL | Status: AC | PRN
  Administered 2018-03-04: 5 mg via ORAL

## 2018-03-04 MED ORDER — PROPOFOL 10 MG/ML IV BOLUS
INTRAVENOUS | Status: AC
  Filled 2018-03-04: qty 20

## 2018-03-04 MED ORDER — LIDOCAINE HCL (CARDIAC) PF 100 MG/5ML IV SOSY
PREFILLED_SYRINGE | INTRAVENOUS | Status: AC
  Filled 2018-03-04: qty 5

## 2018-03-04 MED ORDER — LACTATED RINGERS IV SOLN
INTRAVENOUS | Status: DC
  Administered 2018-03-04: 11:00:00 via INTRAVENOUS

## 2018-03-04 MED ORDER — KETOROLAC TROMETHAMINE 30 MG/ML IJ SOLN
INTRAMUSCULAR | Status: AC
  Filled 2018-03-04: qty 1

## 2018-03-04 MED ORDER — FENTANYL CITRATE (PF) 100 MCG/2ML IJ SOLN
INTRAMUSCULAR | Status: DC | PRN
  Administered 2018-03-04 (×2): 50 ug via INTRAVENOUS

## 2018-03-04 MED ORDER — OXYCODONE HCL 5 MG/5ML PO SOLN: 5.0000 mg | Freq: Once | ORAL | Status: AC | PRN

## 2018-03-04 MED ORDER — LACTATED RINGERS IV SOLN: INTRAVENOUS | Status: DC

## 2018-03-04 MED ORDER — ONDANSETRON HCL 4 MG/2ML IJ SOLN
INTRAMUSCULAR | Status: DC | PRN
  Administered 2018-03-04: 4 mg via INTRAVENOUS

## 2018-03-04 MED ORDER — FENTANYL CITRATE (PF) 100 MCG/2ML IJ SOLN: 25.0000 ug | INTRAMUSCULAR | Status: DC | PRN

## 2018-03-04 MED ORDER — ONDANSETRON HCL 4 MG/2ML IJ SOLN
INTRAMUSCULAR | Status: AC
  Filled 2018-03-04: qty 2

## 2018-03-04 SURGICAL SUPPLY — 15 items
ABLATOR SURESOUND NOVASURE (ABLATOR) ×3 IMPLANT
CANISTER SUCT 3000ML PPV (MISCELLANEOUS) ×3 IMPLANT
CATH ROBINSON RED A/P 16FR (CATHETERS) ×3 IMPLANT
GLOVE BIOGEL PI IND STRL 6.5 (GLOVE) ×1 IMPLANT
GLOVE BIOGEL PI IND STRL 7.0 (GLOVE) ×1 IMPLANT
GLOVE BIOGEL PI INDICATOR 6.5 (GLOVE) ×2
GLOVE BIOGEL PI INDICATOR 7.0 (GLOVE) ×2
GLOVE ORTHOPEDIC STR SZ6.5 (GLOVE) ×3 IMPLANT
GOWN STRL REUS W/TWL LRG LVL3 (GOWN DISPOSABLE) ×6 IMPLANT
PACK VAGINAL MINOR WOMEN LF (CUSTOM PROCEDURE TRAY) ×3 IMPLANT
PAD OB MATERNITY 4.3X12.25 (PERSONAL CARE ITEMS) ×3 IMPLANT
PAD PREP 24X48 CUFFED NSTRL (MISCELLANEOUS) ×3 IMPLANT
TOWEL OR 17X24 6PK STRL BLUE (TOWEL DISPOSABLE) ×6 IMPLANT
TUBING AQUILEX INFLOW (TUBING) ×3 IMPLANT
TUBING AQUILEX OUTFLOW (TUBING) ×3 IMPLANT

## 2018-03-04 NOTE — Anesthesia Postprocedure Evaluation (Signed)
Anesthesia Post Note  Patient: Temia Malatesta  Procedure(s) Performed: DILATATION & CURETTAGE/HYSTEROSCOPY WITH NOVASURE ABLATION (N/A Vagina )     Patient location during evaluation: PACU Anesthesia Type: General Level of consciousness: awake and alert Pain management: pain level controlled Vital Signs Assessment: post-procedure vital signs reviewed and stable Respiratory status: spontaneous breathing, nonlabored ventilation and respiratory function stable Cardiovascular status: blood pressure returned to baseline and stable Postop Assessment: no apparent nausea or vomiting Anesthetic complications: no    Last Vitals:  Vitals:   03/04/18 1400 03/04/18 1415  BP: (!) 100/49 110/72  Pulse: 87 72  Resp: 16 17  Temp:    SpO2: 95% 98%    Last Pain:  Vitals:   03/04/18 1415  TempSrc:   PainSc: 2    Pain Goal: Patients Stated Pain Goal: 3 (03/04/18 1107)               Lucretia Kern

## 2018-03-04 NOTE — Anesthesia Procedure Notes (Deleted)
Procedure Name: LMA Insertion Date/Time: 03/04/2018 12:58 PM Performed by: Witman, Carolyn E, MD Pre-anesthesia Checklist: Patient identified, Patient being monitored, Emergency Drugs available, Timeout performed and Suction available Patient Re-evaluated:Patient Re-evaluated prior to induction Oxygen Delivery Method: Circle System Utilized Preoxygenation: Pre-oxygenation with 100% oxygen Induction Type: IV induction Ventilation: Mask ventilation without difficulty LMA: LMA inserted LMA Size: 4.0 Number of attempts: 1 Placement Confirmation: positive ETCO2 and breath sounds checked- equal and bilateral       

## 2018-03-04 NOTE — Discharge Instructions (Signed)
Endometrial Ablation °Endometrial ablation is a procedure that destroys the thin inner layer of the lining of the uterus (endometrium). This procedure may be done: °· To stop heavy periods. °· To stop bleeding that is causing anemia. °· To control irregular bleeding. °· To treat bleeding caused by small tumors (fibroids) in the endometrium. ° °This procedure is often an alternative to major surgery, such as removal of the uterus and cervix (hysterectomy). As a result of this procedure: °· You may not be able to have children. However, if you are premenopausal (you have not gone through menopause): °? You may still have a small chance of getting pregnant. °? You will need to use a reliable method of birth control after the procedure to prevent pregnancy. °· You may stop having a menstrual period, or you may have only a small amount of bleeding during your period. Menstruation may return several years after the procedure. ° °Tell a health care provider about: °· Any allergies you have. °· All medicines you are taking, including vitamins, herbs, eye drops, creams, and over-the-counter medicines. °· Any problems you or family members have had with the use of anesthetic medicines. °· Any blood disorders you have. °· Any surgeries you have had. °· Any medical conditions you have. °What are the risks? °Generally, this is a safe procedure. However, problems may occur, including: °· A hole (perforation) in the uterus or bowel. °· Infection of the uterus, bladder, or vagina. °· Bleeding. °· Damage to other structures or organs. °· An air bubble in the lung (air embolus). °· Problems with pregnancy after the procedure. °· Failure of the procedure. °· Decreased ability to diagnose cancer in the endometrium. ° °What happens before the procedure? °· You will have tests of your endometrium to make sure there are no pre-cancerous cells or cancer cells present. °· You may have an ultrasound of the uterus. °· You may be given  medicines to thin the endometrium. °· Ask your health care provider about: °? Changing or stopping your regular medicines. This is especially important if you take diabetes medicines or blood thinners. °? Taking medicines such as aspirin and ibuprofen. These medicines can thin your blood. Do not take these medicines before your procedure if your doctor tells you not to. °· Plan to have someone take you home from the hospital or clinic. °What happens during the procedure? °· You will lie on an exam table with your feet and legs supported as in a pelvic exam. °· To lower your risk of infection: °? Your health care team will wash or sanitize their hands and put on germ-free (sterile) gloves. °? Your genital area will be washed with soap. °· An IV tube will be inserted into one of your veins. °· You will be given a medicine to help you relax (sedative). °· A surgical instrument with a light and camera (resectoscope) will be inserted into your vagina and moved into your uterus. This allows your surgeon to see inside your uterus. °· Endometrial tissue will be removed using one of the following methods: °? Radiofrequency. This method uses a radiofrequency-alternating electric current to remove the endometrium. °? Cryotherapy. This method uses extreme cold to freeze the endometrium. °? Heated-free liquid. This method uses a heated saltwater (saline) solution to remove the endometrium. °? Microwave. This method uses high-energy microwaves to heat up the endometrium and remove it. °? Thermal balloon. This method involves inserting a catheter with a balloon tip into the uterus. The balloon tip is   filled with heated fluid to remove the endometrium. °The procedure may vary among health care providers and hospitals. °What happens after the procedure? °· Your blood pressure, heart rate, breathing rate, and blood oxygen level will be monitored until the medicines you were given have worn off. °· As tissue healing occurs, you may  notice vaginal bleeding for 4-6 weeks after the procedure. You may also experience: °? Cramps. °? Thin, watery vaginal discharge that is light pink or brown in color. °? A need to urinate more frequently than usual. °? Nausea. °· Do not drive for 24 hours if you were given a sedative. °· Do not have sex or insert anything into your vagina until your health care provider approves. °Summary °· Endometrial ablation is done to treat the many causes of heavy menstrual bleeding. °· The procedure may be done only after medications have been tried to control the bleeding. °· Plan to have someone take you home from the hospital or clinic. °This information is not intended to replace advice given to you by your health care provider. Make sure you discuss any questions you have with your health care provider. °Document Released: 03/10/2004 Document Revised: 05/18/2016 Document Reviewed: 05/18/2016 °Elsevier Interactive Patient Education © 2017 [employer] °Hysteroscopy, Care After °Refer to this sheet in the next few weeks. These instructions provide you with information on caring for yourself after your procedure. Your health care provider may also give you more specific instructions. Your treatment has been planned according to current medical practices, but problems sometimes occur. Call your health care provider if you have any problems or questions after your procedure. °What can I expect after the procedure? °After your procedure, it is typical to have the following: °· You may have some cramping. This normally lasts for a couple days. °· You may have bleeding. This can vary from light spotting for a few days to menstrual-like bleeding for 3-7 days. ° °Follow these instructions at home: °· Rest for the first 1-2 days after the procedure. °· Only take over-the-counter or prescription medicines as directed by your health care provider. Do not take aspirin. It can increase the chances of bleeding. °· Take showers instead  of baths for 2 weeks or as directed by your health care provider. °· Do not drive for 24 hours or as directed. °· Do not drink alcohol while taking pain medicine. °· Do not use tampons, douche, or have sexual intercourse for 2 weeks or until your health care provider says it is okay. °· Take your temperature twice a day for 4-5 days. Write it down each time. °· Follow your health care provider's advice about diet, exercise, and lifting. °· If you develop constipation, you may: °? Take a mild laxative if your health care provider approves. °? Add bran foods to your diet. °? Drink enough fluids to keep your urine clear or pale yellow. °· Try to have someone with you or available to you for the first 24-48 hours, especially if you were given a general anesthetic. °· Follow up with your health care provider as directed. °Contact a health care provider if: °· You feel dizzy or lightheaded. °· You feel sick to your stomach (nauseous). °· You have abnormal vaginal discharge. °· You have a rash. °· You have pain that is not controlled with medicine. °Get help right away if: °· You have bleeding that is heavier than a normal menstrual period. °· You have a fever. °· You have increasing cramps or pain, not   controlled with medicine. °· You have new belly (abdominal) pain. °· You pass out. °· You have pain in the tops of your shoulders (shoulder strap areas). °· You have shortness of breath. °This information is not intended to replace advice given to you by your health care provider. Make sure you discuss any questions you have with your health care provider. °Document Released: 02/19/2013 Document Revised: 10/07/2015 Document Reviewed: 11/28/2012 °Elsevier Interactive Patient Education © 2017 [employer] Inc[employer] ° ° °Post Anesthesia Home Care Instructions ° °Activity: °Get plenty of rest for the remainder of the day. A responsible individual must stay with you for 24 hours following the procedure.  °For the next 24 hours, DO  NOT: °-Drive a car °-Operate machinery °-Drink alcoholic beverages °-Take any medication unless instructed by your physician °-Make any legal decisions or sign important papers. ° °Meals: °Start with liquid foods such as gelatin or soup. Progress to regular foods as tolerated. Avoid greasy, spicy, heavy foods. If nausea and/or vomiting occur, drink only clear liquids until the nausea and/or vomiting subsides. Call your physician if vomiting continues. ° °Special Instructions/Symptoms: °Your throat may feel dry or sore from the anesthesia or the breathing tube placed in your throat during surgery. If this causes discomfort, gargle with warm salt water. The discomfort should disappear within 24 hours. ° °If you had a scopolamine patch placed behind your ear for the management of post- operative nausea and/or vomiting: ° °1. The medication in the patch is effective for 72 hours, after which it should be removed.  Wrap patch in a tissue and discard in the trash. Wash hands thoroughly with soap and water. °2. You may remove the patch earlier than 72 hours if you experience unpleasant side effects which may include dry mouth, dizziness or visual disturbances. °3. Avoid touching the patch. Wash your hands with soap and water after contact with the patch. °  ° ° °

## 2018-03-04 NOTE — Anesthesia Preprocedure Evaluation (Signed)
Anesthesia Evaluation  Patient identified by MRN, date of birth, ID band Patient awake    Reviewed: Allergy & Precautions, NPO status , Patient's Chart, lab work & pertinent test results  History of Anesthesia Complications Negative for: history of anesthetic complications  Airway Mallampati: I  TM Distance: >3 FB Neck ROM: Full    Dental no notable dental hx.    Pulmonary sleep apnea (mild- CPAP not recommended) , Current Smoker,    Pulmonary exam normal        Cardiovascular negative cardio ROS Normal cardiovascular exam     Neuro/Psych PSYCHIATRIC DISORDERS Bipolar Disorder Schizophrenia negative neurological ROS     GI/Hepatic negative GI ROS, Neg liver ROS,   Endo/Other  negative endocrine ROS  Renal/GU negative Renal ROS  negative genitourinary   Musculoskeletal negative musculoskeletal ROS (+)   Abdominal (+) + obese,   Peds  Hematology negative hematology ROS (+)   Anesthesia Other Findings   Reproductive/Obstetrics                            Anesthesia Physical Anesthesia Plan  ASA: II  Anesthesia Plan: General   Post-op Pain Management:    Induction: Intravenous  PONV Risk Score and Plan: 3 and Ondansetron, Dexamethasone, Treatment may vary due to age or medical condition and Midazolam  Airway Management Planned: LMA  Additional Equipment: None  Intra-op Plan:   Post-operative Plan: Extubation in OR  Informed Consent: I have reviewed the patients History and Physical, chart, labs and discussed the procedure including the risks, benefits and alternatives for the proposed anesthesia with the patient or authorized representative who has indicated his/her understanding and acceptance.     Plan Discussed with:   Anesthesia Plan Comments:         Anesthesia Quick Evaluation

## 2018-03-04 NOTE — Anesthesia Procedure Notes (Signed)
Procedure Name: LMA Insertion Date/Time: 03/04/2018 12:58 PM Performed by: Lucretia Kern, MD Pre-anesthesia Checklist: Patient identified, Patient being monitored, Emergency Drugs available, Timeout performed and Suction available Patient Re-evaluated:Patient Re-evaluated prior to induction Oxygen Delivery Method: Circle System Utilized Preoxygenation: Pre-oxygenation with 100% oxygen Induction Type: IV induction Ventilation: Mask ventilation without difficulty LMA: LMA inserted LMA Size: 4.0 Number of attempts: 1 Placement Confirmation: positive ETCO2 and breath sounds checked- equal and bilateral

## 2018-03-04 NOTE — H&P (Signed)
OB/GYN History and Physical  Beverly Wong is a 37 y.o. [id number] presenting for D&C hysteroscopy, polypectomy, endometrial ablation. She has been having irregular periods, some heavier than others. She affirms desire to proceed.      Past Medical History:  Diagnosis Date  . Bipolar disorder ([place])   . Headache   . Schizophrenia ([place])   . Shortness of breath dyspnea    feels short of breath at times even when seated  . UTI (lower urinary tract infection)     Past Surgical History:  Procedure Laterality Date  . ANTERIOR CERVICAL DECOMP/DISCECTOMY FUSION N/A 06/21/2015   Procedure: Cervical six- seven Anterior Cervical Decompression/ Diskectomy/ Fusion;  Surgeon: Kevan Ny Ditty, MD;  Location: [facility];  Service: Neurosurgery;  Laterality: N/A;  C6-7 Anterior cervical decompression/diskectomy/fusion  . TUBAL LIGATION  2009    OB History  Gravida Para Term Preterm AB Living  _0 SAB TAB Ectopic Multiple Live Births  _1 # Outcome Date GA Lbr Len/2nd Weight Sex Delivery Anes PTL Lv  3 Term      Vag-Spont   LIV  2 TAB           1 SAB             Social History   Socioeconomic History  . Marital status: Married    Spouse name: Not on file  . Number of children: Not on file  . Years of education: Not on file  . Highest education level: Not on file  Occupational History  . Not on file  Social Needs  . Financial resource strain: Not on file  . Food insecurity:    Worry: Not on file    Inability: Not on file  . Transportation needs:    Medical: Not on file    Non-medical: Not on file  Tobacco Use  . Smoking status: Current Every Day Smoker    Packs/day: 1.00    Years: 17.00    Pack years: 17.00    Types: Cigarettes  . Smokeless tobacco: Never Used  Substance and Sexual Activity  . Alcohol use: Yes    Comment: on ocaasion.   . Drug use: Yes    Types: Cocaine, Marijuana    Comment: uses cocaine occasionally, smokes marajuana QD.  Marland Kitchen  Sexual activity: Yes    Partners: Male    Birth control/protection: Surgical  Lifestyle  . Physical activity:    Days per week: Not on file    Minutes per session: Not on file  . Stress: Not on file  Relationships  . Social connections:    Talks on phone: Not on file    Gets together: Not on file    Attends religious service: Not on file    Active member of club or organization: Not on file    Attends meetings of clubs or organizations: Not on file    Relationship status: Not on file  Other Topics Concern  . Not on file  Social History Narrative  . Not on file    Family History  Problem Relation Age of Onset  . Diabetes Mother   . Kidney cancer Father   . Breast cancer Maternal Aunt     Medications Prior to Admission  Medication Sig Dispense Refill Last Dose  . ARIPiprazole ER (ABILIFY MAINTENA) 400 MG PRSY prefilled syringe Inject 400 mg into the muscle every 28 (twenty-eight) days.  Past Week at Unknown time  . metroNIDAZOLE (METROGEL VAGINAL) 0.75 % vaginal gel Place 1 Applicatorful vaginally 2 (two) times daily. Use twice a month for 6 months - for recurrent BV. (Patient not taking: Reported on 02/18/2018) 70 g 12 Completed Course at Unknown time    Allergies  Allergen Reactions  . Ibuprofen Other (See Comments)    Can't take it with the Abilify she is on    Review of Systems: Negative except for what is mentioned in HPI.     Physical Exam: BP (!) 107/47   Pulse 64   Temp 97.7 F (36.5 C) (Oral)   Resp 16   Ht _0  (1.727 m)   Wt 100.2 kg   LMP 03/03/2018   SpO2 99%   BMI 33.60 kg/m  CONSTITUTIONAL: Well-developed, well-nourished female in no acute distress.  HENT:  Normocephalic, atraumatic, External right and left ear normal. Oropharynx is clear and moist EYES: Conjunctivae and EOM are normal. Pupils are equal, round, and reactive to light. No scleral icterus.  NECK: Normal range of motion, supple, no masses SKIN: Skin is warm and dry. No rash  noted. Not diaphoretic. No erythema. No pallor. [place]: Alert and oriented to person, place, and time. Normal reflexes, muscle tone coordination. No cranial nerve deficit noted. PSYCHIATRIC: Normal mood and affect. Normal behavior. Normal judgment and thought content. CARDIOVASCULAR: Normal heart rate noted, regular rhythm RESPIRATORY: Effort and breath sounds normal, no problems with respiration noted ABDOMEN: Soft, nontender, nondistended, gravid.  PELVIC: Deferred MUSCULOSKELETAL: Normal range of motion. No edema and no tenderness. 2+ distal pulses.   Pertinent Labs/Studies:   Results for orders placed or performed during the hospital encounter of 03/04/18 (from the past 72 hour(s))  Pregnancy, urine     Status: None   Collection Time: 03/04/18 10:59 AM  Result Value Ref Range   Preg Test, Ur NEGATIVE NEGATIVE    Comment: Performed at [facility], [address], [place], [place] [address]  CBC     Status: None   Collection Time: 03/04/18 11:00 AM  Result Value Ref Range   WBC 9.3 4.0 - 10.5 K/uL   RBC 4.72 3.87 - 5.11 MIL/uL   Hemoglobin 13.5 12.0 - 15.0 g/dL   HCT 41.0 36.0 - 46.0 %   MCV 86.9 80.0 - 100.0 fL   MCH 28.6 26.0 - 34.0 pg   MCHC 32.9 30.0 - 36.0 g/dL   RDW 14.3 11.5 - 15.5 %   Platelets 305 150 - 400 K/uL   nRBC 0.0 0.0 - 0.2 %    Comment: Performed at [facility], [address], [place], [place] [address]  Basic metabolic panel     Status: Abnormal   Collection Time: 03/04/18 11:00 AM  Result Value Ref Range   Sodium 141 135 - 145 mmol/L   Potassium 3.8 3.5 - 5.1 mmol/L   Chloride 107 98 - 111 mmol/L   CO2 25 22 - 32 mmol/L   Glucose, Bld 105 (H) 70 - 99 mg/dL   BUN 9 6 - 20 mg/dL   Creatinine, Ser 1.07 (H) 0.44 - 1.00 mg/dL   Calcium 8.9 8.9 - 10.3 mg/dL   GFR calc non Af Amer >60 >60 mL/min   GFR calc Af Amer >60 >60 mL/min    Comment: (NOTE) The eGFR has been calculated using the CKD EPI equation. This calculation has not been  validated in all clinical situations. eGFR's persistently <60 mL/min signify possible Chronic Kidney Disease.  Anion gap 9 5 - 15    Comment: Performed at [facility], [address] [place], [place] [address]   Diagnosis Endometrium, biopsy - BENIGN SECRETORY ENDOMETRIUM AND BENIGN ENDOMETRIAL POLYP. - NO HYPERPLASIA OR MALIGNANCY IDENTIFIED. Claudette Laws MD Pathologist, Electronic Signature (Case signed 10/19/2017)  CLINICAL DATA:  Abnormal uterine bleeding  EXAM: TRANSABDOMINAL AND TRANSVAGINAL ULTRASOUND OF PELVIS  TECHNIQUE: Both transabdominal and transvaginal ultrasound examinations of the pelvis were performed. Transabdominal technique was performed for global imaging of the pelvis including uterus, ovaries, adnexal regions, and pelvic cul-de-sac. It was necessary to proceed with endovaginal exam following the transabdominal exam to visualize the uterus, endometrium, ovaries and adnexa.  COMPARISON:  None  FINDINGS: Uterus  Measurements: 9.3 x 4.7 x 5.8 cm. No fibroids or other mass visualized.  Endometrium  Thickness: 7 mm in thickness.  No focal abnormality visualized.  Right ovary  Measurements: 4.3 x 2.3 x 2.7 cm. Normal appearance/no adnexal mass.  Left ovary  Measurements: 3.7 x 2.2 x 2.2 cm. Small adnexal cyst medial to the left ovary measures 2.1 cm and appears benign.  Other findings  No abnormal free fluid.  IMPRESSION: Small left paraovarian cyst, 2.1 cm.  Normal thickness of the endometrium for premenopausal patient. If bleeding remains unresponsive to hormonal or medical therapy, sonohysterogram should be considered for focal lesion work-up. (Ref: Radiological Reasoning: Algorithmic Workup of Abnormal Vaginal Bleeding with Endovaginal Sonography and Sonohysterography. AJR 2008; 122:U41-14)   Electronically Signed   By: Rolm Baptise M.D.   On: 10/11/2017 10:52     Assessment and Plan :Beverly Wong is  a 37 y.o. [id number] who presents for [facility] hysteroscopy, polypectomy, endometrial ablation for AUB, menorrhagia, endometrial polyp, desiring surgical management. Patient has been counseled regarding options and desires endometrial ablation. Reviewed risks/benefits of endometrial ablation including infection, hemorrhage, damage to surrounding tissue and organs, possibility of not ablating all tissue and that she may continue to have periods, possibility of stenotic cervix causing difficulty with future endometrial sampling, possibility of increased pain, particularly if not all tissue is ablated and cervix is stenotic. Reviewed she may need further management if ablation fails. I reviewed that she will not be able to achieve pregnancy after endometrial ablation is done. She verbalizes understanding of the above, consent signed. She is agreeable to blood transfusion in the event of emergency. Will proceed with D&C hysteroscopy, polypectomy, endometrial ablation.    Plan for [facility] hysteroscopy, polypectomy, endometrial ablation NPO Admission labs ordered VS Q4    K. Arvilla Meres, M.D. Attending [place], [facility] for [employer], [place]

## 2018-03-04 NOTE — Transfer of Care (Signed)
Immediate Anesthesia Transfer of Care Note  Patient: Beverly Wong  Procedure(s) Performed: DILATATION & CURETTAGE/HYSTEROSCOPY WITH NOVASURE ABLATION (N/A Vagina )  Patient Location: PACU  Anesthesia Type:General  Level of Consciousness: awake, alert  and oriented  Airway & Oxygen Therapy: Patient Spontanous Breathing and Patient connected to nasal cannula oxygen  Post-op Assessment: Report given to RN, Post -op Vital signs reviewed and stable and Patient moving all extremities X 4  Post vital signs: Reviewed and stable  Last Vitals:  Vitals Value Taken Time  BP 96/43 03/04/2018  1:45 PM  Temp    Pulse 83 03/04/2018  1:49 PM  Resp 17 03/04/2018  1:49 PM  SpO2 100 % 03/04/2018  1:49 PM  Vitals shown include unvalidated device data.  Last Pain:  Vitals:   03/04/18 1107  TempSrc: Oral      Patients Stated Pain Goal: 3 (03/04/18 1107)  Complications: No apparent anesthesia complications

## 2018-03-04 NOTE — Op Note (Signed)
NovaSure Endometrial Ablation  PREOPERATIVE DIAGNOSIS:  Irregular uterine bleeding POSTOPERATIVE DIAGNOSIS: The same PROCEDURE:  dilation & curettage, hysteroscopy, polypectomy, NovaSure Endometrial Ablation SURGEON:  Ivory Broad. Earlene Plater, MD  INDICATIONS: 37 y.o. [id number]  here for NovaSure Endometrial Ablation.  Risks of surgery were discussed with the patient including but not limited to: bleeding which may require transfusion; infection which may require antibiotics; injury to uterus leading to risk of injury to surrounding intraperitoneal organs, need for additional procedures including laparoscopy or laparotomy, and other postoperative/anesthesia complications. Written informed consent was obtained.   FINDINGS:  A 9 week size uterus.  Diffuse proliferative endometrium. Small posterior uterine wall polyp. Normal ostia bilaterally.  ANESTHESIA:   General anesthesia URINE OUTPUT:  50 mL clear yellow urine INTRAVENOUS FLUIDS:  900 ml of LR FLUID DEFICITS:  125 ml of Lactated Ringers ESTIMATED BLOOD LOSS:  Less than 20 ml SPECIMENS: Endometrial curettings sent to pathology COMPLICATIONS:  None immediate  PROCEDURE DETAILS:  The patient was taken to the operating room where general anesthesia was administered and was found to be adequate.  After an adequate timeout was performed, she was placed in the dorsal lithotomy position and examined; then prepped and draped in the sterile manner.   Her bladder was catheterized for clear yellow urine. A speculum was then placed in the patient's vagina and a single tooth tenaculum was applied to the anterior lip of the cervix.  The sound was used to obtain the cervical and uterine cavity length measurements at 4 cm and 4 cm respectively; total sounding length 8 cm.  The cervix was dilated manually with Hegar dilators to accommodate the diagnostic hysteroscope.  Once the cervix was dilated, the hysteroscope was inserted under direct visualization.   The hysteroscope  was also used to determine the level of the internal os, and measurements were confirmed. The uterine cavity was carefully examined, both ostia were recognized, and diffusely proliferative endometrium was noted, small posterior wall polyp noted. The hysteroscope was removed and a curettage was done to obtain some endometrial curettings. The hysteroscope was replaced and the polyp was noted to be removed.  The cervix was further dilated to accommodate the NovaSure device.  The NovaSure device was inserted, and a cavity width of 4 cm was determined. Using a power of 80 watts, for 68 sec, the endometrial ablation was performed. The hysteroscope was then re-introduced into the uterine cavity, confirming complete ablation of the endometrium. The tenaculum was removed from the anterior lip of the cervix, and the vaginal speculum was removed after noting good hemostasis after application of silver nitrate.  The patient tolerated the procedure well and was taken to the recovery area awake, extubated and in stable condition.  The patient will be discharged to home as per PACU criteria.  Routine postoperative instructions given.  She was prescribed Roxicodone, tylenol.  She will follow up in the clinic in 2 weeks for postoperative evaluation.   Baldemar Lenis, M.D. [facility] for [employer]

## 2018-03-05 ENCOUNTER — Encounter (HOSPITAL_COMMUNITY): Payer: Self-pay | Admitting: Obstetrics and Gynecology

## 2018-03-27 ENCOUNTER — Other Ambulatory Visit (HOSPITAL_COMMUNITY)
Admission: RE | Admit: 2018-03-27 | Discharge: 2018-03-27 | Disposition: A | Discharge status: Home / Self Care | Payer: Medicaid Other | Source: Ambulatory Visit | Attending: Obstetrics and Gynecology | Admitting: Obstetrics and Gynecology

## 2018-03-27 ENCOUNTER — Ambulatory Visit (INDEPENDENT_AMBULATORY_CARE_PROVIDER_SITE_OTHER): Payer: Medicaid Other | Admitting: Obstetrics and Gynecology

## 2018-03-27 ENCOUNTER — Encounter: Payer: Self-pay | Admitting: Obstetrics and Gynecology

## 2018-03-27 VITALS — BP 111/77 | HR 74 | Wt 221.0 lb

## 2018-03-27 DIAGNOSIS — N898 Other specified noninflammatory disorders of vagina: Secondary | ICD-10-CM | POA: Insufficient documentation

## 2018-03-27 DIAGNOSIS — Z9889 Other specified postprocedural states: Secondary | ICD-10-CM | POA: Diagnosis present

## 2018-03-27 DIAGNOSIS — N939 Abnormal uterine and vaginal bleeding, unspecified: Secondary | ICD-10-CM

## 2018-03-27 NOTE — Addendum Note (Signed)
Addended by: Marya LandryFOSTER, Henessy Rohrer D on: 03/27/2018 02:42 PM   Modules accepted: Orders

## 2018-03-27 NOTE — Progress Notes (Signed)
Pt complaints of yellow discharge with odor today. Would like to know if/when she may use Metrogel.

## 2018-03-27 NOTE — Progress Notes (Signed)
GYNECOLOGY OFFICE FOLLOW UP NOTE  History:  37 y.o. [id number] here today for follow up for vaginal discharge and post op visit for [facility] hysteroscopy, polypectomy, Novasure ablation on 03/04/18. She is doing well, had minimal cramping after procedure that has now stopped. No bleeding since procedure. Normal appetite and bowel movements. Only complaint is discharge and odor, she thinks she has a BV infection.    Past Medical History:  Diagnosis Date  . Bipolar disorder ([facility])   . Headache   . Schizophrenia ([facility])   . Shortness of breath dyspnea    feels short of breath at times even when seated  . UTI (lower urinary tract infection)     Past Surgical History:  Procedure Laterality Date  . ANTERIOR CERVICAL DECOMP/DISCECTOMY FUSION N/A 06/21/2015   Procedure: Cervical six- seven Anterior Cervical Decompression/ Diskectomy/ Fusion;  Surgeon: Loura Halt Ditty, MD;  Location: [facility];  Service: Neurosurgery;  Laterality: N/A;  C6-7 Anterior cervical decompression/diskectomy/fusion  . DILITATION & CURRETTAGE/HYSTROSCOPY WITH NOVASURE ABLATION N/A 03/04/2018   Procedure: DILATATION & CURETTAGE/HYSTEROSCOPY WITH NOVASURE ABLATION;  Surgeon: Conan Bowens, MD;  Location: [facility];  Service: Gynecology;  Laterality: N/A;  . TUBAL LIGATION  2009     Current Outpatient Medications:  .  ARIPiprazole ER (ABILIFY MAINTENA) 400 MG PRSY prefilled syringe, Inject 400 mg into the muscle every 28 (twenty-eight) days., Disp: , Rfl:  .  acetaminophen (TYLENOL) 500 MG tablet, Take 1 tablet (500 mg total) by mouth every 6 (six) hours as needed., Disp: 30 tablet, Rfl: 0 .  metroNIDAZOLE (METROGEL VAGINAL) 0.75 % vaginal gel, Place 1 Applicatorful vaginally 2 (two) times daily. Use twice a month for 6 months - for recurrent BV. (Patient not taking: Reported on 02/18/2018), Disp: 70 g, Rfl: 12 .  oxyCODONE (ROXICODONE) 5 MG immediate release tablet, Take 1 tablet (5 mg total) by mouth every 4 (four) hours as  needed for severe pain., Disp: 30 tablet, Rfl: 0  The following portions of the patient's history were reviewed and updated as appropriate: allergies, current medications, past family history, past medical history, past social history, past surgical history and problem list.   Review of Systems:  Pertinent items noted in HPI and remainder of comprehensive ROS otherwise negative.   Objective:  Physical Exam BP 111/77   Pulse 74   Wt 221 lb (100.2 kg)   LMP 03/03/2018   BMI 33.60 kg/m  CONSTITUTIONAL: Well-developed, well-nourished female in no acute distress.  HENT:  Normocephalic, atraumatic. External right and left ear normal. Oropharynx is clear and moist EYES: Conjunctivae and EOM are normal. Pupils are equal, round, and reactive to light. No scleral icterus.  NECK: Normal range of motion, supple, no masses SKIN: Skin is warm and dry. No rash noted. Not diaphoretic. No erythema. No pallor. NEUROLOGIC: Alert and oriented to person, place, and time. Normal reflexes, muscle tone coordination. No cranial nerve deficit noted. PSYCHIATRIC: Normal mood and affect. Normal behavior. Normal judgment and thought content. CARDIOVASCULAR: Normal heart rate noted RESPIRATORY: Effort normal, no problems with respiration noted ABDOMEN: Soft, no distention noted.   PELVIC: Normal appearing external genitalia; normal appearing vaginal mucosa and cervix.  Small amount yellow discharge, strong odor noted.  pelvic cultures obtained.  MUSCULOSKELETAL: Normal range of motion. No edema noted.  Labs and Imaging No results found. Diagnosis Endometrium, curettage, with polyp - POLYPOID FRAGMENTS OF SECRETORY ENDOMETRIUM. - BENIGN ENDOCERVICAL MUCOSA. - NO HYPERPLASIA OR MALIGNANCY. Valinda Hoar MD Pathologist, Electronic Signature (Case  signed 03/05/2018)  Assessment & Plan:  1. Post-operative state Doing well  2. Abnormal uterine bleeding (AUB) S/p endometrial ablation  3. Vaginal  discharge Exam suggestive of BV Flagyl sent to pharmacy Encouraged her to use Metrogel as prescribed for suppressive medication To return with BV infection   Routine preventative health maintenance measures emphasized. Please refer to After Visit Summary for other counseling recommendations.   Return in about 1 year (around 03/28/2019), or if symptoms worsen or fail to improve.   Baldemar LenisK. Meryl Daiden Coltrane, M.D. [facility]

## 2018-03-28 LAB — CERVICOVAGINAL ANCILLARY ONLY
Bacterial vaginitis: POSITIVE — AB
Candida vaginitis: NEGATIVE

## 2018-03-29 MED ORDER — METRONIDAZOLE 500 MG PO TABS: 500.0000 mg | ORAL_TABLET | Freq: Two times a day (BID) | ORAL | 0 refills | Status: DC

## 2018-03-29 NOTE — Addendum Note (Signed)
Addended by: Leroy LibmanAVIS, Digby Groeneveld on: 03/29/2018 11:56 AM   Modules accepted: Orders

## 2018-04-19 ENCOUNTER — Emergency Department (HOSPITAL_COMMUNITY)
Admission: EM | Admit: 2018-04-19 | Discharge: 2018-04-19 | Disposition: A | Discharge status: Home / Self Care | Payer: Medicaid Other | Attending: Emergency Medicine | Admitting: Emergency Medicine

## 2018-04-19 ENCOUNTER — Encounter (HOSPITAL_COMMUNITY): Payer: Self-pay

## 2018-04-19 ENCOUNTER — Other Ambulatory Visit: Payer: Self-pay

## 2018-04-19 DIAGNOSIS — J329 Chronic sinusitis, unspecified: Secondary | ICD-10-CM | POA: Diagnosis not present

## 2018-04-19 DIAGNOSIS — Z79899 Other long term (current) drug therapy: Secondary | ICD-10-CM | POA: Diagnosis not present

## 2018-04-19 DIAGNOSIS — F17210 Nicotine dependence, cigarettes, uncomplicated: Secondary | ICD-10-CM | POA: Diagnosis not present

## 2018-04-19 DIAGNOSIS — B9689 Other specified bacterial agents as the cause of diseases classified elsewhere: Secondary | ICD-10-CM | POA: Diagnosis not present

## 2018-04-19 DIAGNOSIS — R51 Headache: Secondary | ICD-10-CM | POA: Diagnosis present

## 2018-04-19 DIAGNOSIS — J449 Chronic obstructive pulmonary disease, unspecified: Secondary | ICD-10-CM | POA: Insufficient documentation

## 2018-04-19 MED ORDER — ALBUTEROL SULFATE HFA 108 (90 BASE) MCG/ACT IN AERS
2.0000 | INHALATION_SPRAY | RESPIRATORY_TRACT | Status: DC | PRN
  Administered 2018-04-19: 2 via RESPIRATORY_TRACT
  Filled 2018-04-19: qty 6.7

## 2018-04-19 MED ORDER — AZITHROMYCIN 250 MG PO TABS: 250.0000 mg | ORAL_TABLET | Freq: Every day | ORAL | 0 refills | Status: DC

## 2018-04-19 MED ORDER — FLUTICASONE PROPIONATE 50 MCG/ACT NA SUSP: 2.0000 | Freq: Every day | NASAL | 0 refills | Status: DC

## 2018-04-19 MED ORDER — AEROCHAMBER PLUS FLO-VU LARGE MISC
1.0000 | Freq: Once | Status: AC
  Administered 2018-04-19: 1

## 2018-04-19 NOTE — ED Provider Notes (Signed)
[facility] Provider Note   CSN: [id number] Arrival date & time: 04/19/18  0224     History   Chief Complaint Chief Complaint  Patient presents with  . Nasal Congestion  . Cough    HPI Beverly Wong is a 37 y.o. female with a hx of bipolar disorder, schizophrenia presents to the Emergency Department complaining of gradual, persistent, progressively worsening URI symptoms onset 2 weeks ago.  Patient reports the symptoms improved significantly and then worsened over the last several days.  She reports associated nasal congestion, frontal headache and facial pain, postnasal drip, rhinorrhea, sore throat and cough.  She denies fevers or chills, chest pain, abdominal pain, nausea, vomiting, diarrhea, weakness, dizziness, syncope.  She reports taking over-the-counter cold medication without improvement.  Nothing makes her symptoms better or worse.  She denies known sick contacts.  She has been able to eat and drink normally.  The history is provided by the patient and medical records. No language interpreter was used.    Past Medical History:  Diagnosis Date  . Bipolar disorder (HCC)   . Headache   . Schizophrenia (HCC)   . Shortness of breath dyspnea    feels short of breath at times even when seated  . UTI (lower urinary tract infection)     Patient Active Problem List   Diagnosis Date Noted  . Abnormal uterine bleeding (AUB) 03/04/2018  . Sleep apnea 02/01/2016  . COPD exacerbation (HCC) 02/01/2016  . Opioid use disorder, mild, on maintenance therapy, abuse (HCC) 02/01/2016  . Snorings 02/01/2016  . Cervical radiculopathy 06/21/2015    Past Surgical History:  Procedure Laterality Date  . ANTERIOR CERVICAL DECOMP/DISCECTOMY FUSION N/A 06/21/2015   Procedure: Cervical six- seven Anterior Cervical Decompression/ Diskectomy/ Fusion;  Surgeon: Loura HaltBenjamin Jared Ditty, MD;  Location: [facility];  Service: Neurosurgery;  Laterality: N/A;  C6-7  Anterior cervical decompression/diskectomy/fusion  . DILITATION & CURRETTAGE/HYSTROSCOPY WITH NOVASURE ABLATION N/A 03/04/2018   Procedure: DILATATION & CURETTAGE/HYSTEROSCOPY WITH NOVASURE ABLATION;  Surgeon: Conan Bowensavis, Kelly M, MD;  Location: [facility];  Service: Gynecology;  Laterality: N/A;  . TUBAL LIGATION  2009     OB History    Gravida  3   Para  1   Term  1   Preterm      AB  2   Living  1     SAB  1   TAB  1   Ectopic      Multiple      Live Births  1            Home Medications    Prior to Admission medications   Medication Sig Start Date End Date Taking? Authorizing Provider  acetaminophen (TYLENOL) 500 MG tablet Take 1 tablet (500 mg total) by mouth every 6 (six) hours as needed. 03/04/18   Conan Bowensavis, Kelly M, MD  ARIPiprazole ER (ABILIFY MAINTENA) 400 MG PRSY prefilled syringe Inject 400 mg into the muscle every 28 (twenty-eight) days.    [provider]  azithromycin (ZITHROMAX) 250 MG tablet Take 1 tablet (250 mg total) by mouth daily. Take first 2 tablets together, then 1 every day until finished. 04/19/18   Benji Poynter, Dahlia ClientHannah, PA-C  fluticasone (FLONASE) 50 MCG/ACT nasal spray Place 2 sprays into both nostrils daily. 04/19/18   Cardarius Senat, Dahlia ClientHannah, PA-C  metroNIDAZOLE (FLAGYL) 500 MG tablet Take 1 tablet (500 mg total) by mouth 2 (two) times daily. 03/29/18   Conan Bowensavis, Kelly M, MD  metroNIDAZOLE (METROGEL  VAGINAL) 0.75 % vaginal gel Place 1 Applicatorful vaginally 2 (two) times daily. Use twice a month for 6 months - for recurrent BV. Patient not taking: Reported on 02/18/2018 10/03/17   Brock Bad, MD  oxyCODONE (ROXICODONE) 5 MG immediate release tablet Take 1 tablet (5 mg total) by mouth every 4 (four) hours as needed for severe pain. 03/04/18   Conan Bowens, MD    Family History Family History  Problem Relation Age of Onset  . Diabetes Mother   . Kidney cancer Father   . Breast cancer Maternal Aunt     Social History Social History     Tobacco Use  . Smoking status: Current Every Day Smoker    Packs/day: 1.00    Years: 17.00    Pack years: 17.00    Types: Cigarettes  . Smokeless tobacco: Never Used  Substance Use Topics  . Alcohol use: Yes    Comment: on ocaasion.   . Drug use: Yes    Types: Cocaine, Marijuana    Comment: uses cocaine occasionally, smokes marajuana QD.     Allergies   Ibuprofen   Review of Systems Review of Systems  Constitutional: Positive for fatigue. Negative for appetite change, chills and fever.  HENT: Positive for congestion, postnasal drip, rhinorrhea, sinus pressure and sore throat. Negative for ear discharge, ear pain and mouth sores.   Eyes: Negative for visual disturbance.  Respiratory: Positive for cough and chest tightness. Negative for shortness of breath, wheezing and stridor.   Cardiovascular: Negative for chest pain, palpitations and leg swelling.  Gastrointestinal: Negative for abdominal pain, diarrhea, nausea and vomiting.  Genitourinary: Negative for dysuria, frequency, hematuria and urgency.  Musculoskeletal: Negative for arthralgias, back pain, myalgias and neck stiffness.  Skin: Negative for rash.  Neurological: Positive for headaches. Negative for syncope, light-headedness and numbness.  Hematological: Negative for adenopathy.  Psychiatric/Behavioral: The patient is not nervous/anxious.   All other systems reviewed and are negative.    Physical Exam Updated Vital Signs BP 99/65   Pulse 92   Temp 98.4 F (36.9 C) (Oral)   Resp 19   SpO2 98%   Physical Exam  Constitutional: She appears well-developed and well-nourished. No distress.  HENT:  Head: Normocephalic and atraumatic.  Right Ear: Tympanic membrane, external ear and ear canal normal.  Left Ear: Tympanic membrane, external ear and ear canal normal.  Nose: Mucosal edema and rhinorrhea present. No epistaxis. Right sinus exhibits maxillary sinus tenderness. Right sinus exhibits no frontal sinus  tenderness. Left sinus exhibits maxillary sinus tenderness. Left sinus exhibits no frontal sinus tenderness.  Mouth/Throat: Uvula is midline and mucous membranes are normal. Mucous membranes are not pale and not cyanotic. No oropharyngeal exudate, posterior oropharyngeal edema, posterior oropharyngeal erythema or tonsillar abscesses.  Eyes: Pupils are equal, round, and reactive to light. Conjunctivae are normal.  Neck: Normal range of motion and full passive range of motion without pain.  Cardiovascular: Normal rate and intact distal pulses.  Pulmonary/Chest: Effort normal and breath sounds normal. No stridor.  Clear and equal breath sounds without focal wheezes, rhonchi, rales  Abdominal: Soft. There is no tenderness.  Musculoskeletal: Normal range of motion.  Lymphadenopathy:    She has no cervical adenopathy.  Neurological: She is alert.  Skin: Skin is warm and dry. No rash noted. She is not diaphoretic.  Psychiatric: She has a normal mood and affect.  Nursing note and vitals reviewed.    ED Treatments / Results   Procedures Procedures (including critical  care time)  Medications Ordered in ED Medications  albuterol (PROVENTIL HFA;VENTOLIN HFA) 108 (90 Base) MCG/ACT inhaler 2 puff (has no administration in time range)  AEROCHAMBER PLUS FLO-VU MEDIUM MISC 1 each (has no administration in time range)     Initial Impression / Assessment and Plan / ED Course  I have reviewed the triage vital signs and the nursing notes.  Pertinent labs & imaging results that were available during my care of the patient were reviewed by me and considered in my medical decision making (see chart for details).     Presents with URI symptoms.  Symptoms initially severe, improved and now have returned.  Patient with maxillary sinus tenderness and mild frontal headache.  Suspect early bacterial sinusitis.  No signs of meningitis.  Additionally patient is a smoker and reports chest tightness and wheezing  at home.  Lungs are clear here in the emergency room however we will give albuterol MDI for home usage for cough and wheezing.  Patient complaining of symptoms of sinusitis.     Final Clinical Impressions(s) / ED Diagnoses   Final diagnoses:  Bacterial sinusitis    ED Discharge Orders         Ordered    azithromycin (ZITHROMAX) 250 MG tablet  Daily     04/19/18 0335    fluticasone (FLONASE) 50 MCG/ACT nasal spray  Daily     04/19/18 0336           Bryla Burek, Dahlia Client, PA-C 04/19/18 1610    Cardama, Amadeo Garnet, MD 04/19/18 [phone]

## 2018-04-19 NOTE — ED Notes (Signed)
Patient verbalizes understanding of discharge instructions. Opportunity for questioning and answers were provided. Armband removed by staff, pt discharged from ED. Ambulated out into lobby  

## 2018-04-19 NOTE — ED Triage Notes (Signed)
Pt her for nasal congestion and a non productive cough for the last month.  Congestion got worse today.  Eating and drinking without difficulty.

## 2018-04-19 NOTE — Discharge Instructions (Addendum)
1. Medications: Flonase, Albuterol, Azithromycin, usual home medications 2. Treatment: rest, drink plenty of fluids, take tylenol or ibuprofen for fever control 3. Follow Up: Please followup with your primary doctor in 3 days for discussion of your diagnoses and further evaluation after today's visit; if you do not have a primary care doctor use the resource guide provided to find one; Return to the ER for high fevers, difficulty breathing or other concerning symptoms

## 2018-06-12 ENCOUNTER — Other Ambulatory Visit: Payer: Self-pay

## 2018-06-12 ENCOUNTER — Emergency Department (HOSPITAL_COMMUNITY)
Admission: EM | Admit: 2018-06-12 | Discharge: 2018-06-13 | Disposition: A | Discharge status: Home / Self Care | Payer: Medicaid Other | Attending: Emergency Medicine | Admitting: Emergency Medicine

## 2018-06-12 ENCOUNTER — Encounter (HOSPITAL_COMMUNITY): Payer: Self-pay | Admitting: Emergency Medicine

## 2018-06-12 DIAGNOSIS — R6889 Other general symptoms and signs: Secondary | ICD-10-CM

## 2018-06-12 DIAGNOSIS — F319 Bipolar disorder, unspecified: Secondary | ICD-10-CM | POA: Insufficient documentation

## 2018-06-12 DIAGNOSIS — R05 Cough: Secondary | ICD-10-CM | POA: Diagnosis present

## 2018-06-12 DIAGNOSIS — R6883 Chills (without fever): Secondary | ICD-10-CM | POA: Diagnosis not present

## 2018-06-12 DIAGNOSIS — F1110 Opioid abuse, uncomplicated: Secondary | ICD-10-CM | POA: Insufficient documentation

## 2018-06-12 DIAGNOSIS — Z79899 Other long term (current) drug therapy: Secondary | ICD-10-CM | POA: Diagnosis not present

## 2018-06-12 DIAGNOSIS — F17210 Nicotine dependence, cigarettes, uncomplicated: Secondary | ICD-10-CM | POA: Diagnosis not present

## 2018-06-12 DIAGNOSIS — R0981 Nasal congestion: Secondary | ICD-10-CM | POA: Diagnosis not present

## 2018-06-12 DIAGNOSIS — J3489 Other specified disorders of nose and nasal sinuses: Secondary | ICD-10-CM | POA: Insufficient documentation

## 2018-06-12 DIAGNOSIS — M7918 Myalgia, other site: Secondary | ICD-10-CM | POA: Insufficient documentation

## 2018-06-12 DIAGNOSIS — J029 Acute pharyngitis, unspecified: Secondary | ICD-10-CM | POA: Insufficient documentation

## 2018-06-12 DIAGNOSIS — F209 Schizophrenia, unspecified: Secondary | ICD-10-CM | POA: Diagnosis not present

## 2018-06-12 DIAGNOSIS — J449 Chronic obstructive pulmonary disease, unspecified: Secondary | ICD-10-CM | POA: Insufficient documentation

## 2018-06-12 NOTE — ED Triage Notes (Signed)
C/o non-productive cough, congestion, and generalized weakness/ body aches since yesterday.

## 2018-06-13 MED ORDER — BENZONATATE 100 MG PO CAPS: 100.0000 mg | ORAL_CAPSULE | Freq: Three times a day (TID) | ORAL | 0 refills | Status: DC

## 2018-06-13 MED ORDER — ONDANSETRON 4 MG PO TBDP: 4.0000 mg | ORAL_TABLET | Freq: Three times a day (TID) | ORAL | 0 refills | Status: DC | PRN

## 2018-06-13 MED ORDER — OSELTAMIVIR PHOSPHATE 75 MG PO CAPS: 75.0000 mg | ORAL_CAPSULE | Freq: Two times a day (BID) | ORAL | 0 refills | Status: DC

## 2018-06-13 NOTE — Discharge Instructions (Signed)
Take the prescribed medication as directed.  Make sure to rest and drink fluids.  Keep an eye on fever, can take tylenol or motrin for that and/or body aches. Follow-up with your primary care doctor. Return to the ED for new or worsening symptoms.

## 2018-06-13 NOTE — ED Provider Notes (Signed)
[facility]   CSN: [id number] Arrival date & time: 06/12/18  2041     History   Chief Complaint Chief Complaint  Patient presents with  . flu symptoms    HPI Beverly Wong is a 38 y.o. female.  The history is provided by the patient and medical records.     38 year old female with history of bipolar disorder, schizophrenia, presenting to the ED for flulike symptoms that began yesterday.  States she woke up feeling generally unwell.  She reports dry cough, sore throat, body aches, chills, and nausea.  States she was able to eat and drink a little bit today but denies any vomiting or diarrhea.  She has not had any chest pain or shortness of breath.  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  She did not receive a flu vaccine this year.  She has been taking over-the-counter TheraFlu without much relief.  Past Medical History:  Diagnosis Date  . Bipolar disorder (HCC)   . Headache   . Schizophrenia (HCC)   . Shortness of breath dyspnea    feels short of breath at times even when seated  . UTI (lower urinary tract infection)     Patient Active Problem List   Diagnosis Date Noted  . Abnormal uterine bleeding (AUB) 03/04/2018  . Sleep apnea 02/01/2016  . COPD exacerbation (HCC) 02/01/2016  . Opioid use disorder, mild, on maintenance therapy, abuse (HCC) 02/01/2016  . Snorings 02/01/2016  . Cervical radiculopathy 06/21/2015    Past Surgical History:  Procedure Laterality Date  . ANTERIOR CERVICAL DECOMP/DISCECTOMY FUSION N/A 06/21/2015   Procedure: Cervical six- seven Anterior Cervical Decompression/ Diskectomy/ Fusion;  Surgeon: Loura HaltBenjamin Jared Ditty, MD;  Location: [facility];  Service: Neurosurgery;  Laterality: N/A;  C6-7 Anterior cervical decompression/diskectomy/fusion  . DILITATION & CURRETTAGE/HYSTROSCOPY WITH NOVASURE ABLATION N/A 03/04/2018   Procedure: DILATATION &  CURETTAGE/HYSTEROSCOPY WITH NOVASURE ABLATION;  Surgeon: Conan Bowensavis, Kelly M, MD;  Location: [facility];  Service: Gynecology;  Laterality: N/A;  . TUBAL LIGATION  2009     OB History    Gravida  3   Para  1   Term  1   Preterm      AB  2   Living  1     SAB  1   TAB  1   Ectopic      Multiple      Live Births  1            Home Medications    Prior to Admission medications   Medication Sig Start Date End Date Taking? Authorizing Provider  acetaminophen (TYLENOL) 500 MG tablet Take 1 tablet (500 mg total) by mouth every 6 (six) hours as needed. 03/04/18   Conan Bowensavis, Kelly M, MD  ARIPiprazole ER (ABILIFY MAINTENA) 400 MG PRSY prefilled syringe Inject 400 mg into the muscle every 28 (twenty-eight) days.    [provider]  azithromycin (ZITHROMAX) 250 MG tablet Take 1 tablet (250 mg total) by mouth daily. Take first 2 tablets together, then 1 every day until finished. 04/19/18   Muthersbaugh, Dahlia ClientHannah, PA-C  fluticasone (FLONASE) 50 MCG/ACT nasal spray Place 2 sprays into both nostrils daily. 04/19/18   Muthersbaugh, Dahlia ClientHannah, PA-C  metroNIDAZOLE (FLAGYL) 500 MG tablet Take 1 tablet (500 mg total) by mouth 2 (two) times daily. 03/29/18   Conan Bowensavis, Kelly M, MD  metroNIDAZOLE (METROGEL VAGINAL) 0.75 % vaginal gel  Place 1 Applicatorful vaginally 2 (two) times daily. Use twice a month for 6 months - for recurrent BV. Patient not taking: Reported on 02/18/2018 10/03/17   Brock Bad, MD  oxyCODONE (ROXICODONE) 5 MG immediate release tablet Take 1 tablet (5 mg total) by mouth every 4 (four) hours as needed for severe pain. 03/04/18   Conan Bowens, MD    Family History Family History  Problem Relation Age of Onset  . Diabetes Mother   . Kidney cancer Father   . Breast cancer Maternal Aunt     Social History Social History   Tobacco Use  . Smoking status: Current Every Day Smoker    Packs/day: 1.00    Years: 17.00    Pack years: 17.00    Types: Cigarettes  . Smokeless  tobacco: Never Used  Substance Use Topics  . Alcohol use: Yes    Comment: on ocaasion.   . Drug use: Yes    Types: Cocaine, Marijuana    Comment: uses cocaine occasionally, smokes marajuana QD.     Allergies   Ibuprofen   Review of Systems Review of Systems  Constitutional: Positive for chills.  HENT: Positive for congestion, rhinorrhea and sore throat.   Respiratory: Positive for cough.   Musculoskeletal: Positive for myalgias.  All other systems reviewed and are negative.    Physical Exam Updated Vital Signs BP 104/66 (BP Location: Right Arm)   Pulse 87   Temp 98.8 F (37.1 C) (Oral)   Resp 16   SpO2 98%   Physical Exam Vitals signs and nursing note reviewed.  Constitutional:      Appearance: She is well-developed.  HENT:     Head: Normocephalic and atraumatic.     Right Ear: Tympanic membrane and ear canal normal.     Left Ear: Tympanic membrane and ear canal normal.     Nose: Congestion and rhinorrhea present. Rhinorrhea is clear.     Mouth/Throat:     Lips: Pink.     Mouth: Mucous membranes are moist.     Pharynx: Oropharynx is clear.     Comments: PND noted; tonsils overall normal in appearance bilaterally without exudate; uvula midline without evidence of peritonsillar abscess; handling secretions appropriately; no difficulty swallowing or speaking; normal phonation without stridor Eyes:     Conjunctiva/sclera: Conjunctivae normal.     Pupils: Pupils are equal, round, and reactive to light.  Neck:     Musculoskeletal: Normal range of motion.  Cardiovascular:     Rate and Rhythm: Normal rate and regular rhythm.     Heart sounds: Normal heart sounds.  Pulmonary:     Effort: Pulmonary effort is normal.     Breath sounds: Normal breath sounds.  Abdominal:     General: Bowel sounds are normal.     Palpations: Abdomen is soft.  Musculoskeletal: Normal range of motion.  Skin:    General: Skin is warm and dry.  Neurological:     Mental Status: She is  alert and oriented to person, place, and time.      ED Treatments / Results  Labs (all labs ordered are listed, but only abnormal results are displayed) Labs Reviewed - No data to display  EKG None  Radiology No results found.  Procedures Procedures (including critical care time)  Medications Ordered in ED Medications - No data to display   Initial Impression / Assessment and Plan / ED Course  I have reviewed the triage vital signs and the nursing notes.  Pertinent labs & imaging results that were available during my care of the patient were reviewed by me and considered in my medical decision making (see chart for details).  38 year old female here with flulike symptoms for the past 24 hours.  Woke up yesterday feeling poorly.  She is afebrile and nontoxic in appearance here.  Her exam was overall benign aside from some nasal congestion and clear rhinorrhea.  There is no tonsillar edema or exudates.  TMs clear bilaterally.  Lungs clear without any wheezes or rhonchi.  Feel this is likely a viral process, concerning for influenza.  Given that she is within 48-hour window, will initiate Tamiflu.  Encourage continued symptomatic care at home, pushing oral fluids and resting as much as possible.  Work note given.  Can follow-up with PCP.  Return here for any new or worsening symptoms.  Final Clinical Impressions(s) / ED Diagnoses   Final diagnoses:  Flu-like symptoms    ED Discharge Orders         Ordered    oseltamivir (TAMIFLU) 75 MG capsule  Every 12 hours     06/13/18 0204    benzonatate (TESSALON) 100 MG capsule  Every 8 hours     06/13/18 0204    ondansetron (ZOFRAN ODT) 4 MG disintegrating tablet  Every 8 hours PRN     06/13/18 0204           Garlon Hatchet, PA-C 06/13/18 0400    Ward, Layla Maw, DO 06/13/18 0522

## 2018-12-01 ENCOUNTER — Telehealth: Payer: Medicaid Other | Admitting: Nurse Practitioner

## 2018-12-01 DIAGNOSIS — Z20822 Contact with and (suspected) exposure to covid-19: Secondary | ICD-10-CM

## 2018-12-01 MED ORDER — BENZONATATE 100 MG PO CAPS: 100.0000 mg | ORAL_CAPSULE | Freq: Three times a day (TID) | ORAL | 0 refills | Status: DC

## 2018-12-01 NOTE — Addendum Note (Signed)
Addended by: Chevis Pretty on: 12/01/2018 11:57 AM   Modules accepted: Orders

## 2018-12-01 NOTE — Addendum Note (Signed)
Addended by: Chevis Pretty on: 12/01/2018 11:40 AM   Modules accepted: Orders

## 2018-12-01 NOTE — Progress Notes (Signed)
E-Visit for Corona Virus Screening   Your current symptoms could be consistent with the coronavirus.  Many health care providers can now test patients at their office but not all are.  [facility] has multiple testing sites. For information on our COVID testing locations and hours go to [email or web address]  Please quarantine yourself while awaiting your test results.    COVID-19 is a respiratory illness with symptoms that are similar to the flu. Symptoms are typically mild to moderate, but there have been cases of severe illness and death due to the virus. The following symptoms may appear 2-14 days after exposure: . Fever . Cough . Shortness of breath or difficulty breathing . Chills . Repeated shaking with chills . Muscle pain . Headache . Sore throat . New loss of taste or smell . Fatigue . Congestion or runny nose . Nausea or vomiting . Diarrhea  It is vitally important that if you feel that you have an infection such as this virus or any other virus that you stay home and away from places where you may spread it to others.  You should self-quarantine for 14 days if you have symptoms that could potentially be coronavirus or have been in close contact a with a person diagnosed with COVID-19 within the last 2 weeks. You should avoid contact with people age 65 and older.   You should wear a mask or cloth face covering over your nose and mouth if you must be around other people or animals, including pets (even at home). Try to stay at least 6 feet away from other people. This will protect the people around you.  You can use medication such as A prescription cough medication called Tessalon Perles 100 mg. You may take 1-2 capsules every 8 hours as needed for cough  You may also take acetaminophen (Tylenol) as needed for fever.   Reduce your risk of any infection by using the same precautions used for avoiding the common cold or flu:  . Wash your hands often  with soap and warm water for at least 20 seconds.  If soap and water are not readily available, use an alcohol-based hand sanitizer with at least 60% alcohol.  . If coughing or sneezing, cover your mouth and nose by coughing or sneezing into the elbow areas of your shirt or coat, into a tissue or into your sleeve (not your hands). . Avoid shaking hands with others and consider head nods or verbal greetings only. . Avoid touching your eyes, nose, or mouth with unwashed hands.  . Avoid close contact with people who are sick. . Avoid places or events with large numbers of people in one location, like concerts or sporting events. . Carefully consider travel plans you have or are making. . If you are planning any travel outside or inside the [place], visit the CDC's Travelers' Health webpage for the latest health notices. . If you have some symptoms but not all symptoms, continue to monitor at home and seek medical attention if your symptoms worsen. . If you are having a medical emergency, call 911.  HOME CARE . Only take medications as instructed by your medical team. . Drink plenty of fluids and get plenty of rest. . A steam or ultrasonic humidifier can help if you have congestion.   GET HELP RIGHT AWAY IF YOU HAVE EMERGENCY WARNING SIGNS** FOR COVID-19. If you or someone is showing any of these signs seek emergency medical care immediately. Call 911 or proceed to your closest   emergency facility if: . You develop worsening high fever. . Trouble breathing . Bluish lips or face . Persistent pain or pressure in the chest . New confusion . Inability to wake or stay awake . You cough up blood. . Your symptoms become more severe  **This list is not all possible symptoms. Contact your medical provider for any symptoms that are sever or concerning to you.   MAKE SURE YOU   Understand these instructions.  Will watch your condition.  Will get help right away if you are not doing well or get  worse.  Your e-visit answers were reviewed by a board certified advanced clinical practitioner to complete your personal care plan.  Depending on the condition, your plan could have included both over the counter or prescription medications.  If there is a problem please reply once you have received a response from your provider.  Your safety is important to us.  If you have drug allergies check your prescription carefully.    You can use MyChart to ask questions about today's visit, request a non-urgent call back, or ask for a work or school excuse for 24 hours related to this e-Visit. If it has been greater than 24 hours you will need to follow up with your provider, or enter a new e-Visit to address those concerns. You will get an e-mail in the next two days asking about your experience.  I hope that your e-visit has been valuable and will speed your recovery. Thank you for using e-visits.   5-10 minutes spent reviewing and documenting in chart.  

## 2018-12-01 NOTE — Progress Notes (Signed)
erroneous- repeat

## 2019-01-03 ENCOUNTER — Other Ambulatory Visit: Payer: Self-pay

## 2019-01-03 ENCOUNTER — Ambulatory Visit (HOSPITAL_COMMUNITY)
Admission: EM | Admit: 2019-01-03 | Discharge: 2019-01-03 | Disposition: A | Discharge status: Home / Self Care | Payer: Medicaid Other | Attending: Family Medicine | Admitting: Family Medicine

## 2019-01-03 ENCOUNTER — Encounter (HOSPITAL_COMMUNITY): Payer: Self-pay

## 2019-01-03 DIAGNOSIS — J069 Acute upper respiratory infection, unspecified: Secondary | ICD-10-CM | POA: Insufficient documentation

## 2019-01-03 DIAGNOSIS — J029 Acute pharyngitis, unspecified: Secondary | ICD-10-CM | POA: Insufficient documentation

## 2019-01-03 DIAGNOSIS — F17210 Nicotine dependence, cigarettes, uncomplicated: Secondary | ICD-10-CM | POA: Insufficient documentation

## 2019-01-03 DIAGNOSIS — Z20828 Contact with and (suspected) exposure to other viral communicable diseases: Secondary | ICD-10-CM | POA: Insufficient documentation

## 2019-01-03 DIAGNOSIS — Z79899 Other long term (current) drug therapy: Secondary | ICD-10-CM | POA: Diagnosis not present

## 2019-01-03 DIAGNOSIS — J441 Chronic obstructive pulmonary disease with (acute) exacerbation: Secondary | ICD-10-CM | POA: Diagnosis not present

## 2019-01-03 DIAGNOSIS — Z20822 Contact with and (suspected) exposure to covid-19: Secondary | ICD-10-CM

## 2019-01-03 LAB — POCT RAPID STREP A: Streptococcus, Group A Screen (Direct): NEGATIVE

## 2019-01-03 NOTE — Discharge Instructions (Addendum)
Take Tylenol for pain and fever Drink plenty of fluids Take over-the-counter cough and cold medicines if needed You must stay at home and quarantine until your test results are available The results will be available in my chart in a couple of days     Person Under Monitoring Name: Beverly Wong  Location: [address] [place] [id number]   Infection Prevention Recommendations for Individuals Confirmed to have, or Being Evaluated for, 2019 Novel Coronavirus (COVID-19) Infection Who Receive Care at Home  Individuals who are confirmed to have, or are being evaluated for, COVID-19 should follow the prevention steps below until a healthcare provider or local or state health department says they can return to normal activities.  Stay home except to get medical care You should restrict activities outside your home, except for getting medical care. Do not go to work, school, or public areas, and do not use public transportation or taxis.  Call ahead before visiting your doctor Before your medical appointment, call the healthcare provider and tell them that you have, or are being evaluated for, COVID-19 infection. This will help the healthcare providers office take steps to keep other people from getting infected. Ask your healthcare provider to call the local or state health department.  Monitor your symptoms Seek prompt medical attention if your illness is worsening (e.g., difficulty breathing). Before going to your medical appointment, call the healthcare provider and tell them that you have, or are being evaluated for, COVID-19 infection. Ask your healthcare provider to call the local or state health department.  Wear a facemask You should wear a facemask that covers your nose and mouth when you are in the same room with other people and when you visit a healthcare provider. People who live with or visit you should also wear a facemask while they are in the same room  with you.  Separate yourself from other people in your home As much as possible, you should stay in a different room from other people in your home. Also, you should use a separate bathroom, if available.  Avoid sharing household items You should not share dishes, drinking glasses, cups, eating utensils, towels, bedding, or other items with other people in your home. After using these items, you should wash them thoroughly with soap and water.  Cover your coughs and sneezes Cover your mouth and nose with a tissue when you cough or sneeze, or you can cough or sneeze into your sleeve. Throw used tissues in a lined trash can, and immediately wash your hands with soap and water for at least 20 seconds or use an alcohol-based hand rub.  Wash your [employer] Wash your hands often and thoroughly with soap and water for at least 20 seconds. You can use an alcohol-based hand sanitizer if soap and water are not available and if your hands are not visibly dirty. Avoid touching your eyes, nose, and mouth with unwashed hands.   Prevention Steps for Caregivers and Household Members of Individuals Confirmed to have, or Being Evaluated for, COVID-19 Infection Being Cared for in the Home  If you live with, or provide care at home for, a person confirmed to have, or being evaluated for, COVID-19 infection please follow these guidelines to prevent infection:  Follow healthcare providers instructions Make sure that you understand and can help the patient follow any healthcare provider instructions for all care.  Provide for the patients basic needs You should help the patient with basic needs in the home  and provide support for getting groceries, prescriptions, and other personal needs.  Monitor the patients symptoms If they are getting sicker, call his or her medical provider and tell them that the patient has, or is being evaluated for, COVID-19 infection. This will help the healthcare providers  office take steps to keep other people from getting infected. Ask the healthcare provider to call the local or state health department.  Limit the number of people who have contact with the patient If possible, have only one caregiver for the patient. Other household members should stay in another home or place of residence. If this is not possible, they should stay in another room, or be separated from the patient as much as possible. Use a separate bathroom, if available. Restrict visitors who do not have an essential need to be in the home.  Keep older adults, very young children, and other sick people away from the patient Keep older adults, very young children, and those who have compromised immune systems or chronic health conditions away from the patient. This includes people with chronic heart, lung, or kidney conditions, diabetes, and cancer.  Ensure good ventilation Make sure that shared spaces in the home have good air flow, such as from an air conditioner or an opened window, weather permitting.  Wash your hands often Wash your hands often and thoroughly with soap and water for at least 20 seconds. You can use an alcohol based hand sanitizer if soap and water are not available and if your hands are not visibly dirty. Avoid touching your eyes, nose, and mouth with unwashed hands. Use disposable paper towels to dry your hands. If not available, use dedicated cloth towels and replace them when they become wet.  Wear a facemask and gloves Wear a disposable facemask at all times in the room and gloves when you touch or have contact with the patients blood, body fluids, and/or secretions or excretions, such as sweat, saliva, sputum, nasal mucus, vomit, urine, or feces.  Ensure the mask fits over your nose and mouth tightly, and do not touch it during use. Throw out disposable facemasks and gloves after using them. Do not reuse. Wash your hands immediately after removing your facemask  and gloves. If your personal clothing becomes contaminated, carefully remove clothing and launder. Wash your hands after handling contaminated clothing. Place all used disposable facemasks, gloves, and other waste in a lined container before disposing them with other household waste. Remove gloves and wash your hands immediately after handling these items.  Do not share dishes, glasses, or other household items with the patient Avoid sharing household items. You should not share dishes, drinking glasses, cups, eating utensils, towels, bedding, or other items with a patient who is confirmed to have, or being evaluated for, COVID-19 infection. After the person uses these items, you should wash them thoroughly with soap and water.  Wash laundry thoroughly Immediately remove and wash clothes or bedding that have blood, body fluids, and/or secretions or excretions, such as sweat, saliva, sputum, nasal mucus, vomit, urine, or feces, on them. Wear gloves when handling laundry from the patient. Read and follow directions on labels of laundry or clothing items and detergent. In general, wash and dry with the warmest temperatures recommended on the label.  Clean all areas the individual has used often Clean all touchable surfaces, such as counters, tabletops, doorknobs, bathroom fixtures, toilets, phones, keyboards, tablets, and bedside tables, every day. Also, clean any surfaces that may have blood, body fluids, and/or secretions  or excretions on them. Wear gloves when cleaning surfaces the patient has come in contact with. Use a diluted bleach solution (e.g., dilute bleach with 1 part bleach and 10 parts water) or a household disinfectant with a label that says EPA-registered for coronaviruses. To make a bleach solution at home, add 1 tablespoon of bleach to 1 quart (4 cups) of water. For a larger supply, add  cup of bleach to 1 gallon (16 cups) of water. Read labels of cleaning products and follow  recommendations provided on product labels. Labels contain instructions for safe and effective use of the cleaning product including precautions you should take when applying the product, such as wearing gloves or eye protection and making sure you have good ventilation during use of the product. Remove gloves and wash hands immediately after cleaning.  Monitor yourself for signs and symptoms of illness Caregivers and household members are considered close contacts, should monitor their health, and will be asked to limit movement outside of the home to the extent possible. Follow the monitoring steps for close contacts listed on the symptom monitoring form.   ? If you have additional questions, contact your local health department or call the epidemiologist on call at [phone] (available 24/7). ? This guidance is subject to change. For the most up-to-date guidance from [facility], please refer to their website: [email or web address]

## 2019-01-03 NOTE — ED Triage Notes (Signed)
Patient presents to Urgent Care with complaints of sore throat, cough, and nasal congestion since 4 days ago. Patient reports everyone in her house is sick, COVID tests pending for others.

## 2019-01-03 NOTE — ED Provider Notes (Signed)
[facility]    CSN: [id number] Arrival date & time: 01/03/19  1056      History   Chief Complaint Chief Complaint  Patient presents with  . Sore Throat  . Cough    HPI Beverly Wong is a 38 y.o. female.   HPI  Cough and sore throat, nasal congestion. Headache for 4 d.  Other family members in the house are sick also.  Son had strep and COVID testing, results not in.  No body aches or fatigue.  No sweats, chills or fever.No known exposure to [place] Disabled from working   Past Medical History:  Diagnosis Date  . Bipolar disorder ([place])   . Headache   . Schizophrenia ([place])   . Shortness of breath dyspnea    feels short of breath at times even when seated  . UTI (lower urinary tract infection)     Patient Active Problem List   Diagnosis Date Noted  . Abnormal uterine bleeding (AUB) 03/04/2018  . Sleep apnea 02/01/2016  . COPD exacerbation ([place]) 02/01/2016  . 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 Snorings 02/01/2016  . Cervical radiculopathy 06/21/2015    Past Surgical History:  Procedure Laterality Date  . ANTERIOR CERVICAL DECOMP/DISCECTOMY FUSION N/A 06/21/2015   Procedure: Cervical six- seven Anterior Cervical Decompression/ Diskectomy/ Fusion;  Surgeon: Kevan Ny Ditty, MD;  Location: [facility];  Service: Neurosurgery;  Laterality: N/A;  C6-7 Anterior cervical decompression/diskectomy/fusion  . DILITATION & CURRETTAGE/HYSTROSCOPY WITH NOVASURE ABLATION N/A 03/04/2018   Procedure: DILATATION & CURETTAGE/HYSTEROSCOPY WITH NOVASURE ABLATION;  Surgeon: Sloan Leiter, MD;  Location: [facility];  Service: Gynecology;  Laterality: N/A;  . TUBAL LIGATION  2009    OB History    Gravida  3   Para  1   Term  1   Preterm      AB  2   Living  1     SAB  1   TAB  1   Ectopic      Multiple      Live Births  1            Home Medications    Prior to Admission medications   Medication Sig Start  Date End Date Taking? Authorizing Provider  acetaminophen (TYLENOL) 500 MG tablet Take 1 tablet (500 mg total) by mouth every 6 (six) hours as needed. 03/04/18   Sloan Leiter, MD  ARIPiprazole ER (ABILIFY MAINTENA) 400 MG PRSY prefilled syringe Inject 400 mg into the muscle every 28 (twenty-eight) days.    [provider]  fluticasone (FLONASE) 50 MCG/ACT nasal spray Place 2 sprays into both nostrils daily. 04/19/18   Muthersbaugh, Jarrett Soho, PA-C  ondansetron (ZOFRAN ODT) 4 MG disintegrating tablet Take 1 tablet (4 mg total) by mouth every 8 (eight) hours as needed for nausea. 06/13/18   Larene Pickett, PA-C    Family History Family History  Problem Relation Age of Onset  . Diabetes Mother   . Kidney cancer Father   . Breast cancer Maternal Aunt     Social History Social History   Tobacco Use  . Smoking status: Current Every Day Smoker    Packs/day: 1.00    Years: 17.00    Pack years: 17.00    Types: Cigarettes  . Smokeless tobacco: Never Used  Substance Use Topics  . Alcohol use: Yes    Comment: on ocaasion.   . Drug use: Yes    Types: Cocaine,  Marijuana    Comment: uses cocaine occasionally, smokes marajuana QD.     Allergies   Ibuprofen   Review of Systems Review of Systems  Constitutional: Negative for chills, diaphoresis, fatigue and fever.  HENT: Positive for congestion, rhinorrhea and sore throat. Negative for ear pain.   Eyes: Negative for pain and visual disturbance.  Respiratory: Positive for cough. Negative for shortness of breath.   Cardiovascular: Negative for chest pain and palpitations.  Gastrointestinal: Negative for abdominal pain and vomiting.  Genitourinary: Negative for dysuria and hematuria.  Musculoskeletal: Negative for arthralgias and back pain.  Skin: Negative for color change and rash.  Neurological: Positive for headaches. Negative for seizures and syncope.  All other systems reviewed and are negative.    Physical Exam Triage  Vital Signs ED Triage Vitals  Enc Vitals Group     BP 01/03/19 1115 138/78     Pulse Rate 01/03/19 1115 67     Resp 01/03/19 1115 17     Temp 01/03/19 1115 98.5 F (36.9 C)     Temp Source 01/03/19 1115 Oral     SpO2 01/03/19 1115 98 %     Weight --      Height --      Head Circumference --      Peak Flow --      Pain Score 01/03/19 1112 5     Pain Loc --      Pain Edu? --      Excl. in GC? --    No data found.  Updated Vital Signs BP 138/78 (BP Location: Right Arm)   Pulse 67   Temp 98.5 F (36.9 C) (Oral)   Resp 17   SpO2 98%     Physical Exam Constitutional:      General: She is not in acute distress.    Appearance: She is well-developed. She is obese.  HENT:     Head: Normocephalic and atraumatic.     Right Ear: Tympanic membrane and ear canal normal.     Left Ear: Tympanic membrane and ear canal normal.     Nose: Congestion present.     Mouth/Throat:     Pharynx: Uvula midline. Posterior oropharyngeal erythema present.     Tonsils: No tonsillar exudate. 1+ on the right. 1+ on the left.  Eyes:     Conjunctiva/sclera: Conjunctivae normal.     Pupils: Pupils are equal, round, and reactive to light.  Neck:     Musculoskeletal: Normal range of motion.  Cardiovascular:     Rate and Rhythm: Normal rate and regular rhythm.     Heart sounds: Normal heart sounds.  Pulmonary:     Effort: Pulmonary effort is normal. No respiratory distress.     Breath sounds: Normal breath sounds.  Abdominal:     General: Bowel sounds are normal. There is no distension.     Palpations: Abdomen is soft.  Musculoskeletal: Normal range of motion.  Lymphadenopathy:     Cervical: No cervical adenopathy.  Skin:    General: Skin is warm and dry.  Neurological:     Mental Status: She is alert.  Psychiatric:        Mood and Affect: Mood normal.        Behavior: Behavior normal.      UC Treatments / Results  Labs (all labs ordered are listed, but only abnormal results are  displayed) Labs Reviewed  NOVEL CORONAVIRUS, NAA (HOSPITAL ORDER, SEND-OUT TO REF LAB)  CULTURE, GROUP A  STREP [facility])  POCT RAPID STREP A    EKG   Radiology No results found.  Procedures Procedures (including critical care time)  Medications Ordered in UC Medications - No data to display  Initial Impression / Assessment and Plan / UC Course  I have reviewed the triage vital signs and the nursing notes.  Pertinent labs & imaging results that were available during my care of the patient were reviewed by me and considered in my medical decision making (see chart for details).     Reviewed the importance of quarantine pending coronavirus results Final Clinical Impressions(s) / UC Diagnoses   Final diagnoses:  Sore throat  Viral upper respiratory tract infection  Suspected Covid-19 Virus Infection     Discharge Instructions     Take Tylenol for pain and fever Drink plenty of fluids Take over-the-counter cough and cold medicines if needed You must stay at home and quarantine until your test results are available The results will be available in my chart in a couple of days     Person Under Monitoring Name: Rashida Ladouceur  Location: [address] [place] [address]   Infection Prevention Recommendations for Individuals Confirmed to have, or Being Evaluated for, 2019 Novel Coronavirus (COVID-19) Infection Who Receive Care at Home  Individuals who are confirmed to have, or are being evaluated for, COVID-19 should follow the prevention steps below until a healthcare provider or local or state health department says they can return to normal activities.  Stay home except to get medical care You should restrict activities outside your home, except for getting medical care. Do not go to work, school, or public areas, and do not use public transportation or taxis.  Call ahead before visiting your doctor Before your medical appointment, call the  healthcare provider and tell them that you have, or are being evaluated for, COVID-19 infection. This will help the healthcare provider's office take steps to keep other people from getting infected. Ask your healthcare provider to call the local or state health department.  Monitor your symptoms Seek prompt medical attention if your illness is worsening (e.g., difficulty breathing). Before going to your medical appointment, call the healthcare provider and tell them that you have, or are being evaluated for, COVID-19 infection. Ask your healthcare provider to call the local or state health department.  Wear a facemask You should wear a facemask that covers your nose and mouth when you are in the same room with other people and when you visit a healthcare provider. People who live with or visit you should also wear a facemask while they are in the same room with you.  Separate yourself from other people in your home As much as possible, you should stay in a different room from other people in your home. Also, you should use a separate bathroom, if available.  Avoid sharing household items You should not share dishes, drinking glasses, cups, eating utensils, towels, bedding, or other items with other people in your home. After using these items, you should wash them thoroughly with soap and water.  Cover your coughs and sneezes Cover your mouth and nose with a tissue when you cough or sneeze, or you can cough or sneeze into your sleeve. Throw used tissues in a lined trash can, and immediately wash your hands with soap and water for at least 20 seconds or use an alcohol-based hand rub.  Wash your [employer] your hands often and thoroughly with soap and water  for at least 20 seconds. You can use an alcohol-based hand sanitizer if soap and water are not available and if your hands are not visibly dirty. Avoid touching your eyes, nose, and mouth with unwashed hands.   Prevention Steps for  Caregivers and Household Members of Individuals Confirmed to have, or Being Evaluated for, COVID-19 Infection Being Cared for in the Home  If you live with, or provide care at home for, a person confirmed to have, or being evaluated for, COVID-19 infection please follow these guidelines to prevent infection:  Follow healthcare provider's instructions Make sure that you understand and can help the patient follow any healthcare provider instructions for all care.  Provide for the patient's basic needs You should help the patient with basic needs in the home and provide support for getting groceries, prescriptions, and other personal needs.  Monitor the patient's symptoms If they are getting sicker, call his or her medical provider and tell them that the patient has, or is being evaluated for, COVID-19 infection. This will help the healthcare provider's office take steps to keep other people from getting infected. Ask the healthcare provider to call the local or state health department.  Limit the number of people who have contact with the patient  If possible, have only one caregiver for the patient.  Other household members should stay in another home or place of residence. If this is not possible, they should stay  in another room, or be separated from the patient as much as possible. Use a separate bathroom, if available.  Restrict visitors who do not have an essential need to be in the home.  Keep older adults, very young children, and other sick people away from the patient Keep older adults, very young children, and those who have compromised immune systems or chronic health conditions away from the patient. This includes people with chronic heart, lung, or kidney conditions, diabetes, and cancer.  Ensure good ventilation Make sure that shared spaces in the home have good air flow, such as from an air conditioner or an opened window, weather permitting.  Wash your hands often   Wash your hands often and thoroughly with soap and water for at least 20 seconds. You can use an alcohol based hand sanitizer if soap and water are not available and if your hands are not visibly dirty.  Avoid touching your eyes, nose, and mouth with unwashed hands.  Use disposable paper towels to dry your hands. If not available, use dedicated cloth towels and replace them when they become wet.  Wear a facemask and gloves  Wear a disposable facemask at all times in the room and gloves when you touch or have contact with the patient's blood, body fluids, and/or secretions or excretions, such as sweat, saliva, sputum, nasal mucus, vomit, urine, or feces.  Ensure the mask fits over your nose and mouth tightly, and do not touch it during use.  Throw out disposable facemasks and gloves after using them. Do not reuse.  Wash your hands immediately after removing your facemask and gloves.  If your personal clothing becomes contaminated, carefully remove clothing and launder. Wash your hands after handling contaminated clothing.  Place all used disposable facemasks, gloves, and other waste in a lined container before disposing them with other household waste.  Remove gloves and wash your hands immediately after handling these items.  Do not share dishes, glasses, or other household items with the patient  Avoid sharing household items. You should not share dishes,  drinking glasses, cups, eating utensils, towels, bedding, or other items with a patient who is confirmed to have, or being evaluated for, COVID-19 infection.  After the person uses these items, you should wash them thoroughly with soap and water.  Wash laundry thoroughly  Immediately remove and wash clothes or bedding that have blood, body fluids, and/or secretions or excretions, such as sweat, saliva, sputum, nasal mucus, vomit, urine, or feces, on them.  Wear gloves when handling laundry from the patient.  Read and follow  directions on labels of laundry or clothing items and detergent. In general, wash and dry with the warmest temperatures recommended on the label.  Clean all areas the individual has used often  Clean all touchable surfaces, such as counters, tabletops, doorknobs, bathroom fixtures, toilets, phones, keyboards, tablets, and bedside tables, every day. Also, clean any surfaces that may have blood, body fluids, and/or secretions or excretions on them.  Wear gloves when cleaning surfaces the patient has come in contact with.  Use a diluted bleach solution (e.g., dilute bleach with 1 part bleach and 10 parts water) or a household disinfectant with a label that says EPA-registered for coronaviruses. To make a bleach solution at home, add 1 tablespoon of bleach to 1 quart (4 cups) of water. For a larger supply, add  cup of bleach to 1 gallon (16 cups) of water.  Read labels of cleaning products and follow recommendations provided on product labels. Labels contain instructions for safe and effective use of the cleaning product including precautions you should take when applying the product, such as wearing gloves or eye protection and making sure you have good ventilation during use of the product.  Remove gloves and wash hands immediately after cleaning.  Monitor yourself for signs and symptoms of illness Caregivers and household members are considered close contacts, should monitor their health, and will be asked to limit movement outside of the home to the extent possible. Follow the monitoring steps for close contacts listed on the symptom monitoring form.   ? If you have additional questions, contact your local health department or call the epidemiologist on call at [phone] (available 24/7). ? This guidance is subject to change. For the most up-to-date guidance from [facility], please refer to their website: TripMetro.huhttps://www.cdc.gov/coronavirus/2019-ncov/hcp/guidance-prevent-spread.html    ED  Prescriptions    None     Controlled Substance Prescriptions [place] Controlled Substance Registry consulted? Not Applicable   Eustace MooreNelson, [place] Staffa Sue, MD 01/03/19 1250

## 2019-01-05 LAB — NOVEL CORONAVIRUS, NAA (HOSP ORDER, SEND-OUT TO REF LAB; TAT 18-24 HRS): SARS-CoV-2, NAA: NOT DETECTED

## 2019-01-06 ENCOUNTER — Encounter (HOSPITAL_COMMUNITY): Payer: Self-pay

## 2019-01-06 LAB — CULTURE, GROUP A STREP (THRC)

## 2019-01-26 ENCOUNTER — Other Ambulatory Visit: Payer: Self-pay

## 2019-01-26 ENCOUNTER — Ambulatory Visit (HOSPITAL_COMMUNITY)
Admission: EM | Admit: 2019-01-26 | Discharge: 2019-01-26 | Disposition: A | Discharge status: Home / Self Care | Payer: Medicaid Other | Attending: Family Medicine | Admitting: Family Medicine

## 2019-01-26 ENCOUNTER — Encounter (HOSPITAL_COMMUNITY): Payer: Self-pay

## 2019-01-26 DIAGNOSIS — L304 Erythema intertrigo: Secondary | ICD-10-CM | POA: Diagnosis not present

## 2019-01-26 DIAGNOSIS — H6502 Acute serous otitis media, left ear: Secondary | ICD-10-CM | POA: Diagnosis not present

## 2019-01-26 MED ORDER — NYSTATIN 100000 UNIT/GM EX CREA: TOPICAL_CREAM | CUTANEOUS | 0 refills | Status: DC

## 2019-01-26 MED ORDER — AMOXICILLIN 500 MG PO CAPS: 500.0000 mg | ORAL_CAPSULE | Freq: Three times a day (TID) | ORAL | 0 refills | Status: DC

## 2019-01-26 NOTE — ED Provider Notes (Signed)
[facility]    CSN: [id number] Arrival date & time: 01/26/19  1428      History   Chief Complaint Chief Complaint  Patient presents with  . Rash  . Otalgia    HPI Beverly Wong is a 38 y.o. female.   Complains of some rash between the breasts.  She has been using an antifungal cream without success.  Also complains of pain in her left ear.  The ear feels stopped up as well hurting.  HPI  Past Medical History:  Diagnosis Date  . Bipolar disorder ([place])   . Headache   . Schizophrenia ([place])   . Shortness of breath dyspnea    feels short of breath at times even when seated  . UTI (lower urinary tract infection)     Patient Active Problem List   Diagnosis Date Noted  . Abnormal uterine bleeding (AUB) 03/04/2018  . Sleep apnea 02/01/2016  . COPD exacerbation ([place]) 02/01/2016  . 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 Snorings 02/01/2016  . Cervical radiculopathy 06/21/2015    Past Surgical History:  Procedure Laterality Date  . ANTERIOR CERVICAL DECOMP/DISCECTOMY FUSION N/A 06/21/2015   Procedure: Cervical six- seven Anterior Cervical Decompression/ Diskectomy/ Fusion;  Surgeon: Kevan Ny Ditty, MD;  Location: [facility];  Service: Neurosurgery;  Laterality: N/A;  C6-7 Anterior cervical decompression/diskectomy/fusion  . DILITATION & CURRETTAGE/HYSTROSCOPY WITH NOVASURE ABLATION N/A 03/04/2018   Procedure: DILATATION & CURETTAGE/HYSTEROSCOPY WITH NOVASURE ABLATION;  Surgeon: Sloan Leiter, MD;  Location: [facility];  Service: Gynecology;  Laterality: N/A;  . TUBAL LIGATION  2009    OB History    Gravida  3   Para  1   Term  1   Preterm      AB  2   Living  1     SAB  1   TAB  1   Ectopic      Multiple      Live Births  1            Home Medications    Prior to Admission medications   Medication Sig Start Date End Date Taking? Authorizing Provider  acetaminophen (TYLENOL) 500 MG tablet  Take 1 tablet (500 mg total) by mouth every 6 (six) hours as needed. 03/04/18   Sloan Leiter, MD  ARIPiprazole ER (ABILIFY MAINTENA) 400 MG PRSY prefilled syringe Inject 400 mg into the muscle every 28 (twenty-eight) days.    [provider]  fluticasone (FLONASE) 50 MCG/ACT nasal spray Place 2 sprays into both nostrils daily. 04/19/18   Muthersbaugh, Jarrett Soho, PA-C  ondansetron (ZOFRAN ODT) 4 MG disintegrating tablet Take 1 tablet (4 mg total) by mouth every 8 (eight) hours as needed for nausea. 06/13/18   Larene Pickett, PA-C    Family History Family History  Problem Relation Age of Onset  . Diabetes Mother   . Kidney cancer Father   . Breast cancer Maternal Aunt     Social History Social History   Tobacco Use  . Smoking status: Current Every Day Smoker    Packs/day: 1.00    Years: 17.00    Pack years: 17.00    Types: Cigarettes  . Smokeless tobacco: Never Used  Substance Use Topics  . Alcohol use: Yes    Comment: on ocaasion.   . Drug use: Yes    Types: Cocaine, Marijuana    Comment: uses cocaine occasionally, smokes marajuana QD.     Allergies  Ibuprofen   Review of Systems Review of Systems  HENT: Positive for ear pain.   Skin: Positive for rash.  All other systems reviewed and are negative.    Physical Exam Triage Vital Signs ED Triage Vitals  Enc Vitals Group     BP 01/26/19 1457 98/65     Pulse Rate 01/26/19 1457 77     Resp 01/26/19 1457 15     Temp 01/26/19 1457 98.4 F (36.9 C)     Temp Source 01/26/19 1457 Oral     SpO2 01/26/19 1457 98 %     Weight --      Height --      Head Circumference --      Peak Flow --      Pain Score 01/26/19 1456 2     Pain Loc --      Pain Edu? --      Excl. in GC? --    No data found.  Updated Vital Signs BP 98/65 (BP Location: Right Arm)   Pulse 77   Temp 98.4 F (36.9 C) (Oral)   Resp 15   SpO2 98%   Visual Acuity Right Eye Distance:   Left Eye Distance:   Bilateral Distance:    Right  Eye Near:   Left Eye Near:    Bilateral Near:     Physical Exam Constitutional:      Appearance: Normal appearance.  HENT:     Head:     Comments: Left tympanic membrane is dull without normal light reflex.  There is some erythema but it is not fiery red.    Right Ear: Tympanic membrane normal.  Skin:    Comments: Skin is discolored between breast consistent with intertrigo and possible monilia infection  Neurological:     Mental Status: She is alert.      UC Treatments / Results  Labs (all labs ordered are listed, but only abnormal results are displayed) Labs Reviewed - No data to display  EKG   Radiology No results found.  Procedures Procedures (including critical care time)  Medications Ordered in UC Medications - No data to display  Initial Impression / Assessment and Plan / UC Course  I have reviewed the triage vital signs and the nursing notes.  Pertinent labs & imaging results that were available during my care of the patient were reviewed by me and considered in my medical decision making (see chart for details).     Intertrigo secondary to probable yeast infection Left serous otitis with possible early infection Final Clinical Impressions(s) / UC Diagnoses   Final diagnoses:  None   Discharge Instructions   None    ED Prescriptions    None     Controlled Substance Prescriptions [place] Controlled Substance Registry consulted? No   Frederica KusterMiller, Stephen M, MD 01/26/19 1525

## 2019-01-26 NOTE — ED Triage Notes (Signed)
Patient presents to Urgent Care with complaints of rash between breasts and left ear pain since about a week ago. Patient reports she has been using an antifungal cream on the rash and it is less itchy but looks worse.

## 2019-02-06 ENCOUNTER — Other Ambulatory Visit: Payer: Self-pay | Admitting: Obstetrics

## 2019-02-06 DIAGNOSIS — B3731 Acute candidiasis of vulva and vagina: Secondary | ICD-10-CM

## 2019-02-06 DIAGNOSIS — B373 Candidiasis of vulva and vagina: Secondary | ICD-10-CM

## 2019-04-14 ENCOUNTER — Encounter (HOSPITAL_COMMUNITY): Payer: Self-pay

## 2019-04-14 ENCOUNTER — Other Ambulatory Visit: Payer: Self-pay

## 2019-04-14 ENCOUNTER — Ambulatory Visit (HOSPITAL_COMMUNITY)
Admission: EM | Admit: 2019-04-14 | Discharge: 2019-04-14 | Disposition: A | Discharge status: Home / Self Care | Payer: Medicaid Other | Attending: Family Medicine | Admitting: Family Medicine

## 2019-04-14 DIAGNOSIS — K602 Anal fissure, unspecified: Secondary | ICD-10-CM

## 2019-04-14 DIAGNOSIS — K5900 Constipation, unspecified: Secondary | ICD-10-CM | POA: Diagnosis not present

## 2019-04-14 DIAGNOSIS — Q386 Other congenital malformations of mouth: Secondary | ICD-10-CM

## 2019-04-14 DIAGNOSIS — H66002 Acute suppurative otitis media without spontaneous rupture of ear drum, left ear: Secondary | ICD-10-CM | POA: Diagnosis not present

## 2019-04-14 MED ORDER — FLUCONAZOLE 150 MG PO TABS: 150.0000 mg | ORAL_TABLET | Freq: Once | ORAL | 0 refills | Status: AC

## 2019-04-14 MED ORDER — AMOXICILLIN-POT CLAVULANATE 875-125 MG PO TABS: 1.0000 | ORAL_TABLET | Freq: Two times a day (BID) | ORAL | 0 refills | Status: DC

## 2019-04-14 MED ORDER — ALBUTEROL SULFATE HFA 108 (90 BASE) MCG/ACT IN AERS: 2.0000 | INHALATION_SPRAY | RESPIRATORY_TRACT | 11 refills | Status: DC | PRN

## 2019-04-14 MED ORDER — TRIAMCINOLONE ACETONIDE 0.1 % EX CREA: 1.0000 "application " | TOPICAL_CREAM | Freq: Two times a day (BID) | CUTANEOUS | 0 refills | Status: DC

## 2019-04-14 MED ORDER — HYDROCORTISONE (PERIANAL) 2.5 % EX CREA: 1.0000 "application " | TOPICAL_CREAM | Freq: Two times a day (BID) | CUTANEOUS | 0 refills | Status: DC

## 2019-04-14 MED ORDER — FLUTICASONE PROPIONATE 50 MCG/ACT NA SUSP: 2.0000 | Freq: Every day | NASAL | 11 refills | Status: DC

## 2019-04-14 MED ORDER — POLYETHYLENE GLYCOL 3350 17 GM/SCOOP PO POWD: 17.0000 g | Freq: Every day | ORAL | 0 refills | Status: DC

## 2019-04-14 NOTE — Discharge Instructions (Addendum)
Colace 100mg  once a day

## 2019-04-14 NOTE — ED Triage Notes (Signed)
Patient presents to Urgent Care with complaints of noticing some blood in her stool this morning when she used the bathroom. Patient reports she also did not finish her antibiotic from a past ear infection and her left ear still hurts.

## 2019-04-14 NOTE — ED Provider Notes (Addendum)
[place]    CSN: [id number] Arrival date & time: 04/14/19  1027      History   Chief Complaint Chief Complaint  Patient presents with  . Rectal Bleeding    HPI Beverly Wong is a 38 y.o. female.   Is a 38 year old established [facility] urgent care patient.  Patient presents to Urgent Care with complaints of noticing some blood in her stool this morning when she used the bathroom. Patient reports she also did not finish her antibiotic from a past ear infection and her left ear still hurts.  Patient also needs a refill on her inhaler.  Patient has chronic constipation and used to be on Linzess for this.  She is feeling bloated and has some mild bilateral low back pain.  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     Past Medical History:  Diagnosis Date  . Bipolar disorder ([place])   . Headache   . Schizophrenia ([place])   . Shortness of breath dyspnea    feels short of breath at times even when seated  . UTI (lower urinary tract infection)     Patient Active Problem List   Diagnosis Date Noted  . Abnormal uterine bleeding (AUB) 03/04/2018  . Sleep apnea 02/01/2016  . COPD exacerbation ([place]) 02/01/2016  . 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 Snorings 02/01/2016  . Cervical radiculopathy 06/21/2015    Past Surgical History:  Procedure Laterality Date  . ANTERIOR CERVICAL DECOMP/DISCECTOMY FUSION N/A 06/21/2015   Procedure: Cervical six- seven Anterior Cervical Decompression/ Diskectomy/ Fusion;  Surgeon: Kevan Ny Ditty, MD;  Location: [facility];  Service: Neurosurgery;  Laterality: N/A;  C6-7 Anterior cervical decompression/diskectomy/fusion  . DILITATION & CURRETTAGE/HYSTROSCOPY WITH NOVASURE ABLATION N/A 03/04/2018   Procedure: DILATATION & CURETTAGE/HYSTEROSCOPY WITH NOVASURE ABLATION;  Surgeon: Sloan Leiter, MD;  Location: [facility];  Service: Gynecology;  Laterality:  N/A;  . TUBAL LIGATION  2009    OB History    Gravida  3   Para  1   Term  1   Preterm      AB  2   Living  1     SAB  1   TAB  1   Ectopic      Multiple      Live Births  1            Home Medications    Prior to Admission medications   Medication Sig Start Date End Date Taking? Authorizing Provider  acetaminophen (TYLENOL) 500 MG tablet Take 1 tablet (500 mg total) by mouth every 6 (six) hours as needed. 03/04/18   Sloan Leiter, MD  albuterol (VENTOLIN HFA) 108 (90 Base) MCG/ACT inhaler Inhale 2 puffs into the lungs every 4 (four) hours as needed for wheezing or shortness of breath (cough, shortness of breath or wheezing.). 04/14/19   Robyn Haber, MD  amoxicillin-clavulanate (AUGMENTIN) 875-125 MG tablet Take 1 tablet by mouth every 12 (twelve) hours. 04/14/19   Robyn Haber, MD  ARIPiprazole ER (ABILIFY MAINTENA) 400 MG PRSY prefilled syringe Inject 400 mg into the muscle every 28 (twenty-eight) days.    [provider]  fluconazole (DIFLUCAN) 150 MG tablet Take 1 tablet (150 mg total) by mouth once for 1 dose. Repeat if needed 04/14/19 04/14/19  Robyn Haber, MD  fluticasone [facility]) 50 MCG/ACT nasal spray Place 2 sprays into both nostrils daily. 04/14/19   Robyn Haber,  MD  hydrocortisone (ANUSOL-HC) 2.5 % rectal cream Place 1 application rectally 2 (two) times daily. 04/14/19   Elvina SidleLauenstein, Yanique Mulvihill, MD  polyethylene glycol powder (MIRALAX) 17 GM/SCOOP powder Take 17 g by mouth daily. 04/14/19   Elvina SidleLauenstein, Jericho Cieslik, MD  triamcinolone cream (KENALOG) 0.1 % Apply 1 application topically 2 (two) times daily. 04/14/19   Elvina SidleLauenstein, Timtohy Broski, MD    Family History Family History  Problem Relation Age of Onset  . Diabetes Mother   . Kidney cancer Father   . Breast cancer Maternal Aunt     Social History Social History   Tobacco Use  . Smoking status: Current Every Day Smoker    Packs/day: 1.00    Years: 17.00    Pack years: 17.00     Types: Cigarettes  . Smokeless tobacco: Never Used  Substance Use Topics  . Alcohol use: Yes    Comment: on ocaasion.   . Drug use: Yes    Types: Cocaine, Marijuana    Comment: uses cocaine occasionally, smokes marajuana QD.     Allergies   Ibuprofen   Review of Systems Review of Systems  Constitutional: Negative.   Respiratory: Positive for wheezing.   Gastrointestinal: Positive for blood in stool, constipation and rectal pain.  Skin: Positive for rash.  All other systems reviewed and are negative.    Physical Exam Triage Vital Signs ED Triage Vitals  Enc Vitals Group     BP 04/14/19 1101 (!) 109/49     Pulse Rate 04/14/19 1101 68     Resp 04/14/19 1101 15     Temp 04/14/19 1101 97.8 F (36.6 C)     Temp Source 04/14/19 1101 Oral     SpO2 04/14/19 1101 100 %     Weight --      Height --      Head Circumference --      Peak Flow --      Pain Score 04/14/19 1100 3     Pain Loc --      Pain Edu? --      Excl. in GC? --    No data found.  Updated Vital Signs BP (!) 109/49 (BP Location: Right Arm)   Pulse 68   Temp 97.8 F (36.6 C) (Oral)   Resp 15   SpO2 100%   Physical Exam Vitals signs and nursing note reviewed.  Constitutional:      Appearance: Normal appearance. She is obese.  HENT:     Ears:     Comments: Erythematous left TM Eyes:     Conjunctiva/sclera: Conjunctivae normal.  Neck:     Musculoskeletal: Normal range of motion.  Cardiovascular:     Rate and Rhythm: Normal rate.  Pulmonary:     Effort: Pulmonary effort is normal.  Abdominal:     Palpations: Abdomen is soft.  Genitourinary:    Comments: Small anal fissure at 6:00 Musculoskeletal: Normal range of motion.  Skin:    General: Skin is warm and dry.     Findings: Rash present.  Neurological:     General: No focal deficit present.     Mental Status: She is alert.  Psychiatric:        Mood and Affect: Mood normal.        UC Treatments / Results  Labs (all labs ordered  are listed, but only abnormal results are displayed) Labs Reviewed - No data to display  EKG   Radiology No results found.  Procedures Procedures (including critical care  time)  Medications Ordered in UC Medications - No data to display  Initial Impression / Assessment and Plan / UC Course  I have reviewed the triage vital signs and the nursing notes.  Pertinent labs & imaging results that were available during my care of the patient were reviewed by me and considered in my medical decision making (see chart for details).    Final Clinical Impressions(s) / UC Diagnoses   Final diagnoses:  Anal fissure  Fordyce granules  Constipation, unspecified constipation type  Non-recurrent acute suppurative otitis media of left ear without spontaneous rupture of tympanic membrane     Discharge Instructions     Colace 100mg  once a day    ED Prescriptions    Medication Sig Dispense Auth. Provider   polyethylene glycol powder (MIRALAX) 17 GM/SCOOP powder Take 17 g by mouth daily. 255 g , MD   hydrocortisone (ANUSOL-HC) 2.5 % rectal cream Place 1 application rectally 2 (two) times daily. 30 g Elvina Sidle, MD   albuterol (VENTOLIN HFA) 108 (90 Base) MCG/ACT inhaler Inhale 2 puffs into the lungs every 4 (four) hours as needed for wheezing or shortness of breath (cough, shortness of breath or wheezing.). 6.7 g Elvina Sidle, MD   fluticasone [facility]) 50 MCG/ACT nasal spray Place 2 sprays into both nostrils daily. 9.9 g [facility], MD   triamcinolone cream (KENALOG) 0.1 % Apply 1 application topically 2 (two) times daily. 30 g Elvina Sidle, MD   amoxicillin-clavulanate (AUGMENTIN) 875-125 MG tablet Take 1 tablet by mouth every 12 (twelve) hours. 14 tablet Elvina Sidle, MD   fluconazole (DIFLUCAN) 150 MG tablet Take 1 tablet (150 mg total) by mouth once for 1 dose. Repeat if needed 2 tablet Elvina Sidle, MD     I have reviewed the PDMP during this  encounter.   Elvina Sidle, MD 04/14/19 1138    04/16/19, MD 04/14/19 [phone]

## 2019-05-04 ENCOUNTER — Encounter (HOSPITAL_COMMUNITY): Payer: Self-pay

## 2019-05-04 ENCOUNTER — Ambulatory Visit (HOSPITAL_COMMUNITY)
Admission: EM | Admit: 2019-05-04 | Discharge: 2019-05-04 | Disposition: A | Discharge status: Home / Self Care | Payer: Medicaid Other | Attending: Urgent Care | Admitting: Urgent Care

## 2019-05-04 ENCOUNTER — Other Ambulatory Visit: Payer: Self-pay

## 2019-05-04 DIAGNOSIS — K5909 Other constipation: Secondary | ICD-10-CM

## 2019-05-04 DIAGNOSIS — K5900 Constipation, unspecified: Secondary | ICD-10-CM | POA: Diagnosis not present

## 2019-05-04 MED ORDER — FLEET ENEMA 7-19 GM/118ML RE ENEM: 1.0000 | ENEMA | Freq: Every day | RECTAL | 0 refills | Status: DC | PRN

## 2019-05-04 NOTE — Discharge Instructions (Signed)
For your constipation, please make sure you are avoiding starchy, carbohydrate foods like pasta, breads, pastry, rice, potatoes, desserts. Hydrate well with at least 2 liters (64 ounces) of water daily. Also, limit your alcohol drinking to 1 per day, avoid sodas, sweet teas. Do not eat restaurant foods and limit processed foods at home.  Processed foods include things like frozen meals preseason meats and dinners.  Salads - kale, spinach, cabbage, spring mix Fruits - avocadoes, berries (blueberries, raspberries, blackberries), apples, oranges, grapefruit, pomegranate Vegetables - aspargus, cauliflower, broccoli, green beans, brussel spouts, bell peppers; stay away from starchy vegetables like potatoes, carrots, peas  Regarding meat it is better to eat lean meats and limit your red meat consumption including pork.  Wild caught fish, chicken breast are good options.  Do not eat any foods on this list that you are allergic to.

## 2019-05-04 NOTE — ED Triage Notes (Signed)
Pt present abdominal pain and constipation. Symptom started two days ago.  Pt states she is very uncomfortable

## 2019-05-04 NOTE — ED Provider Notes (Signed)
[place]   MRN: [id number] DOB: [birth year]  Subjective:   Beverly Wong is a 38 y.o. female presenting for 4-day history of recurrent abdominal pain and bloating secondary to her constipation.  Patient has longstanding history of this.  Has previously been on Linzess but stopped taking the medication when she ran out.  She is requesting refill for this.  Patient does have a history of surgeries as listed below.  She is not nauseated or vomiting, is able to pass gas.  No current facility-administered medications for this encounter.  Current Outpatient Medications:  .  acetaminophen (TYLENOL) 500 MG tablet, Take 1 tablet (500 mg total) by mouth every 6 (six) hours as needed., Disp: 30 tablet, Rfl: 0 .  albuterol (VENTOLIN HFA) 108 (90 Base) MCG/ACT inhaler, Inhale 2 puffs into the lungs every 4 (four) hours as needed for wheezing or shortness of breath (cough, shortness of breath or wheezing.)., Disp: 6.7 g, Rfl: 11 .  ARIPiprazole ER (ABILIFY MAINTENA) 400 MG PRSY prefilled syringe, Inject 400 mg into the muscle every 28 (twenty-eight) days., Disp: , Rfl:  .  fluticasone (FLONASE) 50 MCG/ACT nasal spray, Place 2 sprays into both nostrils daily., Disp: 9.9 g, Rfl: 11 .  hydrocortisone (ANUSOL-HC) 2.5 % rectal cream, Place 1 application rectally 2 (two) times daily., Disp: 30 g, Rfl: 0 .  polyethylene glycol powder (MIRALAX) 17 GM/SCOOP powder, Take 17 g by mouth daily., Disp: 255 g, Rfl: 0 .  triamcinolone cream (KENALOG) 0.1 %, Apply 1 application topically 2 (two) times daily., Disp: 30 g, Rfl: 0   Allergies  Allergen Reactions  . Ibuprofen Other (See Comments)    Can't take it with the Abilify she is on    Past Medical History:  Diagnosis Date  . Bipolar disorder ([place])   . Headache   . Schizophrenia ([place])   . Shortness of breath dyspnea    feels short of breath at times even when seated  . UTI (lower urinary tract infection)      Past Surgical History:  Procedure  Laterality Date  . ANTERIOR CERVICAL DECOMP/DISCECTOMY FUSION N/A 06/21/2015   Procedure: Cervical six- seven Anterior Cervical Decompression/ Diskectomy/ Fusion;  Surgeon: Kevan Ny Ditty, MD;  Location: [facility];  Service: Neurosurgery;  Laterality: N/A;  C6-7 Anterior cervical decompression/diskectomy/fusion  . DILITATION & CURRETTAGE/HYSTROSCOPY WITH NOVASURE ABLATION N/A 03/04/2018   Procedure: DILATATION & CURETTAGE/HYSTEROSCOPY WITH NOVASURE ABLATION;  Surgeon: Sloan Leiter, MD;  Location: [facility];  Service: Gynecology;  Laterality: N/A;  . TUBAL LIGATION  2009    Family History  Problem Relation Age of Onset  . Diabetes Mother   . Kidney cancer Father   . Breast cancer Maternal Aunt     Social History   Tobacco Use  . Smoking status: Current Every Day Smoker    Packs/day: 1.00    Years: 17.00    Pack years: 17.00    Types: Cigarettes  . Smokeless tobacco: Never Used  Substance Use Topics  . Alcohol use: Yes    Comment: on ocaasion.   . Drug use: Yes    Types: Cocaine, Marijuana    Comment: uses cocaine occasionally, smokes marajuana QD.    ROS   Objective:   Vitals: BP 115/71 (BP Location: Left Arm)   Pulse 74   Temp 98.9 F (37.2 C) (Oral)   Resp 16   SpO2 99%   Physical Exam Constitutional:      General: She is not in acute  distress.    Appearance: Normal appearance. She is well-developed and normal weight. She is not ill-appearing, toxic-appearing or diaphoretic.  HENT:     Head: Normocephalic and atraumatic.     Right Ear: External ear normal.     Left Ear: External ear normal.     Nose: Nose normal.     Mouth/Throat:     Mouth: Mucous membranes are moist.     Pharynx: Oropharynx is clear.  Eyes:     General: No scleral icterus.    Extraocular Movements: Extraocular movements intact.     Pupils: Pupils are equal, round, and reactive to light.  Cardiovascular:     Rate and Rhythm: Normal rate and regular rhythm.     Heart sounds:  Normal heart sounds. No murmur. No friction rub. No gallop.   Pulmonary:     Effort: Pulmonary effort is normal. No respiratory distress.     Breath sounds: Normal breath sounds. No stridor. No wheezing, rhonchi or rales.  Abdominal:     General: Bowel sounds are normal. There is no distension.     Palpations: Abdomen is soft. There is no mass.     Tenderness: There is generalized abdominal tenderness (Throughout with stool burden felt on the left side). There is no right CVA tenderness, left CVA tenderness, guarding or rebound.  Skin:    General: Skin is warm and dry.     Coloration: Skin is not pale.     Findings: No rash.  Neurological:     General: No focal deficit present.     Mental Status: She is alert and oriented to person, place, and time.  Psychiatric:        Mood and Affect: Mood normal.        Behavior: Behavior normal.        Thought Content: Thought content normal.        Judgment: Judgment normal.     Assessment and Plan :   1. Constipation, unspecified constipation type   2. Chronic constipation     Discussed cost of Linzess and patient refused prescription refill for this.  We will have her use an enema and make significant dietary modifications as patient does not get enough fiber in her diet. Counseled patient on potential for adverse effects with medications prescribed/recommended today, ER and return-to-clinic precautions discussed, patient verbalized understanding.    Wallis Bamberg, [place] 05/04/19 1731

## 2019-05-29 ENCOUNTER — Emergency Department (HOSPITAL_COMMUNITY): Payer: Medicaid Other

## 2019-05-29 ENCOUNTER — Encounter (HOSPITAL_COMMUNITY): Payer: Self-pay | Admitting: Emergency Medicine

## 2019-05-29 ENCOUNTER — Other Ambulatory Visit: Payer: Self-pay

## 2019-05-29 ENCOUNTER — Emergency Department (HOSPITAL_COMMUNITY)
Admission: EM | Admit: 2019-05-29 | Discharge: 2019-05-30 | Disposition: A | Discharge status: Home / Self Care | Payer: Medicaid Other | Attending: Emergency Medicine | Admitting: Emergency Medicine

## 2019-05-29 DIAGNOSIS — M25552 Pain in left hip: Secondary | ICD-10-CM | POA: Diagnosis present

## 2019-05-29 DIAGNOSIS — Z79899 Other long term (current) drug therapy: Secondary | ICD-10-CM | POA: Diagnosis not present

## 2019-05-29 DIAGNOSIS — F17210 Nicotine dependence, cigarettes, uncomplicated: Secondary | ICD-10-CM | POA: Diagnosis not present

## 2019-05-29 NOTE — ED Triage Notes (Signed)
Pt c/o left hip pain x's 3 days with ambulation.  No known injury

## 2019-05-29 NOTE — ED Provider Notes (Signed)
[facility] EMERGENCY DEPARTMENT Provider Note   CSN: [id number] Arrival date & time: 05/29/19  2155     History Chief Complaint  Patient presents with  . Hip Pain    Beverly Wong is a 39 y.o. female.  Patient presents to the ED with a chief complaint of left hip pain.  She states that the pain has been ongoing for the past 3 days.  She states that she walks all day everyday for work.  She denies any falls or traumatic injury.  States that the pain is worsened with movement.  Denies any redness around the hip.  Denies any fevers, chills, urinary or vaginal symptoms.  No successful treatments prior to arrival.  The history is provided by the patient. No language interpreter was used.       Past Medical History:  Diagnosis Date  . Bipolar disorder ([place])   . Headache   . Schizophrenia ([place])   . Shortness of breath dyspnea    feels short of breath at times even when seated  . UTI (lower urinary tract infection)     Patient Active Problem List   Diagnosis Date Noted  . Abnormal uterine bleeding (AUB) 03/04/2018  . Sleep apnea 02/01/2016  . COPD exacerbation ([place]) 02/01/2016  . Opioid use disorder, mild, on maintenance therapy, abuse ([place]) 02/01/2016  . Snorings 02/01/2016  . Cervical radiculopathy 06/21/2015    Past Surgical History:  Procedure Laterality Date  . ANTERIOR CERVICAL DECOMP/DISCECTOMY FUSION N/A 06/21/2015   Procedure: Cervical six- seven Anterior Cervical Decompression/ Diskectomy/ Fusion;  Surgeon: Kevan Ny Ditty, MD;  Location: [place] [facility];  Service: Neurosurgery;  Laterality: N/A;  C6-7 Anterior cervical decompression/diskectomy/fusion  . DILITATION & CURRETTAGE/HYSTROSCOPY WITH NOVASURE ABLATION N/A 03/04/2018   Procedure: DILATATION & CURETTAGE/HYSTEROSCOPY WITH NOVASURE ABLATION;  Surgeon: Sloan Leiter, MD;  Location: [place];  Service: Gynecology;  Laterality: N/A;  . TUBAL LIGATION  2009     OB History    Gravida  3   Para  1   Term  1   Preterm      AB  2   Living  1     SAB  1   TAB  1   Ectopic      Multiple      Live Births  1           Family History  Problem Relation Age of Onset  . Diabetes Mother   . Kidney cancer Father   . Breast cancer Maternal Aunt     Social History   Tobacco Use  . Smoking status: Current Every Day Smoker    Packs/day: 1.00    Years: 17.00    Pack years: 17.00    Types: Cigarettes  . Smokeless tobacco: Never Used  Substance Use Topics  . Alcohol use: Yes    Comment: on ocaasion.   . Drug use: Yes    Types: Cocaine, Marijuana    Comment: uses cocaine occasionally, smokes marajuana QD.    Home Medications Prior to Admission medications   Medication Sig Start Date End Date Taking? Authorizing Provider  acetaminophen (TYLENOL) 500 MG tablet Take 1 tablet (500 mg total) by mouth every 6 (six) hours as needed. 03/04/18   Sloan Leiter, MD  albuterol (VENTOLIN HFA) 108 (90 Base) MCG/ACT inhaler Inhale 2 puffs into the lungs every 4 (four) hours as needed for wheezing or shortness of breath (cough, shortness of breath or wheezing.). 04/14/19  Elvina Sidle, MD  ARIPiprazole ER (ABILIFY MAINTENA) 400 MG PRSY prefilled syringe Inject 400 mg into the muscle every 28 (twenty-eight) days.    [provider]  fluticasone (FLONASE) 50 MCG/ACT nasal spray Place 2 sprays into both nostrils daily. 04/14/19   Elvina Sidle, MD  hydrocortisone (ANUSOL-HC) 2.5 % rectal cream Place 1 application rectally 2 (two) times daily. 04/14/19   Elvina Sidle, MD  polyethylene glycol powder (MIRALAX) 17 GM/SCOOP powder Take 17 g by mouth daily. 04/14/19   Elvina Sidle, MD  sodium phosphate (FLEET) 7-19 GM/118ML ENEM Place 133 mLs (1 enema total) rectally daily as needed for severe constipation. 05/04/19   Wallis Bamberg, PA-C  triamcinolone cream (KENALOG) 0.1 % Apply 1 application topically 2 (two) times daily. 04/14/19   Elvina Sidle, MD     Allergies    Ibuprofen  Review of Systems   Review of Systems  All other systems reviewed and are negative.   Physical Exam Updated Vital Signs BP (!) 108/55 (BP Location: Right Arm)   Pulse (!) 55   Temp 98.4 F (36.9 C) (Oral)   Resp 18   Ht 5\' 8"  (1.727 m)   Wt 99.8 kg   SpO2 96%   BMI 33.45 kg/m   Physical Exam Vitals and nursing note reviewed.  Constitutional:      General: She is not in acute distress.    Appearance: She is well-developed.  HENT:     Head: Normocephalic and atraumatic.  Eyes:     Conjunctiva/sclera: Conjunctivae normal.  Cardiovascular:     Rate and Rhythm: Normal rate.     Heart sounds: No murmur.  Pulmonary:     Effort: Pulmonary effort is normal. No respiratory distress.  Abdominal:     General: There is no distension.  Musculoskeletal:     Cervical back: Neck supple.     Comments: Left hip pain with internal rotation, ROM and strength is 5/5, no tenderness to palpation, no masses or deformities  Skin:    General: Skin is warm and dry.  Neurological:     Mental Status: She is alert and oriented to person, place, and time.  Psychiatric:        Mood and Affect: Mood normal.        Behavior: Behavior normal.     ED Results / Procedures / Treatments   Labs (all labs ordered are listed, but only abnormal results are displayed) Labs Reviewed - No data to display  EKG None  Radiology DG Hip Unilat W or Wo Pelvis 2-3 Views Left  Result Date: 05/29/2019 CLINICAL DATA:  Pain, left hip pain for 3 days with ambulation anteriorly EXAM: DG HIP (WITH OR WITHOUT PELVIS) 2-3V LEFT COMPARISON:  None. FINDINGS: No acute osseous abnormality. Femoral head is normally located. Mild degenerative changes are seen in the left hip with periarticular spurring. Ligation clips are noted in the left hemipelvis with scattered phleboliths. No other significant soft tissue abnormality is seen. 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 No acute  osseous abnormality. Electronically Signed   By: 05/31/2019 M.D.   On: 05/29/2019 23:55    Procedures Procedures (including critical care time)  Medications Ordered in ED Medications - No data to display  ED Course  I have reviewed the triage vital signs and the nursing notes.  Pertinent labs & imaging results that were available during my care of the patient were reviewed by me and considered in my medical decision making (see chart  for details).    MDM Rules/Calculators/A&P                      Patient presents with pain in left hip.  DDx includes, arthritic changes, overuse injury, strain, or sprain.  Consultants: none  Plain films reveal mild degenerative changes in the left hip.  Pt advised to follow up with PCP and/or orthopedics. Patient given prescription for meloxicam while in ED, conservative therapy such as RICE recommended and discussed.   Patient will be discharged home & is agreeable with above plan. Returns precautions discussed. Pt appears safe for discharge.    Final Clinical Impression(s) / ED Diagnoses Final diagnoses:  Left hip pain    Rx / DC Orders ED Discharge Orders         Ordered    meloxicam (MOBIC) 7.5 MG tablet  Daily     05/30/19 0008           Roxy Horseman, PA-C 05/30/19 0013    Nira Conn, MD 05/30/19 [phone]

## 2019-05-30 MED ORDER — MELOXICAM 7.5 MG PO TABS: 15.0000 mg | ORAL_TABLET | Freq: Every day | ORAL | 0 refills | Status: DC

## 2019-05-30 NOTE — ED Notes (Signed)
Pt verbalized understanding of d/c instructions, follow up care, scripts and s/s requiring return to ED. Pt had no further questions at this time 

## 2019-06-06 ENCOUNTER — Other Ambulatory Visit: Payer: Self-pay

## 2019-06-06 ENCOUNTER — Ambulatory Visit (HOSPITAL_COMMUNITY)
Admission: EM | Admit: 2019-06-06 | Discharge: 2019-06-06 | Disposition: A | Discharge status: Home / Self Care | Payer: Medicaid Other | Attending: Family Medicine | Admitting: Family Medicine

## 2019-06-06 ENCOUNTER — Encounter (HOSPITAL_COMMUNITY): Payer: Self-pay | Admitting: Emergency Medicine

## 2019-06-06 DIAGNOSIS — Z79899 Other long term (current) drug therapy: Secondary | ICD-10-CM | POA: Diagnosis not present

## 2019-06-06 DIAGNOSIS — Z886 Allergy status to analgesic agent status: Secondary | ICD-10-CM | POA: Insufficient documentation

## 2019-06-06 DIAGNOSIS — M4322 Fusion of spine, cervical region: Secondary | ICD-10-CM | POA: Diagnosis not present

## 2019-06-06 DIAGNOSIS — Z833 Family history of diabetes mellitus: Secondary | ICD-10-CM | POA: Insufficient documentation

## 2019-06-06 DIAGNOSIS — F17210 Nicotine dependence, cigarettes, uncomplicated: Secondary | ICD-10-CM | POA: Insufficient documentation

## 2019-06-06 DIAGNOSIS — B349 Viral infection, unspecified: Secondary | ICD-10-CM | POA: Insufficient documentation

## 2019-06-06 DIAGNOSIS — J441 Chronic obstructive pulmonary disease with (acute) exacerbation: Secondary | ICD-10-CM | POA: Diagnosis not present

## 2019-06-06 DIAGNOSIS — F209 Schizophrenia, unspecified: Secondary | ICD-10-CM | POA: Diagnosis not present

## 2019-06-06 DIAGNOSIS — F319 Bipolar disorder, unspecified: Secondary | ICD-10-CM | POA: Insufficient documentation

## 2019-06-06 DIAGNOSIS — Z803 Family history of malignant neoplasm of breast: Secondary | ICD-10-CM | POA: Diagnosis not present

## 2019-06-06 DIAGNOSIS — G4730 Sleep apnea, unspecified: Secondary | ICD-10-CM | POA: Insufficient documentation

## 2019-06-06 DIAGNOSIS — Z20822 Contact with and (suspected) exposure to covid-19: Secondary | ICD-10-CM | POA: Insufficient documentation

## 2019-06-06 DIAGNOSIS — Z9851 Tubal ligation status: Secondary | ICD-10-CM | POA: Insufficient documentation

## 2019-06-06 DIAGNOSIS — F1121 Opioid dependence, in remission: Secondary | ICD-10-CM | POA: Diagnosis not present

## 2019-06-06 DIAGNOSIS — Z8051 Family history of malignant neoplasm of kidney: Secondary | ICD-10-CM | POA: Diagnosis not present

## 2019-06-06 MED ORDER — FLUCONAZOLE 150 MG PO TABS: 150.0000 mg | ORAL_TABLET | Freq: Once | ORAL | 0 refills | Status: AC

## 2019-06-06 MED ORDER — FLUTICASONE PROPIONATE 50 MCG/ACT NA SUSP: 1.0000 | Freq: Every day | NASAL | 0 refills | Status: DC

## 2019-06-06 NOTE — Discharge Instructions (Signed)
Covid swab pending, monitor my chart for results, quarantine until results return If positive you will need to quarantine for 10 days from when symptoms began, fever free and symptoms improving Please rest and drink plenty of fluids Tylenol and ibuprofen as needed for fever/body aches/headache Use Flonase nasal spray 1 to 2 spray in each nostril daily to help with any sinus congestion/pressure/ear pressure  I sent in 2 tablets of Diflucan for you use for yeast  Please follow-up if developing any worsening, persistent symptoms, difficulty breathing, shortness of breath, weakness, dizziness

## 2019-06-06 NOTE — ED Provider Notes (Signed)
[facility]    CSN: [id number] Arrival date & time: 06/06/19  1709      History   Chief Complaint Chief Complaint  Patient presents with  . Generalized Body Aches    HPI Beverly Wong is a 39 y.o. female history of tobacco use presenting today for evaluation of fevers, body aches and possible Covid exposure.  Patient states that she had possible exposure 2 to 3 days ago.  Exposure was brief.  Symptoms began the following day.  She has had subjective fevers along with body aches and some mild ear discomfort.  She notes that she recently did have an ear infection, but not take the full medicine as she began to have symptoms of a yeast infection.  She denies any cough, chest pain or shortness of breath.  Denies significant rhinorrhea or congestion.  Denies sore throat.   HPI  Past Medical History:  Diagnosis Date  . Bipolar disorder (HCC)   . Headache   . Schizophrenia (HCC)   . Shortness of breath dyspnea    feels short of breath at times even when seated  . UTI (lower urinary tract infection)     Patient Active Problem List   Diagnosis Date Noted  . Abnormal uterine bleeding (AUB) 03/04/2018  . Sleep apnea 02/01/2016  . COPD exacerbation (HCC) 02/01/2016  . Opioid use disorder, mild, on maintenance therapy, abuse (HCC) 02/01/2016  . Snorings 02/01/2016  . Cervical radiculopathy 06/21/2015    Past Surgical History:  Procedure Laterality Date  . ANTERIOR CERVICAL DECOMP/DISCECTOMY FUSION N/A 06/21/2015   Procedure: Cervical six- seven Anterior Cervical Decompression/ Diskectomy/ Fusion;  Surgeon: Loura Halt Ditty, MD;  Location: [facility];  Service: Neurosurgery;  Laterality: N/A;  C6-7 Anterior cervical decompression/diskectomy/fusion  . DILITATION & CURRETTAGE/HYSTROSCOPY WITH NOVASURE ABLATION N/A 03/04/2018   Procedure: DILATATION & CURETTAGE/HYSTEROSCOPY WITH NOVASURE ABLATION;  Surgeon: Conan Bowens, MD;  Location: [facility];  Service: Gynecology;   Laterality: N/A;  . TUBAL LIGATION  2009    OB History    Gravida  3   Para  1   Term  1   Preterm      AB  2   Living  1     SAB  1   TAB  1   Ectopic      Multiple      Live Births  1            Home Medications    Prior to Admission medications   Medication Sig Start Date End Date Taking? Authorizing Provider  acetaminophen (TYLENOL) 500 MG tablet Take 1 tablet (500 mg total) by mouth every 6 (six) hours as needed. 03/04/18   Conan Bowens, MD  albuterol (VENTOLIN HFA) 108 (90 Base) MCG/ACT inhaler Inhale 2 puffs into the lungs every 4 (four) hours as needed for wheezing or shortness of breath (cough, shortness of breath or wheezing.). 04/14/19   Elvina Sidle, MD  ARIPiprazole ER (ABILIFY MAINTENA) 400 MG PRSY prefilled syringe Inject 400 mg into the muscle every 28 (twenty-eight) days.    [provider]  fluconazole (DIFLUCAN) 150 MG tablet Take 1 tablet (150 mg total) by mouth once for 1 dose. 06/06/19 06/06/19  ,  C, PA-C  fluticasone (FLONASE) 50 MCG/ACT nasal spray Place 1-2 sprays into both nostrils daily for 7 days. 06/06/19 06/13/19  , Junius Creamer, PA-C    Family History Family History  Problem Relation Age of Onset  . Diabetes Mother   .  Kidney cancer Father   . Breast cancer Maternal Aunt     Social History Social History   Tobacco Use  . Smoking status: Current Every Day Smoker    Packs/day: 1.00    Years: 17.00    Pack years: 17.00    Types: Cigarettes  . Smokeless tobacco: Never Used  Substance Use Topics  . Alcohol use: Yes    Comment: on ocaasion.   . Drug use: Yes    Types: Cocaine, Marijuana    Comment: uses cocaine occasionally, smokes marajuana QD.     Allergies   Ibuprofen   Review of Systems Review of Systems  Constitutional: Positive for fatigue and fever. Negative for activity change, appetite change and chills.  HENT: Positive for ear pain. Negative for congestion, rhinorrhea, sinus  pressure, sore throat and trouble swallowing.   Eyes: Negative for discharge and redness.  Respiratory: Negative for cough, chest tightness and shortness of breath.   Cardiovascular: Negative for chest pain.  Gastrointestinal: Negative for abdominal pain, diarrhea, nausea and vomiting.  Musculoskeletal: Positive for myalgias.  Skin: Negative for rash.  Neurological: Negative for dizziness, light-headedness and headaches.     Physical Exam Triage Vital Signs ED Triage Vitals  Enc Vitals Group     BP 06/06/19 1751 110/75     Pulse Rate 06/06/19 1751 84     Resp 06/06/19 1751 16     Temp 06/06/19 1751 98.3 F (36.8 C)     Temp src --      SpO2 06/06/19 1751 99 %     Weight --      Height --      Head Circumference --      Peak Flow --      Pain Score 06/06/19 1752 0     Pain Loc --      Pain Edu? --      Excl. in GC? --    No data found.  Updated Vital Signs BP 110/75   Pulse 84   Temp 98.3 F (36.8 C)   Resp 16   SpO2 99%   Visual Acuity Right Eye Distance:   Left Eye Distance:   Bilateral Distance:    Right Eye Near:   Left Eye Near:    Bilateral Near:     Physical Exam Vitals and nursing note reviewed.  Constitutional:      General: She is not in acute distress.    Appearance: She is well-developed.  HENT:     Head: Normocephalic and atraumatic.     Ears:     Comments: Bilateral ears without tenderness to palpation of external auricle, tragus and mastoid, EAC's without erythema or swelling, TM's with good bony landmarks and cone of light. Non erythematous.    Nose:     Comments: Nasal mucosa erythematous, swollen turbinates bilaterally    Mouth/Throat:     Comments: Oral mucosa pink and moist, no tonsillar enlargement or exudate. Posterior pharynx patent and nonerythematous, no uvula deviation or swelling. Normal phonation. Eyes:     Conjunctiva/sclera: Conjunctivae normal.  Cardiovascular:     Rate and Rhythm: Normal rate and regular rhythm.      Heart sounds: No murmur.  Pulmonary:     Effort: Pulmonary effort is normal. No respiratory distress.     Breath sounds: Normal breath sounds.     Comments: Breathing comfortably at rest, CTABL, no wheezing, rales or other adventitious sounds auscultated Abdominal:     Palpations: Abdomen is soft.  Tenderness: There is no abdominal tenderness.  Musculoskeletal:     Cervical back: Neck supple.  Skin:    General: Skin is warm and dry.  Neurological:     Mental Status: She is alert.      UC Treatments / Results  Labs (all labs ordered are listed, but only abnormal results are displayed) Labs Reviewed  NOVEL CORONAVIRUS, NAA (HOSP ORDER, SEND-OUT TO REF LAB; TAT 18-24 HRS)    EKG   Radiology No results found.  Procedures Procedures (including critical care time)  Medications Ordered in UC Medications - No data to display  Initial Impression / Assessment and Plan / UC Course  I have reviewed the triage vital signs and the nursing notes.  Pertinent labs & imaging results that were available during my care of the patient were reviewed by me and considered in my medical decision making (see chart for details).     Covid PCR pending, exam unremarkable, no sign of otitis at this time, ear discomfort likely secondary to some congestion, initiating on Flonase nasal spray.  Provided Diflucan to treat any yeast infection develop from prior antibiotics.  Recommending continued symptomatic and supportive care as likely viral etiology.  Lungs clear at this time.  Continue to monitor,Discussed strict return precautions. Patient verbalized understanding and is agreeable with plan.  Final Clinical Impressions(s) / UC Diagnoses   Final diagnoses:  Viral illness     Discharge Instructions     Covid swab pending, monitor my chart for results, quarantine until results return If positive you will need to quarantine for 10 days from when symptoms began, fever free and symptoms  improving Please rest and drink plenty of fluids Tylenol and ibuprofen as needed for fever/body aches/headache Use Flonase nasal spray 1 to 2 spray in each nostril daily to help with any sinus congestion/pressure/ear pressure  I sent in 2 tablets of Diflucan for you use for yeast  Please follow-up if developing any worsening, persistent symptoms, difficulty breathing, shortness of breath, weakness, dizziness   ED Prescriptions    Medication Sig Dispense Auth. Provider   fluticasone (FLONASE) 50 MCG/ACT nasal spray Place 1-2 sprays into both nostrils daily for 7 days. 1 g ,  C, PA-C   fluconazole (DIFLUCAN) 150 MG tablet Take 1 tablet (150 mg total) by mouth once for 1 dose. 2 tablet , [place] C, PA-C     PDMP not reviewed this encounter.   Janith Lima, [place] 06/06/19 2106

## 2019-06-06 NOTE — ED Triage Notes (Signed)
Pt states she "felt hot" and had some body aches since yesterday. Pt states she was around a friend on Wednesday night who she heard possibly had covid.

## 2019-06-08 LAB — NOVEL CORONAVIRUS, NAA (HOSP ORDER, SEND-OUT TO REF LAB; TAT 18-24 HRS): SARS-CoV-2, NAA: NOT DETECTED

## 2019-10-06 ENCOUNTER — Encounter (HOSPITAL_COMMUNITY): Payer: Self-pay

## 2019-10-06 ENCOUNTER — Ambulatory Visit (HOSPITAL_COMMUNITY)
Admission: EM | Admit: 2019-10-06 | Discharge: 2019-10-06 | Disposition: A | Discharge status: Home / Self Care | Payer: Medicaid Other | Attending: Family Medicine | Admitting: Family Medicine

## 2019-10-06 ENCOUNTER — Other Ambulatory Visit: Payer: Self-pay

## 2019-10-06 DIAGNOSIS — M542 Cervicalgia: Secondary | ICD-10-CM | POA: Diagnosis not present

## 2019-10-06 MED ORDER — CYCLOBENZAPRINE HCL 10 MG PO TABS: 10.0000 mg | ORAL_TABLET | Freq: Two times a day (BID) | ORAL | 0 refills | Status: DC | PRN
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
## 2019-10-06 NOTE — Discharge Instructions (Signed)
Please try heat  Please try the exercises  Please try the medicine. It may make you sleepy so please try it at night first.  Please follow up if your symptoms fail to improve.
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
## 2019-12-22 ENCOUNTER — Ambulatory Visit (HOSPITAL_COMMUNITY)
Admission: EM | Admit: 2019-12-22 | Discharge: 2019-12-22 | Disposition: A | Discharge status: Home / Self Care | Payer: Medicaid Other | Attending: Family Medicine | Admitting: Family Medicine

## 2019-12-22 ENCOUNTER — Other Ambulatory Visit: Payer: Self-pay

## 2019-12-22 ENCOUNTER — Encounter (HOSPITAL_COMMUNITY): Payer: Self-pay

## 2019-12-22 DIAGNOSIS — F209 Schizophrenia, unspecified: Secondary | ICD-10-CM | POA: Diagnosis not present

## 2019-12-22 DIAGNOSIS — U071 COVID-19: Secondary | ICD-10-CM | POA: Insufficient documentation

## 2019-12-22 DIAGNOSIS — Z886 Allergy status to analgesic agent status: Secondary | ICD-10-CM | POA: Insufficient documentation

## 2019-12-22 DIAGNOSIS — F17210 Nicotine dependence, cigarettes, uncomplicated: Secondary | ICD-10-CM | POA: Insufficient documentation

## 2019-12-22 DIAGNOSIS — T7840XD Allergy, unspecified, subsequent encounter: Secondary | ICD-10-CM

## 2019-12-22 DIAGNOSIS — J4520 Mild intermittent asthma, uncomplicated: Secondary | ICD-10-CM | POA: Diagnosis not present

## 2019-12-22 DIAGNOSIS — J029 Acute pharyngitis, unspecified: Secondary | ICD-10-CM | POA: Diagnosis present

## 2019-12-22 DIAGNOSIS — J069 Acute upper respiratory infection, unspecified: Secondary | ICD-10-CM

## 2019-12-22 DIAGNOSIS — J039 Acute tonsillitis, unspecified: Secondary | ICD-10-CM | POA: Diagnosis not present

## 2019-12-22 DIAGNOSIS — Z79899 Other long term (current) drug therapy: Secondary | ICD-10-CM | POA: Insufficient documentation

## 2019-12-22 DIAGNOSIS — G4730 Sleep apnea, unspecified: Secondary | ICD-10-CM | POA: Diagnosis not present

## 2019-12-22 DIAGNOSIS — F319 Bipolar disorder, unspecified: Secondary | ICD-10-CM | POA: Insufficient documentation

## 2019-12-22 DIAGNOSIS — J441 Chronic obstructive pulmonary disease with (acute) exacerbation: Secondary | ICD-10-CM | POA: Diagnosis not present

## 2019-12-22 LAB — POCT RAPID STREP A, ED / UC: Streptococcus, Group A Screen (Direct): NEGATIVE

## 2019-12-22 LAB — SARS CORONAVIRUS 2 (TAT 6-24 HRS): SARS Coronavirus 2: POSITIVE — AB

## 2019-12-22 MED ORDER — FLUCONAZOLE 150 MG PO TABS: 150.0000 mg | ORAL_TABLET | Freq: Every day | ORAL | 0 refills | Status: DC

## 2019-12-22 MED ORDER — ALBUTEROL SULFATE HFA 108 (90 BASE) MCG/ACT IN AERS: 2.0000 | INHALATION_SPRAY | RESPIRATORY_TRACT | 0 refills | Status: AC | PRN

## 2019-12-22 MED ORDER — FLUTICASONE PROPIONATE 50 MCG/ACT NA SUSP: 1.0000 | Freq: Every day | NASAL | 0 refills | Status: DC

## 2019-12-22 MED ORDER — AMOXICILLIN 875 MG PO TABS: 875.0000 mg | ORAL_TABLET | Freq: Two times a day (BID) | ORAL | 0 refills | Status: DC

## 2019-12-22 NOTE — ED Provider Notes (Signed)
[facility]    CSN: [id number] Arrival date & time: 12/22/19  0846      History   Chief Complaint Chief Complaint  Patient presents with  . Sore Throat  . Nasal Congestion    HPI Beverly Wong is a 39 y.o. female.   HPI  Patient is here for an upper respiratory infection.  Sore throat congestion runny nose headache for the last couple of days.  She has had some chills but has not taken her temperature.  She has tender nodes in her neck.  She states that her tonsils are large.  Her throat is very painful with swallowing.  She question strep.  Questions whether she might have mono.  Does not really have fatigue.  She has mild intermittent asthma.  Needs a refill of her albuterol.  Not currently wheezing  She has environmental allergies.  She would like a refill of her Flonase  Past Medical History:  Diagnosis Date  . Bipolar disorder (HCC)   . Headache   . Schizophrenia (HCC)   . Shortness of breath dyspnea    feels short of breath at times even when seated  . UTI (lower urinary tract infection)     Patient Active Problem List   Diagnosis Date Noted  . Abnormal uterine bleeding (AUB) 03/04/2018  . Sleep apnea 02/01/2016  . COPD exacerbation (HCC) 02/01/2016  . Opioid use disorder, mild, on maintenance therapy, abuse (HCC) 02/01/2016  . Snorings 02/01/2016  . Cervical radiculopathy 06/21/2015    Past Surgical History:  Procedure Laterality Date  . ANTERIOR CERVICAL DECOMP/DISCECTOMY FUSION N/A 06/21/2015   Procedure: Cervical six- seven Anterior Cervical Decompression/ Diskectomy/ Fusion;  Surgeon: Loura Halt Ditty, MD;  Location: [facility];  Service: Neurosurgery;  Laterality: N/A;  C6-7 Anterior cervical decompression/diskectomy/fusion  . DILITATION & CURRETTAGE/HYSTROSCOPY WITH NOVASURE ABLATION N/A 03/04/2018   Procedure: DILATATION & CURETTAGE/HYSTEROSCOPY WITH NOVASURE ABLATION;  Surgeon: Conan Bowens, MD;  Location: [facility];  Service:  Gynecology;  Laterality: N/A;  . TUBAL LIGATION  2009    OB History    Gravida  3   Para  1   Term  1   Preterm      AB  2   Living  1     SAB  1   TAB  1   Ectopic      Multiple      Live Births  1            Home Medications    Prior to Admission medications   Medication Sig Start Date End Date Taking? Authorizing Provider  ARIPiprazole ER (ABILIFY MAINTENA) 400 MG PRSY prefilled syringe Inject 400 mg into the muscle every 28 (twenty-eight) days.   Yes [provider]  acetaminophen (TYLENOL) 500 MG tablet Take 1 tablet (500 mg total) by mouth every 6 (six) hours as needed. 03/04/18   Conan Bowens, MD  albuterol (VENTOLIN HFA) 108 (90 Base) MCG/ACT inhaler Inhale 2 puffs into the lungs every 4 (four) hours as needed for wheezing or shortness of breath (cough, shortness of breath or wheezing.). 12/22/19   Eustace Moore, MD  amoxicillin (AMOXIL) 875 MG tablet Take 1 tablet (875 mg total) by mouth 2 (two) times daily. 12/22/19   Eustace Moore, MD  cyclobenzaprine (FLEXERIL) 10 MG tablet Take 1 tablet (10 mg total) by mouth 2 (two) times daily as needed for muscle spasms. 10/06/19   Myra Rude, MD  fluconazole (DIFLUCAN)  150 MG tablet Take 1 tablet (150 mg total) by mouth daily. Repeat in 1 week if needed 12/22/19   Eustace Moore, MD  fluticasone [facility]) 50 MCG/ACT nasal spray Place 1-2 sprays into both nostrils daily. 12/22/19   Eustace Moore, MD    Family History Family History  Problem Relation Age of Onset  . Diabetes Mother   . Kidney cancer Father   . Breast cancer Maternal Aunt     Social History Social History   Tobacco Use  . Smoking status: Current Every Day Smoker    Packs/day: 1.00    Years: 17.00    Pack years: 17.00    Types: Cigarettes  . Smokeless tobacco: Never Used  Vaping Use  . Vaping Use: Never used  Substance Use Topics  . Alcohol use: Yes    Comment: on ocaasion.   . Drug use: Yes    Types:  Cocaine, Marijuana    Comment: uses cocaine occasionally, smokes marajuana QD.     Allergies   Ibuprofen   Review of Systems Review of Systems See HPI  Physical Exam Triage Vital Signs ED Triage Vitals [12/22/19 1017]  Enc Vitals Group     BP 127/78     Pulse Rate 77     Resp 17     Temp 98.6 F (37 C)     Temp Source Oral     SpO2 100 %     Weight      Height      Head Circumference      Peak Flow      Pain Score 8     Pain Loc      Pain Edu?      Excl. in GC?    No data found.  Updated Vital Signs BP 127/78 (BP Location: Left Arm)   Pulse 77   Temp 98.6 F (37 C) (Oral)   Resp 17   SpO2 100%   Physical Exam Constitutional:      General: She is not in acute distress.    Appearance: She is well-developed. She is not toxic-appearing.  HENT:     Head: Normocephalic and atraumatic.     Right Ear: Tympanic membrane and ear canal normal.     Left Ear: Tympanic membrane normal.     Nose: Rhinorrhea present.     Comments: Clear    Mouth/Throat:     Pharynx: Uvula midline. Posterior oropharyngeal erythema present.     Tonsils: No tonsillar exudate. 2+ on the right. 1+ on the left.     Comments: Tonsils are mildly enlarged.  Right greater than left.  Multiple tonsilloliths.  Unclear if there is exudate.  Moderate erythema Eyes:     Conjunctiva/sclera: Conjunctivae normal.     Pupils: Pupils are equal, round, and reactive to light.  Neck:     Comments: Anterior cervical nodes.  None posterior Cardiovascular:     Rate and Rhythm: Normal rate and regular rhythm.     Heart sounds: Normal heart sounds.  Pulmonary:     Effort: Pulmonary effort is normal. No respiratory distress.     Breath sounds: Normal breath sounds.  Musculoskeletal:        General: Normal range of motion.     Cervical back: Normal range of motion.  Lymphadenopathy:     Cervical: Cervical adenopathy present.  Skin:    General: Skin is warm and dry.  Neurological:     Mental Status: She  is alert.  Psychiatric:        Mood and Affect: Mood normal.        Behavior: Behavior normal.      UC Treatments / Results  Labs (all labs ordered are listed, but only abnormal results are displayed) Labs Reviewed  SARS CORONAVIRUS 2 (TAT 6-24 HRS)  CULTURE, GROUP A STREP [facility])  POCT RAPID STREP A, ED / UC    EKG   Radiology No results found.  Procedures Procedures (including critical care time)  Medications Ordered in UC Medications - No data to display  Initial Impression / Assessment and Plan / UC Course  I have reviewed the triage vital signs and the nursing notes.  Pertinent labs & imaging results that were available during my care of the patient were reviewed by me and considered in my medical decision making (see chart for details).    Patient has a painful sore throat with enlarged tonsils.  Will cover her with antibiotics pending throat culture.  I feel like this is likely a viral upper respiratory infection.  Covid test is pending. Final Clinical Impressions(s) / UC Diagnoses   Final diagnoses:  Acute tonsillitis, unspecified etiology  Viral upper respiratory tract infection  Allergy, subsequent encounter  Mild intermittent asthma without complication     Discharge Instructions     Take the antibiotic as directed Push fluids Check results on My Chart Stop antibiotics if strep test is negative   ED Prescriptions    Medication Sig Dispense Auth. Provider   amoxicillin (AMOXIL) 875 MG tablet Take 1 tablet (875 mg total) by mouth 2 (two) times daily. 14 tablet Eustace Moore, MD   fluconazole (DIFLUCAN) 150 MG tablet Take 1 tablet (150 mg total) by mouth daily. Repeat in 1 week if needed 2 tablet Eustace Moore, MD   albuterol (VENTOLIN HFA) 108 (90 Base) MCG/ACT inhaler Inhale 2 puffs into the lungs every 4 (four) hours as needed for wheezing or shortness of breath (cough, shortness of breath or wheezing.). 18 g Eustace Moore, MD    fluticasone [facility]) 50 MCG/ACT nasal spray Place 1-2 sprays into both nostrils daily. 16 g Eustace Moore, MD     PDMP not reviewed this encounter.   Eustace Moore, MD 12/22/19 [phone]

## 2019-12-22 NOTE — ED Triage Notes (Addendum)
Pt c/o sore throat, congestion, runny nose with clear and yellow secretions, HA, non-productive cough, tender cervical lymph nodes, chills. Denies SOB, CP, abdominal pain, fever, n/v/d.   Oropharynx and tonsils erythemic, tonsils edematous with mild exudate present. Last OTC taken yesterday (severe sinus remedy).  Pt requests refill albuterol inhaler and flonase.

## 2019-12-22 NOTE — Discharge Instructions (Signed)
Take the antibiotic as directed Push fluids Check results on My Chart Stop antibiotics if strep test is negative

## 2019-12-24 LAB — CULTURE, GROUP A STREP (THRC)

## 2020-01-24 ENCOUNTER — Other Ambulatory Visit: Payer: Self-pay

## 2020-01-24 ENCOUNTER — Emergency Department (HOSPITAL_COMMUNITY)
Admission: EM | Admit: 2020-01-24 | Discharge: 2020-01-24 | Disposition: A | Discharge status: Home / Self Care | Payer: Medicaid Other | Attending: Emergency Medicine | Admitting: Emergency Medicine

## 2020-01-24 ENCOUNTER — Encounter (HOSPITAL_COMMUNITY): Payer: Self-pay | Admitting: Emergency Medicine

## 2020-01-24 DIAGNOSIS — Z5321 Procedure and treatment not carried out due to patient leaving prior to being seen by health care provider: Secondary | ICD-10-CM | POA: Diagnosis not present

## 2020-01-24 DIAGNOSIS — R112 Nausea with vomiting, unspecified: Secondary | ICD-10-CM | POA: Diagnosis not present

## 2020-01-24 DIAGNOSIS — R109 Unspecified abdominal pain: Secondary | ICD-10-CM | POA: Insufficient documentation

## 2020-01-24 LAB — COMPREHENSIVE METABOLIC PANEL
ALT: 12 U/L (ref 0–44)
AST: 16 U/L (ref 15–41)
Albumin: 4.7 g/dL (ref 3.5–5.0)
Alkaline Phosphatase: 67 U/L (ref 38–126)
Anion gap: 13 (ref 5–15)
BUN: 7 mg/dL (ref 6–20)
CO2: 25 mmol/L (ref 22–32)
Calcium: 9.4 mg/dL (ref 8.9–10.3)
Chloride: 104 mmol/L (ref 98–111)
Creatinine, Ser: 0.78 mg/dL (ref 0.44–1.00)
GFR calc Af Amer: >60 mL/min (ref >60)
GFR calc non Af Amer: >60 mL/min (ref >60)
Glucose, Bld: 112 mg/dL — ABNORMAL HIGH (ref 70–99)
Potassium: 4.1 mmol/L (ref 3.5–5.1)
Sodium: 142 mmol/L (ref 135–145)
Total Bilirubin: 0.7 mg/dL (ref 0.3–1.2)
Total Protein: 8.2 g/dL — ABNORMAL HIGH (ref 6.5–8.1)

## 2020-01-24 LAB — URINALYSIS, ROUTINE W REFLEX MICROSCOPIC
Bilirubin Urine: NEGATIVE
Glucose, UA: NEGATIVE mg/dL
Hgb urine dipstick: NEGATIVE
Ketones, ur: 5 mg/dL — AB
Leukocytes,Ua: NEGATIVE
Nitrite: NEGATIVE
Protein, ur: NEGATIVE mg/dL
Specific Gravity, Urine: 1.018 (ref 1.005–1.030)
pH: 8 (ref 5.0–8.0)

## 2020-01-24 LAB — I-STAT BETA HCG BLOOD, ED (MC, WL, AP ONLY): I-stat hCG, quantitative: <5 m[IU]/mL (ref <5)

## 2020-01-24 LAB — CBC
HCT: 44.1 % (ref 36.0–46.0)
Hemoglobin: 14.5 g/dL (ref 12.0–15.0)
MCH: 30.4 pg (ref 26.0–34.0)
MCHC: 32.9 g/dL (ref 30.0–36.0)
MCV: 92.5 fL (ref 80.0–100.0)
Platelets: 287 10*3/uL (ref 150–400)
RBC: 4.77 MIL/uL (ref 3.87–5.11)
RDW: 13.9 % (ref 11.5–15.5)
WBC: 13.1 10*3/uL — ABNORMAL HIGH (ref 4.0–10.5)
nRBC: 0 % (ref 0.0–0.2)

## 2020-01-24 LAB — LIPASE, BLOOD: Lipase: 30 U/L (ref 11–51)

## 2020-01-24 NOTE — ED Triage Notes (Signed)
Patient here from home reporting abd pain that started last night. N/v that started this morning. Was able to have a bowel movement this morning.

## 2020-01-24 NOTE — ED Notes (Signed)
I called patient for vital sign recheck no one responded °

## 2020-01-25 ENCOUNTER — Inpatient Hospital Stay (HOSPITAL_COMMUNITY)
Admission: EM | Admit: 2020-01-25 | Discharge: 2020-01-29 | DRG: 392 | Disposition: A | Discharge status: Home / Self Care | Payer: Medicaid Other | Attending: Internal Medicine | Admitting: Internal Medicine

## 2020-01-25 ENCOUNTER — Encounter (HOSPITAL_COMMUNITY): Payer: Self-pay

## 2020-01-25 ENCOUNTER — Other Ambulatory Visit: Payer: Self-pay

## 2020-01-25 DIAGNOSIS — Z981 Arthrodesis status: Secondary | ICD-10-CM

## 2020-01-25 DIAGNOSIS — K5720 Diverticulitis of large intestine with perforation and abscess without bleeding: Principal | ICD-10-CM | POA: Diagnosis present

## 2020-01-25 DIAGNOSIS — G4730 Sleep apnea, unspecified: Secondary | ICD-10-CM | POA: Diagnosis present

## 2020-01-25 DIAGNOSIS — F17210 Nicotine dependence, cigarettes, uncomplicated: Secondary | ICD-10-CM | POA: Diagnosis present

## 2020-01-25 DIAGNOSIS — Z888 Allergy status to other drugs, medicaments and biological substances status: Secondary | ICD-10-CM

## 2020-01-25 DIAGNOSIS — Z8616 Personal history of COVID-19: Secondary | ICD-10-CM

## 2020-01-25 DIAGNOSIS — K644 Residual hemorrhoidal skin tags: Secondary | ICD-10-CM | POA: Diagnosis present

## 2020-01-25 DIAGNOSIS — Z20822 Contact with and (suspected) exposure to covid-19: Secondary | ICD-10-CM | POA: Diagnosis present

## 2020-01-25 DIAGNOSIS — F209 Schizophrenia, unspecified: Secondary | ICD-10-CM | POA: Diagnosis present

## 2020-01-25 DIAGNOSIS — F1210 Cannabis abuse, uncomplicated: Secondary | ICD-10-CM | POA: Diagnosis present

## 2020-01-25 DIAGNOSIS — Z6833 Body mass index (BMI) 33.0-33.9, adult: Secondary | ICD-10-CM

## 2020-01-25 DIAGNOSIS — E669 Obesity, unspecified: Secondary | ICD-10-CM | POA: Diagnosis present

## 2020-01-25 DIAGNOSIS — K5909 Other constipation: Secondary | ICD-10-CM | POA: Diagnosis present

## 2020-01-25 DIAGNOSIS — R188 Other ascites: Secondary | ICD-10-CM | POA: Diagnosis present

## 2020-01-25 DIAGNOSIS — Z79899 Other long term (current) drug therapy: Secondary | ICD-10-CM

## 2020-01-25 DIAGNOSIS — Z87440 Personal history of urinary (tract) infections: Secondary | ICD-10-CM

## 2020-01-25 DIAGNOSIS — J449 Chronic obstructive pulmonary disease, unspecified: Secondary | ICD-10-CM | POA: Diagnosis present

## 2020-01-25 DIAGNOSIS — K5732 Diverticulitis of large intestine without perforation or abscess without bleeding: Secondary | ICD-10-CM | POA: Diagnosis present

## 2020-01-25 DIAGNOSIS — F319 Bipolar disorder, unspecified: Secondary | ICD-10-CM | POA: Diagnosis present

## 2020-01-25 DIAGNOSIS — F909 Attention-deficit hyperactivity disorder, unspecified type: Secondary | ICD-10-CM | POA: Diagnosis present

## 2020-01-25 LAB — CBC
HCT: 42.3 % (ref 36.0–46.0)
Hemoglobin: 14.3 g/dL (ref 12.0–15.0)
MCH: 30.7 pg (ref 26.0–34.0)
MCHC: 33.8 g/dL (ref 30.0–36.0)
MCV: 90.8 fL (ref 80.0–100.0)
Platelets: 311 10*3/uL (ref 150–400)
RBC: 4.66 MIL/uL (ref 3.87–5.11)
RDW: 13.8 % (ref 11.5–15.5)
WBC: 14.9 10*3/uL — ABNORMAL HIGH (ref 4.0–10.5)
nRBC: 0 % (ref 0.0–0.2)

## 2020-01-25 LAB — URINALYSIS, ROUTINE W REFLEX MICROSCOPIC
Bilirubin Urine: NEGATIVE
Glucose, UA: NEGATIVE mg/dL
Ketones, ur: 20 mg/dL — AB
Leukocytes,Ua: NEGATIVE
Nitrite: NEGATIVE
Protein, ur: NEGATIVE mg/dL
Specific Gravity, Urine: 1.019 (ref 1.005–1.030)
pH: 5 (ref 5.0–8.0)

## 2020-01-25 LAB — COMPREHENSIVE METABOLIC PANEL
ALT: 12 U/L (ref 0–44)
AST: 15 U/L (ref 15–41)
Albumin: 4.5 g/dL (ref 3.5–5.0)
Alkaline Phosphatase: 63 U/L (ref 38–126)
Anion gap: 12 (ref 5–15)
BUN: 10 mg/dL (ref 6–20)
CO2: 25 mmol/L (ref 22–32)
Calcium: 9.4 mg/dL (ref 8.9–10.3)
Chloride: 102 mmol/L (ref 98–111)
Creatinine, Ser: 0.79 mg/dL (ref 0.44–1.00)
GFR calc Af Amer: >60 mL/min (ref >60)
GFR calc non Af Amer: >60 mL/min (ref >60)
Glucose, Bld: 109 mg/dL — ABNORMAL HIGH (ref 70–99)
Potassium: 3.5 mmol/L (ref 3.5–5.1)
Sodium: 139 mmol/L (ref 135–145)
Total Bilirubin: 0.8 mg/dL (ref 0.3–1.2)
Total Protein: 8 g/dL (ref 6.5–8.1)

## 2020-01-25 LAB — I-STAT BETA HCG BLOOD, ED (MC, WL, AP ONLY): I-stat hCG, quantitative: <5 m[IU]/mL (ref <5)

## 2020-01-25 LAB — LIPASE, BLOOD: Lipase: 29 U/L (ref 11–51)

## 2020-01-25 MED ORDER — FENTANYL CITRATE (PF) 100 MCG/2ML IJ SOLN
50.0000 ug | Freq: Once | INTRAMUSCULAR | Status: AC
  Administered 2020-01-25: 50 ug via INTRAVENOUS
  Filled 2020-01-25: qty 2

## 2020-01-25 MED ORDER — ONDANSETRON HCL 4 MG/2ML IJ SOLN
4.0000 mg | Freq: Once | INTRAMUSCULAR | Status: AC
  Administered 2020-01-25: 4 mg via INTRAVENOUS
  Filled 2020-01-25: qty 2

## 2020-01-25 NOTE — ED Triage Notes (Signed)
Patient arrived with complaints of lower right sided abdominal pain over the last four days. Reports began vomiting today. States she has some bright red blood in her stools.

## 2020-01-25 NOTE — ED Provider Notes (Signed)
[facility]-EMERGENCY DEPT Provider Note   CSN: [id number] Arrival date & time: 01/25/20  1903     History Chief Complaint  Patient presents with  . Abdominal Pain    Beverly Wong is a 39 y.o. female.  The history is provided by the patient and medical records.  Abdominal Pain Associated symptoms: nausea and vomiting     39 y.o. F with hx of bipolar disorder, headaches, schizophrenia, history of endometriosis status post ablation, presenting to the ED for lower abdominal pain.  States she has had lower abdominal pain for 4 days now, has been steadily worsening.  Reports she came to the ED yesterday but left due to wait time.  States she has intense pain all across her lower abdomen and towards her buttocks.  States it is dull, cramping.  She has had associated N/V.  She has had trouble with BM, able to have a small one yesterday.  States she has noticed some bright red blood and mucous with BM which has never happened before.  No fever, chills, sweats.  Denies urinary symptoms, pelvic pain, vaginal discharge, or irregular bleeding.  No meds PTA.  Past Medical History:  Diagnosis Date  . Bipolar disorder (HCC)   . Headache   . Schizophrenia (HCC)   . Shortness of breath dyspnea    feels short of breath at times even when seated  . UTI (lower urinary tract infection)     Patient Active Problem List   Diagnosis Date Noted  . Abnormal uterine bleeding (AUB) 03/04/2018  . Sleep apnea 02/01/2016  . COPD exacerbation (HCC) 02/01/2016  . Opioid use disorder, mild, on maintenance therapy, abuse (HCC) 02/01/2016  . Snorings 02/01/2016  . Cervical radiculopathy 06/21/2015    Past Surgical History:  Procedure Laterality Date  . ANTERIOR CERVICAL DECOMP/DISCECTOMY FUSION N/A 06/21/2015   Procedure: Cervical six- seven Anterior Cervical Decompression/ Diskectomy/ Fusion;  Surgeon: Loura Halt Ditty, MD;  Location: [facility];  Service: Neurosurgery;   Laterality: N/A;  C6-7 Anterior cervical decompression/diskectomy/fusion  . DILITATION & CURRETTAGE/HYSTROSCOPY WITH NOVASURE ABLATION N/A 03/04/2018   Procedure: DILATATION & CURETTAGE/HYSTEROSCOPY WITH NOVASURE ABLATION;  Surgeon: Conan Bowens, MD;  Location: [facility];  Service: Gynecology;  Laterality: N/A;  . TUBAL LIGATION  2009     OB History    Gravida  3   Para  1   Term  1   Preterm      AB  2   Living  1     SAB  1   TAB  1   Ectopic      Multiple      Live Births  1           Family History  Problem Relation Age of Onset  . Diabetes Mother   . Kidney cancer Father   . Breast cancer Maternal Aunt     Social History   Tobacco Use  . Smoking status: Current Every Day Smoker    Packs/day: 1.00    Years: 17.00    Pack years: 17.00    Types: Cigarettes  . Smokeless tobacco: Never Used  Vaping Use  . Vaping Use: Never used  Substance Use Topics  . Alcohol use: Yes    Comment: on ocaasion.   . Drug use: Yes    Types: Cocaine, Marijuana    Comment: uses cocaine occasionally, smokes marajuana QD.    Home Medications Prior to Admission medications   Medication Sig Start  Date End Date Taking? Authorizing Provider  acetaminophen (TYLENOL) 500 MG tablet Take 1 tablet (500 mg total) by mouth every 6 (six) hours as needed. 03/04/18   Conan Bowens, MD  albuterol (VENTOLIN HFA) 108 (90 Base) MCG/ACT inhaler Inhale 2 puffs into the lungs every 4 (four) hours as needed for wheezing or shortness of breath (cough, shortness of breath or wheezing.). 12/22/19   Eustace Moore, MD  amoxicillin (AMOXIL) 875 MG tablet Take 1 tablet (875 mg total) by mouth 2 (two) times daily. 12/22/19   Eustace Moore, MD  ARIPiprazole ER (ABILIFY MAINTENA) 400 MG PRSY prefilled syringe Inject 400 mg into the muscle every 28 (twenty-eight) days.    [provider]  cyclobenzaprine (FLEXERIL) 10 MG tablet Take 1 tablet (10 mg total) by mouth 2 (two) times daily as  needed for muscle spasms. 10/06/19   Myra Rude, MD  fluconazole (DIFLUCAN) 150 MG tablet Take 1 tablet (150 mg total) by mouth daily. Repeat in 1 week if needed 12/22/19   Eustace Moore, MD  fluticasone [facility]) 50 MCG/ACT nasal spray Place 1-2 sprays into both nostrils daily. 12/22/19   Eustace Moore, MD    Allergies    Ibuprofen  Review of Systems   Review of Systems  Gastrointestinal: Positive for abdominal pain, nausea and vomiting.  All other systems reviewed and are negative.   Physical Exam Updated Vital Signs BP 125/85   Pulse 74   Temp 98.2 F (36.8 C) (Oral)   Resp 18   SpO2 97%   Physical Exam Vitals and nursing note reviewed.  Constitutional:      Appearance: She is well-developed.  HENT:     Head: Normocephalic and atraumatic.  Eyes:     Conjunctiva/sclera: Conjunctivae normal.     Pupils: Pupils are equal, round, and reactive to light.  Cardiovascular:     Rate and Rhythm: Normal rate and regular rhythm.     Heart sounds: Normal heart sounds.  Pulmonary:     Effort: Pulmonary effort is normal.     Breath sounds: Normal breath sounds.  Abdominal:     General: Bowel sounds are normal.     Palpations: Abdomen is soft.     Tenderness: There is abdominal tenderness in the right lower quadrant and left lower quadrant. There is no guarding or rebound.     Comments: Endorses abdominal pain across lower abdomen, mild discomfort noted with palpation but peritoneal signs  Musculoskeletal:        General: Normal range of motion.     Cervical back: Normal range of motion.  Skin:    General: Skin is warm and dry.  Neurological:     Mental Status: She is alert and oriented to person, place, and time.     ED Results / Procedures / Treatments   Labs (all labs ordered are listed, but only abnormal results are displayed) Labs Reviewed  COMPREHENSIVE METABOLIC PANEL - Abnormal; Notable for the following components:      Result Value   Glucose, Bld  109 (*)    All other components within normal limits  CBC - Abnormal; Notable for the following components:   WBC 14.9 (*)    All other components within normal limits  URINALYSIS, ROUTINE W REFLEX MICROSCOPIC - Abnormal; Notable for the following components:   Hgb urine dipstick SMALL (*)    Ketones, ur 20 (*)    Bacteria, UA MANY (*)    All other components  within normal limits  SARS CORONAVIRUS 2 BY RT PCR (HOSPITAL ORDER, PERFORMED IN [facility])  LIPASE, BLOOD  RAPID URINE DRUG SCREEN, HOSP PERFORMED  I-STAT BETA HCG BLOOD, ED ([facility], [facility], [facility] ONLY)    EKG None  Radiology CT ABDOMEN PELVIS W CONTRAST  Result Date: 01/26/2020 CLINICAL DATA:  Abdomen pain with bloody stool EXAM: CT ABDOMEN AND PELVIS WITH CONTRAST TECHNIQUE: Multidetector CT imaging of the abdomen and pelvis was performed using the standard protocol following bolus administration of intravenous contrast. CONTRAST:  100mL OMNIPAQUE IOHEXOL 300 MG/ML  SOLN COMPARISON:  None. FINDINGS: Lower chest: No acute abnormality. Hepatobiliary: No focal liver abnormality is seen. No gallstones, gallbladder wall thickening, or biliary dilatation. Pancreas: Unremarkable. No pancreatic ductal dilatation or surrounding inflammatory changes. Spleen: Normal in size without focal abnormality. Adrenals/Urinary Tract: Adrenal glands are unremarkable. Kidneys are normal, without renal calculi, focal lesion, or hydronephrosis. Bladder is unremarkable. Stomach/Bowel: The stomach is nonenlarged. No dilated small bowel. Negative appendix. Transverse and left colon diverticular disease. Focal wall thickening with moderate inflammatory changes at the sigmoid colon consistent with acute diverticulitis. Small suspected extraluminal gas bubbles adjacent to the inflamed sigmoid colon, series 2, image number 71 concerning for contained perforation. Vascular/Lymphatic: No significant vascular findings are present. No enlarged abdominal or pelvic  lymph nodes. Reproductive: Uterus unremarkable. Bilateral adnexal clips. Simple appearing right adnexal cyst measuring 3 cm. Other: 3.8 cm loculated fluid collection in the posterior pelvis without internal gas or strong rim enhancement at this time. Musculoskeletal: No acute or significant osseous findings. IMPRESSION: 1. Findings consistent with acute sigmoid colon diverticulitis. Small extraluminal gas bubbles adjacent to the inflamed sigmoid colon, concerning for contained perforation. 2. 3.8 cm loculated fluid collection in the posterior pelvis without internal gas or strong rim enhancement to suggest organized abscess at this time. 3. Critical Value/emergent results were called by telephone at the time of interpretation on 01/26/2020 at 1:21 am to provider [facility] Yanett Conkright , who verbally acknowledged these results. Electronically Signed   By: Jasmine PangKim  Fujinaga M.D.   On: 01/26/2020 01:21    Procedures Procedures (including critical care time)  CRITICAL CARE Performed by: Garlon HatchetLisa M Kollyns Mickelson   Total critical care time: 45 minutes  Critical care time was exclusive of separately billable procedures and treating other patients.  Critical care was necessary to treat or prevent imminent or life-threatening deterioration.  Critical care was time spent personally by me on the following activities: development of treatment plan with patient and/or surrogate as well as nursing, discussions with consultants, evaluation of patient's response to treatment, examination of patient, obtaining history from patient or surrogate, ordering and performing treatments and interventions, ordering and review of laboratory studies, ordering and review of radiographic studies, pulse oximetry and re-evaluation of patient's condition.   Medications Ordered in ED Medications  Ampicillin-Sulbactam (UNASYN) 3 g in sodium chloride 0.9 % 100 mL IVPB (has no administration in time range)  HYDROmorphone (DILAUDID) injection 1 mg (has no  administration in time range)  dextrose 5 %-0.45 % sodium chloride infusion (has no administration in time range)  fentaNYL (SUBLIMAZE) injection 50 mcg (50 mcg Intravenous Given 01/25/20 2245)  ondansetron (ZOFRAN) injection 4 mg (4 mg Intravenous Given 01/25/20 2246)  iohexol (OMNIPAQUE) 300 MG/ML solution 100 mL (100 mLs Intravenous Contrast Given 01/26/20 0030)  piperacillin-tazobactam (ZOSYN) IVPB 3.375 g (0 g Intravenous Stopped 01/26/20 0237)  HYDROmorphone (DILAUDID) injection 1 mg (1 mg Intravenous Given 01/26/20 0159)  sodium chloride 0.9 % bolus 1,000 mL (  0 mLs Intravenous Stopped 01/26/20 0237)  ondansetron (ZOFRAN) injection 4 mg (4 mg Intravenous Given 01/26/20 0207)    ED Course  I have reviewed the triage vital signs and the nursing notes.  Pertinent labs & imaging results that were available during my care of the patient were reviewed by me and considered in my medical decision making (see chart for details).    MDM Rules/Calculators/A&P  39 y.o. F here with 4 days of lower abdominal pain.  Came to ED yesterday but left due to wait time.  She is afebrile and nontoxic.  Does have some tenderness along the lower abdomen but no peritoneal signs.  Labs were obtained from triage, does have leukocytosis.  UA with many bacteria, however no leuks or nitrites and patient is asymptomatic.  Will culture.  Plan for CTAP w/contrast for further eval.  Pain and nausea meds ordered.  Will reassess.  CT with findings of diverticulitis with contained perforation.  Does appear to be a small loculated fluid collection, may represent developing abscess.  Will start IV Zosyn and obtain Covid swab.  Will discuss with general surgery for recommendations.  1:32 AM Discussed with general surgery-- will evaluate in AM, agrees with IV abx for now.  Requested medical admission.  Discussed with hospitalist, Dr. Earney Mallet-- will admit for ongoing care.  Final Clinical Impression(s) / ED Diagnoses Final  diagnoses:  Diverticulitis of large intestine with perforation without bleeding    Rx / DC Orders ED Discharge Orders    None       Garlon Hatchet, PA-C 01/26/20 0254    Gerhard Munch, MD 01/28/20 [phone]

## 2020-01-26 ENCOUNTER — Other Ambulatory Visit: Payer: Self-pay

## 2020-01-26 ENCOUNTER — Emergency Department (HOSPITAL_COMMUNITY): Payer: Medicaid Other

## 2020-01-26 ENCOUNTER — Encounter (HOSPITAL_COMMUNITY): Payer: Self-pay

## 2020-01-26 DIAGNOSIS — Z6833 Body mass index (BMI) 33.0-33.9, adult: Secondary | ICD-10-CM | POA: Diagnosis not present

## 2020-01-26 DIAGNOSIS — Z981 Arthrodesis status: Secondary | ICD-10-CM | POA: Diagnosis not present

## 2020-01-26 DIAGNOSIS — F17210 Nicotine dependence, cigarettes, uncomplicated: Secondary | ICD-10-CM | POA: Diagnosis present

## 2020-01-26 DIAGNOSIS — K5720 Diverticulitis of large intestine with perforation and abscess without bleeding: Principal | ICD-10-CM

## 2020-01-26 DIAGNOSIS — Z888 Allergy status to other drugs, medicaments and biological substances status: Secondary | ICD-10-CM | POA: Diagnosis not present

## 2020-01-26 DIAGNOSIS — F909 Attention-deficit hyperactivity disorder, unspecified type: Secondary | ICD-10-CM | POA: Diagnosis present

## 2020-01-26 DIAGNOSIS — Z79899 Other long term (current) drug therapy: Secondary | ICD-10-CM | POA: Diagnosis not present

## 2020-01-26 DIAGNOSIS — F1210 Cannabis abuse, uncomplicated: Secondary | ICD-10-CM | POA: Diagnosis present

## 2020-01-26 DIAGNOSIS — E669 Obesity, unspecified: Secondary | ICD-10-CM | POA: Diagnosis present

## 2020-01-26 DIAGNOSIS — Z87440 Personal history of urinary (tract) infections: Secondary | ICD-10-CM | POA: Diagnosis not present

## 2020-01-26 DIAGNOSIS — K5732 Diverticulitis of large intestine without perforation or abscess without bleeding: Secondary | ICD-10-CM | POA: Diagnosis not present

## 2020-01-26 DIAGNOSIS — R188 Other ascites: Secondary | ICD-10-CM | POA: Diagnosis present

## 2020-01-26 DIAGNOSIS — Z20822 Contact with and (suspected) exposure to covid-19: Secondary | ICD-10-CM | POA: Diagnosis present

## 2020-01-26 DIAGNOSIS — J449 Chronic obstructive pulmonary disease, unspecified: Secondary | ICD-10-CM | POA: Diagnosis present

## 2020-01-26 DIAGNOSIS — K5909 Other constipation: Secondary | ICD-10-CM | POA: Diagnosis present

## 2020-01-26 DIAGNOSIS — G4730 Sleep apnea, unspecified: Secondary | ICD-10-CM | POA: Diagnosis present

## 2020-01-26 DIAGNOSIS — Z8616 Personal history of COVID-19: Secondary | ICD-10-CM | POA: Diagnosis not present

## 2020-01-26 DIAGNOSIS — F319 Bipolar disorder, unspecified: Secondary | ICD-10-CM | POA: Diagnosis present

## 2020-01-26 DIAGNOSIS — F209 Schizophrenia, unspecified: Secondary | ICD-10-CM | POA: Diagnosis present

## 2020-01-26 DIAGNOSIS — K644 Residual hemorrhoidal skin tags: Secondary | ICD-10-CM | POA: Diagnosis present

## 2020-01-26 LAB — HIV ANTIBODY (ROUTINE TESTING W REFLEX): HIV Screen 4th Generation wRfx: NONREACTIVE

## 2020-01-26 LAB — RAPID URINE DRUG SCREEN, HOSP PERFORMED
Amphetamines: NOT DETECTED
Barbiturates: NOT DETECTED
Benzodiazepines: NOT DETECTED
Cocaine: NOT DETECTED
Opiates: NOT DETECTED
Tetrahydrocannabinol: POSITIVE — AB

## 2020-01-26 LAB — SARS CORONAVIRUS 2 BY RT PCR (HOSPITAL ORDER, PERFORMED IN ~~LOC~~ HOSPITAL LAB): SARS Coronavirus 2: NEGATIVE

## 2020-01-26 MED ORDER — ACETAMINOPHEN 500 MG PO TABS
500.0000 mg | ORAL_TABLET | Freq: Four times a day (QID) | ORAL | Status: DC | PRN
  Administered 2020-01-26: 500 mg via ORAL
  Filled 2020-01-26: qty 1

## 2020-01-26 MED ORDER — ONDANSETRON HCL 4 MG/2ML IJ SOLN
4.0000 mg | Freq: Once | INTRAMUSCULAR | Status: AC
  Administered 2020-01-26: 4 mg via INTRAVENOUS
  Filled 2020-01-26: qty 2

## 2020-01-26 MED ORDER — ONDANSETRON HCL 4 MG/2ML IJ SOLN
4.0000 mg | Freq: Four times a day (QID) | INTRAMUSCULAR | Status: DC | PRN
  Administered 2020-01-26 – 2020-01-27 (×4): 4 mg via INTRAVENOUS
  Filled 2020-01-26 (×4): qty 2

## 2020-01-26 MED ORDER — IOHEXOL 300 MG/ML  SOLN
100.0000 mL | Freq: Once | INTRAMUSCULAR | Status: AC | PRN
  Administered 2020-01-26: 100 mL via INTRAVENOUS

## 2020-01-26 MED ORDER — ENOXAPARIN SODIUM 40 MG/0.4ML ~~LOC~~ SOLN
40.0000 mg | SUBCUTANEOUS | Status: DC
  Administered 2020-01-26 – 2020-01-29 (×4): 40 mg via SUBCUTANEOUS
  Filled 2020-01-26 (×4): qty 0.4

## 2020-01-26 MED ORDER — SODIUM CHLORIDE 0.9 % IV SOLN
3.0000 g | Freq: Four times a day (QID) | INTRAVENOUS | Status: DC
  Administered 2020-01-26 – 2020-01-29 (×12): 3 g via INTRAVENOUS
  Filled 2020-01-26: qty 3
  Filled 2020-01-26: qty 8
  Filled 2020-01-26: qty 3
  Filled 2020-01-26: qty 8
  Filled 2020-01-26 (×2): qty 3
  Filled 2020-01-26: qty 8
  Filled 2020-01-26: qty 1.2
  Filled 2020-01-26 (×5): qty 8

## 2020-01-26 MED ORDER — HYDROMORPHONE HCL 1 MG/ML IJ SOLN
1.0000 mg | INTRAMUSCULAR | Status: DC | PRN
  Administered 2020-01-26: 1 mg via INTRAVENOUS
  Filled 2020-01-26: qty 1

## 2020-01-26 MED ORDER — IPRATROPIUM BROMIDE 0.02 % IN SOLN: 0.5000 mg | Freq: Four times a day (QID) | RESPIRATORY_TRACT | Status: DC | PRN

## 2020-01-26 MED ORDER — OXYCODONE HCL 5 MG PO TABS
5.0000 mg | ORAL_TABLET | ORAL | Status: DC | PRN
  Administered 2020-01-26 – 2020-01-28 (×7): 5 mg via ORAL
  Filled 2020-01-26 (×8): qty 1

## 2020-01-26 MED ORDER — DEXTROSE-NACL 5-0.45 % IV SOLN: INTRAVENOUS | Status: DC

## 2020-01-26 MED ORDER — HYDROMORPHONE HCL 1 MG/ML IJ SOLN
0.5000 mg | INTRAMUSCULAR | Status: DC | PRN
  Administered 2020-01-26 – 2020-01-28 (×3): 0.5 mg via INTRAVENOUS
  Filled 2020-01-26 (×3): qty 0.5

## 2020-01-26 MED ORDER — ONDANSETRON HCL 4 MG PO TABS
4.0000 mg | ORAL_TABLET | Freq: Four times a day (QID) | ORAL | Status: DC | PRN
  Administered 2020-01-28: 4 mg via ORAL
  Filled 2020-01-26: qty 1

## 2020-01-26 MED ORDER — PIPERACILLIN-TAZOBACTAM 3.375 G IVPB 30 MIN
3.3750 g | Freq: Once | INTRAVENOUS | Status: AC
  Administered 2020-01-26: 3.375 g via INTRAVENOUS
  Filled 2020-01-26: qty 50

## 2020-01-26 MED ORDER — SODIUM CHLORIDE 0.9 % IV BOLUS
1000.0000 mL | Freq: Once | INTRAVENOUS | Status: AC
  Administered 2020-01-26: 1000 mL via INTRAVENOUS

## 2020-01-26 MED ORDER — HYDROMORPHONE HCL 1 MG/ML IJ SOLN
1.0000 mg | Freq: Once | INTRAMUSCULAR | Status: AC
  Administered 2020-01-26: 1 mg via INTRAVENOUS
  Filled 2020-01-26: qty 1

## 2020-01-26 MED ORDER — KCL IN DEXTROSE-NACL 20-5-0.9 MEQ/L-%-% IV SOLN
INTRAVENOUS | Status: DC
  Filled 2020-01-26 (×7): qty 1000

## 2020-01-26 NOTE — Plan of Care (Signed)
  Problem: Education: Goal: Knowledge of General Education information will improve Description: Including pain rating scale, medication(s)/side effects and non-pharmacologic comfort measures Outcome: Completed/Met   Problem: Activity: Goal: Risk for activity intolerance will decrease Outcome: Completed/Met   

## 2020-01-26 NOTE — H&P (Deleted)
[facility] Admission Note  Beverly Wong Feb 19, 1981  [id number].    Requesting MD: Leodis Binet Chief Complaint: abdominal pain Reason for Consult: diverticulitis  HPI:  Pt is a 39 y/o woman who was seen on 01/24/20, with abdominal pain and discharged.  Pt returned 01/25/20, with 4 days of abdominal pain, and vomiting, she also had some blood in her stools.  She has a hx of bipolar disorder, headaches, shcizophrenia, endometriosis with post ablation.    Work up on 01/25/20:Tm 100.2, HR 90's, other VSS.  CMP was normal except a glucose of 109.  WBC 14.9, H/H 14.3/42.3, HCG <5, urine 0-5 WBC, Drug screen positive for THC. COVID positive 1 month ago, and negative 02:11 this AM.  CT scan shows acute sigmoid colon diverticulitis, with small extraluminal gas bubbles adjacent to the inflamed sigmoid colon concerning for perforation.  3.8 cm loculated fluid collection in the pelvis without gas or strong rim enhancement possible organized abscess.  Pt started on Unasyn and Lovenox.  WE are ask to see.    ROS: Review of Systems  Constitutional: Negative.   HENT: Negative.   Respiratory: Negative.        COVID positive 12/22/19  Cardiovascular: Negative.   Gastrointestinal: Positive for abdominal pain, blood in stool (yesterday AM, mucus/blood), constipation (chronic constipation with loose stools following on and off), diarrhea, nausea and vomiting. Negative for heartburn.  Genitourinary: Negative.   Musculoskeletal: Negative.   Neurological: Negative.   Endo/Heme/Allergies: Negative.   Psychiatric/Behavioral: Positive for substance abuse (daily THC use).       + Bipolar/schizophrenia on injectable Abilify followed by [facility]    Family History  Problem Relation Age of Onset  . Diabetes Mother   . Kidney cancer Father   . Breast cancer Maternal Aunt     Past Medical History:  Diagnosis Date  . Bipolar disorder (HCC)   . Headache   . Schizophrenia (HCC)   . Shortness of breath  dyspnea    feels short of breath at times even when seated  . UTI (lower urinary tract infection)     Past Surgical History:  Procedure Laterality Date  . ANTERIOR CERVICAL DECOMP/DISCECTOMY FUSION N/A 06/21/2015   Procedure: Cervical six- seven Anterior Cervical Decompression/ Diskectomy/ Fusion;  Surgeon: Loura Halt Ditty, MD;  Location: [facility];  Service: Neurosurgery;  Laterality: N/A;  C6-7 Anterior cervical decompression/diskectomy/fusion  . DILITATION & CURRETTAGE/HYSTROSCOPY WITH NOVASURE ABLATION N/A 03/04/2018   Procedure: DILATATION & CURETTAGE/HYSTEROSCOPY WITH NOVASURE ABLATION;  Surgeon: Conan Bowens, MD;  Location: [facility];  Service: Gynecology;  Laterality: N/A;  . TUBAL LIGATION  2009    Social History:  reports that she has been smoking cigarettes. She has a 17.00 pack-year smoking history. She has never used smokeless tobacco. She reports current alcohol use. She reports current drug use. Drugs: Cocaine and Marijuana.  Allergies:  Allergies  Allergen Reactions  . Ibuprofen Other (See Comments)    Can't take it with the Abilify she is on    Prior to Admission medications   Medication Sig Start Date End Date Taking? Authorizing Provider  albuterol (VENTOLIN HFA) 108 (90 Base) MCG/ACT inhaler Inhale 2 puffs into the lungs every 4 (four) hours as needed for wheezing or shortness of breath (cough, shortness of breath or wheezing.). 12/22/19  Yes Eustace Moore, MD  ARIPiprazole ER (ABILIFY MAINTENA) 400 MG PRSY prefilled syringe Inject 400 mg into the muscle every 28 (twenty-eight) days.   Yes [provider]  fluticasone (FLONASE) 50 MCG/ACT nasal spray Place 1-2 sprays into both nostrils daily. 12/22/19  Yes Eustace MooreNelson, Yvonne Sue, MD  acetaminophen (TYLENOL) 500 MG tablet Take 1 tablet (500 mg total) by mouth every 6 (six) hours as needed. 03/04/18   Conan Bowensavis, Kelly M, MD  amoxicillin (AMOXIL) 875 MG tablet Take 1 tablet (875 mg total) by mouth 2 (two) times  daily. 12/22/19   Eustace MooreNelson, Yvonne Sue, MD  cyclobenzaprine (FLEXERIL) 10 MG tablet Take 1 tablet (10 mg total) by mouth 2 (two) times daily as needed for muscle spasms. 10/06/19   Myra RudeSchmitz, Jeremy E, MD  fluconazole (DIFLUCAN) 150 MG tablet Take 1 tablet (150 mg total) by mouth daily. Repeat in 1 week if needed 12/22/19   Eustace MooreNelson, Yvonne Sue, MD     Blood pressure 114/61, pulse 95, temperature 100.2 F (37.9 C), temperature source Oral, resp. rate 15, height 5\' 8"  (1.727 m), weight 99.8 kg, SpO2 96 %. Physical Exam:  General: pleasant, WD, WN AA female who is laying in bed in NAD HEENT: head is normocephalic, atraumatic.  Sclera are noninjected.  Pupils are equal.  Ears and nose without any masses or lesions.  Mouth is pink and moist Heart: regular, rate, and rhythm.  Normal s1,s2. No obvious murmurs, gallops, or rubs noted.  Palpable radial and pedal pulses bilaterally Lungs: CTAB, no wheezes, rhonchi, or rales noted.  Respiratory effort nonlabored Abd: soft, tender across the whole lower abdomen below the umbilicus, she says it is tender with release after palpation.  It is better than when she came in, no masses, hernias, or organomegaly MS: all 4 extremities are symmetrical with no cyanosis, clubbing, or edema. Skin: warm and dry with no masses, lesions, or rashes Neuro: Cranial nerves 2-12 grossly intact, sensation is normal throughout Psych: A&Ox3 with an appropriate affect.   Results for orders placed or performed during the hospital encounter of 01/25/20 (from the past 48 hour(s))  Lipase, blood     Status: None   Collection Time: 01/25/20  7:28 PM  Result Value Ref Range   Lipase 29 11 - 51 U/L    Comment: Performed at [facility], [address] [place], [place] [address]  Comprehensive metabolic panel     Status: Abnormal   Collection Time: 01/25/20  7:28 PM  Result Value Ref Range   Sodium 139 135 - 145 mmol/L   Potassium 3.5 3.5 - 5.1 mmol/L   Chloride 102 98 -  111 mmol/L   CO2 25 22 - 32 mmol/L   Glucose, Bld 109 (H) 70 - 99 mg/dL    Comment: Glucose reference range applies only to samples taken after fasting for at least 8 hours.   BUN 10 6 - 20 mg/dL   Creatinine, Ser 6.040.79 0.44 - 1.00 mg/dL   Calcium 9.4 8.9 - 54.010.3 mg/dL   Total Protein 8.0 6.5 - 8.1 g/dL   Albumin 4.5 3.5 - 5.0 g/dL   AST 15 15 - 41 U/L   ALT 12 0 - 44 U/L   Alkaline Phosphatase 63 38 - 126 U/L   Total Bilirubin 0.8 0.3 - 1.2 mg/dL   GFR calc non Af Amer >60 >60 mL/min   GFR calc Af Amer >60 >60 mL/min   Anion gap 12 5 - 15    Comment: Performed at [facility], [address] [place], [place] [address]  CBC     Status: Abnormal   Collection Time: 01/25/20  7:28 PM  Result Value Ref Range   WBC 14.9 (H) 4.0 - 10.5 K/uL   RBC 4.66 3.87 - 5.11 MIL/uL   Hemoglobin 14.3 12.0 - 15.0 g/dL   HCT 86.7 36 - 46 %   MCV 90.8 80.0 - 100.0 fL   MCH 30.7 26.0 - 34.0 pg   MCHC 33.8 30.0 - 36.0 g/dL   RDW 67.2 09.4 - 70.9 %   Platelets 311 150 - 400 K/uL   nRBC 0.0 0.0 - 0.2 %    Comment: Performed at [facility], [address] [place], [place] [address]  Urinalysis, Routine w reflex microscopic Urine, Clean Catch     Status: Abnormal   Collection Time: 01/25/20  7:36 PM  Result Value Ref Range   Color, Urine YELLOW YELLOW   APPearance CLEAR CLEAR   Specific Gravity, Urine 1.019 1.005 - 1.030   pH 5.0 5.0 - 8.0   Glucose, UA NEGATIVE NEGATIVE mg/dL   Hgb urine dipstick SMALL (A) NEGATIVE   Bilirubin Urine NEGATIVE NEGATIVE   Ketones, ur 20 (A) NEGATIVE mg/dL   Protein, ur NEGATIVE NEGATIVE mg/dL   Nitrite NEGATIVE NEGATIVE   Leukocytes,Ua NEGATIVE NEGATIVE   RBC / HPF 0-5 0 - 5 RBC/hpf   WBC, UA 0-5 0 - 5 WBC/hpf   Bacteria, UA MANY (A) NONE SEEN   Squamous Epithelial / LPF 0-5 0 - 5   Mucus PRESENT     Comment: Performed at [facility], [address] [place], [place] [address]  I-Stat beta hCG blood, ED      Status: None   Collection Time: 01/25/20  7:43 PM  Result Value Ref Range   I-stat hCG, quantitative <5.0 <5 mIU/mL   Comment 3            Comment:   GEST. AGE      CONC.  (mIU/mL)   <=1 WEEK        5 - 50     2 WEEKS       50 - 500     3 WEEKS       100 - 10,000     4 WEEKS     1,000 - 30,000        FEMALE AND NON-PREGNANT FEMALE:     LESS THAN 5 mIU/mL   SARS Coronavirus 2 by RT PCR (hospital order, performed in [facility] hospital lab) Nasopharyngeal Nasopharyngeal Swab     Status: None   Collection Time: 01/26/20  2:11 AM   Specimen: Nasopharyngeal Swab  Result Value Ref Range   SARS Coronavirus 2 NEGATIVE NEGATIVE    Comment: (NOTE) SARS-CoV-2 target nucleic acids are NOT DETECTED.  The SARS-CoV-2 RNA is generally detectable in upper and lower respiratory specimens during the acute phase of infection. The lowest concentration of SARS-CoV-2 viral copies this assay can detect is 250 copies / mL. A negative result does not preclude SARS-CoV-2 infection and should not be used as the sole basis for treatment or other patient management decisions.  A negative result may occur with improper specimen collection / handling, submission of specimen other than nasopharyngeal swab, presence of viral mutation(s) within the areas targeted by this assay, and inadequate number of viral copies (<250 copies / mL). A negative result must be combined with clinical observations, patient history, and epidemiological information.  Fact Sheet for Patients:   BoilerBrush.com.cy  Fact Sheet for Healthcare Providers: https://pope.com/  This test is not yet approved or  cleared  by the [place] and has been authorized for detection and/or diagnosis of SARS-CoV-2 by FDA under an Emergency Use Authorization (EUA).  This EUA will remain in effect (meaning this test can be used) for the duration of the COVID-19 declaration under Section  564(b)(1) of the Act, 21 U.S.C. section 360bbb-3(b)(1), unless the authorization is terminated or revoked sooner.  Performed at [facility], [address] [place], [place] [address]   Urine rapid drug screen (hosp performed)     Status: Abnormal   Collection Time: 01/26/20  2:30 AM  Result Value Ref Range   Opiates NONE DETECTED NONE DETECTED   Cocaine NONE DETECTED NONE DETECTED   Benzodiazepines NONE DETECTED NONE DETECTED   Amphetamines NONE DETECTED NONE DETECTED   Tetrahydrocannabinol POSITIVE (A) NONE DETECTED   Barbiturates NONE DETECTED NONE DETECTED    Comment: (NOTE) DRUG SCREEN FOR MEDICAL PURPOSES ONLY.  IF CONFIRMATION IS NEEDED FOR ANY PURPOSE, NOTIFY LAB WITHIN 5 DAYS.  LOWEST DETECTABLE LIMITS FOR URINE DRUG SCREEN Drug Class                     Cutoff (ng/mL) Amphetamine and metabolites    1000 Barbiturate and metabolites    200 Benzodiazepine                 200 Tricyclics and metabolites     300 Opiates and metabolites        300 Cocaine and metabolites        300 THC                            50 Performed at [facility], [address], [place], [place] [address]    CT ABDOMEN PELVIS W CONTRAST  Result Date: 01/26/2020 CLINICAL DATA:  Abdomen pain with bloody stool EXAM: CT ABDOMEN AND PELVIS WITH CONTRAST TECHNIQUE: Multidetector CT imaging of the abdomen and pelvis was performed using the standard protocol following bolus administration of intravenous contrast. CONTRAST:  OMNIPAQUE IOHEXOL 300 MG/ML  SOLN COMPARISON:  None. FINDINGS: Lower chest: No acute abnormality. Hepatobiliary: No focal liver abnormality is seen. No gallstones, gallbladder wall thickening, or biliary dilatation. Pancreas: Unremarkable. No pancreatic ductal dilatation or surrounding inflammatory changes. Spleen: Normal in size without focal abnormality. Adrenals/Urinary Tract: Adrenal glands are unremarkable. Kidneys are normal, without  renal calculi, focal lesion, or hydronephrosis. Bladder is unremarkable. Stomach/Bowel: The stomach is nonenlarged. No dilated small bowel. Negative appendix. Transverse and left colon diverticular disease. Focal wall thickening with moderate inflammatory changes at the sigmoid colon consistent with acute diverticulitis. Small suspected extraluminal gas bubbles adjacent to the inflamed sigmoid colon, series 2, image number 71 concerning for contained perforation. Vascular/Lymphatic: No significant vascular findings are present. No enlarged abdominal or pelvic lymph nodes. Reproductive: Uterus unremarkable. Bilateral adnexal clips. Simple appearing right adnexal cyst measuring 3 cm. Other: 3.8 cm loculated fluid collection in the posterior pelvis without internal gas or strong rim enhancement at this time. Musculoskeletal: No acute or significant osseous findings. IMPRESSION: 1. Findings consistent with acute sigmoid colon diverticulitis. Small extraluminal gas bubbles adjacent to the inflamed sigmoid colon, concerning for contained perforation. 2. 3.8 cm loculated fluid collection in the posterior pelvis without internal gas or strong rim enhancement to suggest organized abscess at this time. 3. Critical Value/emergent results were called by telephone at the time of interpretation on 01/26/2020 at 1:21 am to provider [facility] ,  who verbally acknowledged these results. Electronically Signed   By: Jasmine Pang M.D.   On: 01/26/2020 01:21   . ampicillin-sulbactam (UNASYN) IV    . dextrose 5 % and 0.9 % NaCl with KCl 20 mEq/L    . dextrose 5 % and 0.45% NaCl 100 mL/hr at 01/26/20 0553      Assessment/Plan Chronic bronchitis Tobacco use/THC use Hx bipolar disorder/schizophrenia -on Abilify Hx Covid Positive 8/9/202/Negative 01/26/20 BMI 33.45  Perforated acute sigmoid diverticulitis with 3.8 cm loculated fluid collection  FEN:NPO/IV fluids ID: Zosyn 9/13 x 1; Unasyn 9/13 >> starting later this  AM DVT:  Lovenox to start this AM Follow up:  TBD  Plan: Patient been admitted by Medicine.  At this time primarily plan will be to treat her medically.  This is her first documented case of diverticulitis.  If this resolves she will need a colonoscopy in 6 to 8 weeks and follow-up, as an outpatient surgical consult.  She has been started on 1 dose of Zosyn and has Unasyn to follow-up.  Keep her n.p.o. for now.  Rehydrate with IV fluids.  Ice chips and sips for now.  We will follow with you.  [facility] 01/26/2020, 6:55 AM Please see Amion for pager number during day hours 7:00am-4:30pm

## 2020-01-26 NOTE — Progress Notes (Signed)
[facility] Progress Note  Patient: Beverly Wong    ZOX:[id number]  DOA: 01/25/2020     Date of Service: the patient was seen and examined on 01/26/2020  Brief hospital course: Past medical history of chronic bronchitis, bipolar disorder, endometriosis SP ablation.  Presents with diverticulitis with microperforation. Currently plan is conservative care with IV biotics.  Assessment and Plan: 1.  Acute sigmoid diverticulitis with contained perforation. Appreciate general surgery consultation. Continue bowel rest. NPO. IV Unasyn. Pain control.  IV fluids. Patient will need outpatient colonoscopy.  2.  Chronic bronchitis Continue albuterol  3.  Bipolar disorder Continue Abilify  4.  Cannabis abuse Monitor  5.  Obesity Body mass index is 33.45 kg/m.  Patient will benefit from dietary consultation outpatient.  Diet: N.p.o. DVT Prophylaxis:   enoxaparin (LOVENOX) injection 40 mg Start: 01/26/20 1000    Advance goals of care discussion: Full code  Family Communication: no family was present at bedside, at the time of interview.   Disposition:  Status is: Inpatient  Remains inpatient appropriate because:IV treatments appropriate due to intensity of illness or inability to take PO   Dispo: The patient is from: Home              Anticipated d/c is to: Home              Anticipated d/c date is: > 3 days              Patient currently is not medically stable to d/c.        Subjective: No nausea no vomiting.  No fever no chills.  No chest pain.  Continues to have some abdominal pain.  Primarily worsening with passing gas.  No BM so far.  Physical Exam:  General: Appear in mild distress, no Rash; Oral Mucosa Clear, moist. no Abnormal Neck Mass Or lumps, Conjunctiva normal  Cardiovascular: S1 and S2 Present, no Murmur, Respiratory: good respiratory effort, Bilateral Air entry present and CTA, no Crackles, no wheezes Abdomen: Bowel Sound present, Soft and mild  tenderness Extremities: no Pedal edema Neurology: alert and oriented to time, place, and person affect appropriate. no new focal deficit Gait not checked due to patient safety concerns  Vitals:   01/26/20 0835 01/26/20 0855 01/26/20 1501 01/26/20 1851  BP: 103/61 117/68 116/69 123/89  Pulse: 79 76 66 73  Resp:  18 18 19   Temp:  98.2 F (36.8 C) 99 F (37.2 C) 98.5 F (36.9 C)  TempSrc:  Oral Oral Oral  SpO2: 97% 96% 97% 98%  Weight:      Height:        Intake/Output Summary (Last 24 hours) at 01/26/2020 2016 Last data filed at 01/26/2020 1800 Gross per 24 hour  Intake 1210.13 ml  Output --  Net 1210.13 ml   Filed Weights   01/26/20 0532  Weight: 99.8 kg    Data Reviewed: I have personally reviewed and interpreted daily labs, tele strips, imagings as discussed above. I reviewed all nursing notes, pharmacy notes, vitals, pertinent old records I have discussed plan of care as described above with RN and patient/family.  CBC: Recent Labs  Lab 01/24/20 1138 01/25/20 1928  WBC 13.1* 14.9*  HGB 14.5 14.3  HCT 44.1 42.3  MCV 92.5 90.8  PLT 287 311   Basic Metabolic Panel: Recent Labs  Lab 01/24/20 1138 01/25/20 1928  NA 142 139  K 4.1 3.5  CL 104 102  CO2 25 25  GLUCOSE 112* 109*  BUN 7 10  CREATININE 0.78 0.79  CALCIUM 9.4 9.4    Studies: CT ABDOMEN PELVIS W CONTRAST  Result Date: 01/26/2020 CLINICAL DATA:  Abdomen pain with bloody stool EXAM: CT ABDOMEN AND PELVIS WITH CONTRAST TECHNIQUE: Multidetector CT imaging of the abdomen and pelvis was performed using the standard protocol following bolus administration of intravenous contrast. CONTRAST:  OMNIPAQUE IOHEXOL 300 MG/ML  SOLN COMPARISON:  None. FINDINGS: Lower chest: No acute abnormality. Hepatobiliary: No focal liver abnormality is seen. No gallstones, gallbladder wall thickening, or biliary dilatation. Pancreas: Unremarkable. No pancreatic ductal dilatation or surrounding inflammatory changes.  Spleen: Normal in size without focal abnormality. Adrenals/Urinary Tract: Adrenal glands are unremarkable. Kidneys are normal, without renal calculi, focal lesion, or hydronephrosis. Bladder is unremarkable. Stomach/Bowel: The stomach is nonenlarged. No dilated small bowel. Negative appendix. Transverse and left colon diverticular disease. Focal wall thickening with moderate inflammatory changes at the sigmoid colon consistent with acute diverticulitis. Small suspected extraluminal gas bubbles adjacent to the inflamed sigmoid colon, series 2, image number 71 concerning for contained perforation. Vascular/Lymphatic: No significant vascular findings are present. No enlarged abdominal or pelvic lymph nodes. Reproductive: Uterus unremarkable. Bilateral adnexal clips. Simple appearing right adnexal cyst measuring 3 cm. Other: 3.8 cm loculated fluid collection in the posterior pelvis without internal gas or strong rim enhancement at this time. Musculoskeletal: No acute or significant osseous findings. IMPRESSION: 1. Findings consistent with acute sigmoid colon diverticulitis. Small extraluminal gas bubbles adjacent to the inflamed sigmoid colon, concerning for contained perforation. 2. 3.8 cm loculated fluid collection in the posterior pelvis without internal gas or strong rim enhancement to suggest organized abscess at this time. 3. Critical Value/emergent results were called by telephone at the time of interpretation on 01/26/2020 at 1:21 am to provider [facility] , who verbally acknowledged these results. Electronically Signed   By: Jasmine Pang M.D.   On: 01/26/2020 01:21    Scheduled Meds: . enoxaparin (LOVENOX) injection  40 mg Subcutaneous Q24H   Continuous Infusions: . ampicillin-sulbactam (UNASYN) IV 3 g (01/26/20 1406)  . dextrose 5 % and 0.9 % NaCl with KCl 20 mEq/L 100 mL/hr at 01/26/20 1147   PRN Meds: acetaminophen, HYDROmorphone (DILAUDID) injection, ipratropium, ondansetron **OR** ondansetron  (ZOFRAN) IV, oxyCODONE  Time spent: 35 minutes  Author: Lynden Oxford, MD [facility] 01/26/2020 8:16 PM  To reach On-call, see care teams to locate the attending and reach out via [email or web address]. Between 7PM-7AM, please contact night-coverage If you still have difficulty reaching the attending provider, please page the [facility] (Director on Call) for [facility] on amion for assistance.

## 2020-01-26 NOTE — H&P (Signed)
History and Physical    Beverly Wong XVQ:[id number] DOB: 1981/02/25 DOA: 01/25/2020  PCP: Fleet Contras, MD    I have personally briefly reviewed patient's old medical records in [facility]  Chief Complaint: Abdominal pain, Nausea and vomiting    HPI: Beverly Wong is a 39 y.o. female with chronic bronchitis, bipolar disorder, history of endometriosis status post ablation, presented to the ED for lower abdominal pain X 4 days.     She reports lower abdominal sharp/cramping discomfort that is 10/10 and radiates bilaterally towards her buttocks.  It is intermittent without any alleviating or aggravating factors.  It is associated with nausea and at least 4 episodes of nonbilious nonbloody vomiting over the course of past 4 days.  She also reports mucus and bright red blood in stools.  Admits to chills however denies any fever.  He presented to ED with similar complaints on 911 however left due to long waiting times.  History of similar episodes in the past that appear to be precipitated by eating beef.  She denies urinary symptoms, pelvic pain, vaginal discharge, or irregular bleeding.   Personal social history: She is single with 1 daughter.  Active smoker 0.5 pack/day for the past 20 years.  Occasional drinking.  Smokes marijuana.  History of prior cocaine use with last used years ago.  Family history: Denies any history of diverticulosis/colorectal cancer.  No history of CAD.   ED Course: Blood pressure 123/66, pulse 94, RR 16, SPO2 97% on room air.  T-max 98.2.  Labs with normal LFTs and unremarkable BMP however WBC 15 K, Hb 14, platelets 311.  CT abdomen/pelvis with acute sigmoid diverticulitis with contained perforation.  Also noted was a 3.8 cm loculated fluid effusion in the posterior pelvis without internal gas or rim enhancement to suggest abscess.    Review of Systems: As per HPI otherwise 10 point review of systems negative.  Review of Systems   Constitutional: Positive for chills and malaise/fatigue. Negative for diaphoresis, fever and weight loss.  HENT: Negative.   Eyes: Negative.   Respiratory: Positive for cough, shortness of breath and wheezing. Negative for hemoptysis and sputum production.   Cardiovascular: Negative.   Gastrointestinal: Positive for abdominal pain, blood in stool, nausea and vomiting. Negative for constipation, diarrhea and melena.  Genitourinary: Negative.   Musculoskeletal: Negative.   Skin: Negative for itching and rash.  Neurological: Negative.   Endo/Heme/Allergies: Negative.   Psychiatric/Behavioral: Positive for depression. Negative for hallucinations, memory loss, substance abuse and suicidal ideas. The patient is nervous/anxious. The patient does not have insomnia.     Past Medical History:  Diagnosis Date  . Bipolar disorder (HCC)   . Headache   . Schizophrenia (HCC)   . Shortness of breath dyspnea    feels short of breath at times even when seated  . UTI (lower urinary tract infection)     Past Surgical History:  Procedure Laterality Date  . ANTERIOR CERVICAL DECOMP/DISCECTOMY FUSION N/A 06/21/2015   Procedure: Cervical six- seven Anterior Cervical Decompression/ Diskectomy/ Fusion;  Surgeon: Loura Halt Ditty, MD;  Location: [facility];  Service: Neurosurgery;  Laterality: N/A;  C6-7 Anterior cervical decompression/diskectomy/fusion  . DILITATION & CURRETTAGE/HYSTROSCOPY WITH NOVASURE ABLATION N/A 03/04/2018   Procedure: DILATATION & CURETTAGE/HYSTEROSCOPY WITH NOVASURE ABLATION;  Surgeon: Conan Bowens, MD;  Location: [facility];  Service: Gynecology;  Laterality: N/A;  . TUBAL LIGATION  2009     reports that she has been smoking cigarettes. She has a 17.00  pack-year smoking history. She has never used smokeless tobacco. She reports current alcohol use. She reports current drug use. Drugs: Cocaine and Marijuana.  Allergies  Allergen Reactions  . Ibuprofen Other (See Comments)     Can't take it with the Abilify she is on    Family History  Problem Relation Age of Onset  . Diabetes Mother   . Kidney cancer Father   . Breast cancer Maternal Aunt      Prior to Admission medications   Medication Sig Start Date End Date Taking? Authorizing Provider  acetaminophen (TYLENOL) 500 MG tablet Take 1 tablet (500 mg total) by mouth every 6 (six) hours as needed. 03/04/18   Conan Bowensavis, Kelly M, MD  albuterol (VENTOLIN HFA) 108 (90 Base) MCG/ACT inhaler Inhale 2 puffs into the lungs every 4 (four) hours as needed for wheezing or shortness of breath (cough, shortness of breath or wheezing.). 12/22/19   Eustace MooreNelson, Yvonne Sue, MD  amoxicillin (AMOXIL) 875 MG tablet Take 1 tablet (875 mg total) by mouth 2 (two) times daily. 12/22/19   Eustace MooreNelson, Yvonne Sue, MD  ARIPiprazole ER (ABILIFY MAINTENA) 400 MG PRSY prefilled syringe Inject 400 mg into the muscle every 28 (twenty-eight) days.    [provider]  cyclobenzaprine (FLEXERIL) 10 MG tablet Take 1 tablet (10 mg total) by mouth 2 (two) times daily as needed for muscle spasms. 10/06/19   Myra RudeSchmitz, Jeremy E, MD  fluconazole (DIFLUCAN) 150 MG tablet Take 1 tablet (150 mg total) by mouth daily. Repeat in 1 week if needed 12/22/19   Eustace MooreNelson, Yvonne Sue, MD  fluticasone [facility](FLONASE) 50 MCG/ACT nasal spray Place 1-2 sprays into both nostrils daily. 12/22/19   Eustace MooreNelson, Yvonne Sue, MD    Physical Exam: Vitals:   01/25/20 1913 01/25/20 2218 01/26/20 0209  BP: 135/80 125/85 123/66  Pulse: 71 74 94  Resp: 17 18 16   Temp: 98.2 F (36.8 C)    TempSrc: Oral    SpO2: 98% 97% 97%    Constitutional: NAD, calm, comfortable Vitals:   01/25/20 1913 01/25/20 2218 01/26/20 0209  BP: 135/80 125/85 123/66  Pulse: 71 74 94  Resp: 17 18 16   Temp: 98.2 F (36.8 C)    TempSrc: Oral    SpO2: 98% 97% 97%   Eyes: PERRL, lids and conjunctivae normal ENMT: Mucous membranes are moist. Posterior pharynx clear of any exudate or lesions.Normal dentition.  Neck:  normal, supple, no masses, no thyromegaly Respiratory: clear to auscultation bilaterally, no wheezing, no crackles. Normal respiratory effort. No accessory muscle use.  Cardiovascular: Regular rate and rhythm, no murmurs / rubs / gallops. No extremity edema. 2+ pedal pulses. No carotid bruits.  Abdomen: Bowel sounds positive, tenderness to palpation in RLQ and LLQ with guarding and rebound.  Rovsing sign positive.  Musculoskeletal: no clubbing / cyanosis. No joint deformity upper and lower extremities. Good ROM, no contractures. Normal muscle tone.  Skin: no rashes, lesions, ulcers. No induration Neurologic: CN 2-12 grossly intact. Sensation intact, DTR normal. Strength 5/5 in all 4.  Psychiatric: Normal judgment and insight. Alert and oriented x 3. Normal mood.   Labs on Admission: I have personally reviewed following labs and imaging studies  CBC: Recent Labs  Lab 01/24/20 1138 01/25/20 1928  WBC 13.1* 14.9*  HGB 14.5 14.3  HCT 44.1 42.3  MCV 92.5 90.8  PLT 287 311   Basic Metabolic Panel: Recent Labs  Lab 01/24/20 1138 01/25/20 1928  NA 142 139  K 4.1 3.5  CL 104 102  CO2 25 25  GLUCOSE 112* 109*  BUN 7 10  CREATININE 0.78 0.79  CALCIUM 9.4 9.4   GFR: CrCl cannot be calculated (Unknown ideal weight.). Liver Function Tests: Recent Labs  Lab 01/24/20 1138 01/25/20 1928  AST 16 15  ALT 12 12  ALKPHOS 67 63  BILITOT 0.7 0.8  PROT 8.2* 8.0  ALBUMIN 4.7 4.5   Recent Labs  Lab 01/24/20 1138 01/25/20 1928  LIPASE 30 29   No results for input(s): AMMONIA in the last 168 hours. Coagulation Profile: No results for input(s): INR, PROTIME in the last 168 hours. Cardiac Enzymes: No results for input(s): CKTOTAL, CKMB, CKMBINDEX, TROPONINI in the last 168 hours. BNP (last 3 results) No results for input(s): PROBNP in the last 8760 hours. HbA1C: No results for input(s): HGBA1C in the last 72 hours. CBG: No results for input(s): GLUCAP in the last 168  hours. Lipid Profile: No results for input(s): CHOL, HDL, LDLCALC, TRIG, CHOLHDL, LDLDIRECT in the last 72 hours. Thyroid Function Tests: No results for input(s): TSH, T4TOTAL, FREET4, T3FREE, THYROIDAB in the last 72 hours. Anemia Panel: No results for input(s): VITAMINB12, FOLATE, FERRITIN, TIBC, IRON, RETICCTPCT in the last 72 hours. Urine analysis:    Component Value Date/Time   COLORURINE YELLOW 01/25/2020 1936   APPEARANCEUR CLEAR 01/25/2020 1936   LABSPEC 1.019 01/25/2020 1936   PHURINE 5.0 01/25/2020 1936   GLUCOSEU NEGATIVE 01/25/2020 1936   HGBUR SMALL (A) 01/25/2020 1936   BILIRUBINUR NEGATIVE 01/25/2020 1936   KETONESUR 20 (A) 01/25/2020 1936   PROTEINUR NEGATIVE 01/25/2020 1936   UROBILINOGEN 0.2 12/01/2014 1215   NITRITE NEGATIVE 01/25/2020 1936   LEUKOCYTESUR NEGATIVE 01/25/2020 1936    Radiological Exams on Admission: CT ABDOMEN PELVIS W CONTRAST  Result Date: 01/26/2020 CLINICAL DATA:  Abdomen pain with bloody stool EXAM: CT ABDOMEN AND PELVIS WITH CONTRAST TECHNIQUE: Multidetector CT imaging of the abdomen and pelvis was performed using the standard protocol following bolus administration of intravenous contrast. CONTRAST:  OMNIPAQUE IOHEXOL 300 MG/ML  SOLN COMPARISON:  None. FINDINGS: Lower chest: No acute abnormality. Hepatobiliary: No focal liver abnormality is seen. No gallstones, gallbladder wall thickening, or biliary dilatation. Pancreas: Unremarkable. No pancreatic ductal dilatation or surrounding inflammatory changes. Spleen: Normal in size without focal abnormality. Adrenals/Urinary Tract: Adrenal glands are unremarkable. Kidneys are normal, without renal calculi, focal lesion, or hydronephrosis. Bladder is unremarkable. Stomach/Bowel: The stomach is nonenlarged. No dilated small bowel. Negative appendix. Transverse and left colon diverticular disease. Focal wall thickening with moderate inflammatory changes at the sigmoid colon consistent with acute  diverticulitis. Small suspected extraluminal gas bubbles adjacent to the inflamed sigmoid colon, series 2, image number 71 concerning for contained perforation. Vascular/Lymphatic: No significant vascular findings are present. No enlarged abdominal or pelvic lymph nodes. Reproductive: Uterus unremarkable. Bilateral adnexal clips. Simple appearing right adnexal cyst measuring 3 cm. Other: 3.8 cm loculated fluid collection in the posterior pelvis without internal gas or strong rim enhancement at this time. Musculoskeletal: No acute or significant osseous findings. IMPRESSION: 1. Findings consistent with acute sigmoid colon diverticulitis. Small extraluminal gas bubbles adjacent to the inflamed sigmoid colon, concerning for contained perforation. 2. 3.8 cm loculated fluid collection in the posterior pelvis without internal gas or strong rim enhancement to suggest organized abscess at this time. 3. Critical Value/emergent results were called by telephone at the time of interpretation on 01/26/2020 at 1:21 am to provider [facility] , who verbally acknowledged these results. Electronically Signed   By: Jasmine Pang  M.D.   On: 01/26/2020 01:21    EKG: Not reviewed.  Assessment/Plan Active Problems:   Sigmoid diverticulitis   Intraabdominal fluid collection   COPD with chronic bronchitis (HCC)   Bipolar affective (HCC) #Acute sigmoid diverticulitis with contained perforation. -Bowel rest with IV fluids. -Unasyn -Serial abdominal examinations. -General surgery called from ED.  Awaiting consult.  #Fluid collection in pelvis without evidence of abscess  #Chronic bronchitis -as needed Albuterol.  #Bipolar disorder -Continue with Abilify.  DVT prophylaxis: SubQ heparin   code Status: Full code Family Communication: None   disposition Plan: Home   consults called: General surgery called from ED. Admission status: Inpatient   Trudi Morgenthaler MD [facility]   If 7PM-7AM, please  contact night-coverage [email or web address] Password [email or web address]  01/26/2020, 2:30 AM

## 2020-01-26 NOTE — Plan of Care (Signed)
Problem: Education: Goal: Knowledge of General Education information will improve Description: Including pain rating scale, medication(s)/side effects and non-pharmacologic comfort measures Outcome: Completed/Met   Problem: Clinical Measurements: Goal: Respiratory complications will improve Outcome: Completed/Met   Problem: Activity: Goal: Risk for activity intolerance will decrease Outcome: Completed/Met   Problem: Nutrition: Goal: Adequate nutrition will be maintained Outcome: Completed/Met   Problem: Coping: Goal: Level of anxiety will decrease Outcome: Completed/Met   Problem: Pain Managment: Goal: General experience of comfort will improve Outcome: Completed/Met

## 2020-01-26 NOTE — Consult Note (Addendum)
Date of Service:  01/26/2020  6:55 AM          Cosign Needed      Expand All Collapse All  Show:Clear all [] Manual[] Template[] Copied  Added by: [x] , PA-C  [] Hover for details [facility] Admission Note   Beverly Wong 05-28-1980  [facility].     Requesting MD: Dia Sitter Chief Complaint: abdominal pain Reason for Consult: diverticulitis   HPI:  Pt is a 39 y/o woman who was seen on 01/24/20, with abdominal pain and discharged.  Pt returned 01/25/20, with 4 days of abdominal pain, and vomiting, she also had some blood in her stools.  She has a hx of bipolar disorder, headaches, shcizophrenia, endometriosis with post ablation.     Work up on 01/25/20:Tm 100.2, HR 90's, other VSS.  CMP was normal except a glucose of 109.  WBC 14.9, H/H 14.3/42.3, HCG <5, urine 0-5 WBC, Drug screen positive for THC. COVID positive 1 month ago, and negative 02:11 this AM.  CT scan shows acute sigmoid colon diverticulitis, with small extraluminal gas bubbles adjacent to the inflamed sigmoid colon concerning for perforation.  3.8 cm loculated fluid collection in the pelvis without gas or strong rim enhancement possible organized abscess.  Pt started on Unasyn and Lovenox.  WE are ask to see.     ROS: Review of Systems  Constitutional: Negative.   HENT: Negative.   Respiratory: Negative.        COVID positive 12/22/19  Cardiovascular: Negative.   Gastrointestinal: Positive for abdominal pain, blood in stool (yesterday AM, mucus/blood), constipation (chronic constipation with loose stools following on and off), diarrhea, nausea and vomiting. Negative for heartburn.  Genitourinary: Negative.   Musculoskeletal: Negative.   Neurological: Negative.   Endo/Heme/Allergies: Negative.   Psychiatric/Behavioral: Positive for substance abuse (daily THC use).       + Bipolar/schizophrenia on injectable Abilify followed by 03/26/20           Family History  Problem Relation Age of Onset   . Diabetes Mother    . Kidney cancer Father    . Breast cancer Maternal Aunt            Past Medical History:  Diagnosis Date  . Bipolar disorder (HCC)    . Headache    . Schizophrenia (HCC)    . Shortness of breath dyspnea      feels short of breath at times even when seated  . UTI (lower urinary tract infection)             Past Surgical History:  Procedure Laterality Date  . ANTERIOR CERVICAL DECOMP/DISCECTOMY FUSION N/A 06/21/2015    Procedure: Cervical six- seven Anterior Cervical Decompression/ Diskectomy/ Fusion;  Surgeon: 02/21/20 Ditty, MD;  Location: [facility];  Service: Neurosurgery;  Laterality: N/A;  C6-7 Anterior cervical decompression/diskectomy/fusion  . DILITATION & CURRETTAGE/HYSTROSCOPY WITH NOVASURE ABLATION N/A 03/04/2018    Procedure: DILATATION & CURETTAGE/HYSTEROSCOPY WITH NOVASURE ABLATION;  Surgeon: 08/19/2015, MD;  Location: [facility];  Service: Gynecology;  Laterality: N/A;  . TUBAL LIGATION   2009      Social History:  reports that she has been smoking cigarettes. She has a 17.00 pack-year smoking history. She has never used smokeless tobacco. She reports current alcohol use. She reports current drug use. Drugs: Cocaine and Marijuana.   Allergies:       Allergies  Allergen Reactions  . Ibuprofen Other (See Comments)      Can't take it with  the Abilify she is on             Prior to Admission medications   Medication Sig Start Date End Date Taking? Authorizing Provider  albuterol (VENTOLIN HFA) 108 (90 Base) MCG/ACT inhaler Inhale 2 puffs into the lungs every 4 (four) hours as needed for wheezing or shortness of breath (cough, shortness of breath or wheezing.). 12/22/19   Yes Eustace MooreNelson, Yvonne Sue, MD  ARIPiprazole ER (ABILIFY MAINTENA) 400 MG PRSY prefilled syringe Inject 400 mg into the muscle every 28 (twenty-eight) days.     Yes [provider]  fluticasone (FLONASE) 50 MCG/ACT nasal spray Place 1-2 sprays into both nostrils  daily. 12/22/19   Yes Eustace MooreNelson, Yvonne Sue, MD  acetaminophen (TYLENOL) 500 MG tablet Take 1 tablet (500 mg total) by mouth every 6 (six) hours as needed. 03/04/18     Conan Bowensavis, Kelly M, MD  amoxicillin (AMOXIL) 875 MG tablet Take 1 tablet (875 mg total) by mouth 2 (two) times daily. 12/22/19     Eustace MooreNelson, Yvonne Sue, MD  cyclobenzaprine (FLEXERIL) 10 MG tablet Take 1 tablet (10 mg total) by mouth 2 (two) times daily as needed for muscle spasms. 10/06/19     Myra RudeSchmitz, Jeremy E, MD  fluconazole (DIFLUCAN) 150 MG tablet Take 1 tablet (150 mg total) by mouth daily. Repeat in 1 week if needed 12/22/19     Eustace MooreNelson, Yvonne Sue, MD        Blood pressure 114/61, pulse 95, temperature 100.2 F (37.9 C), temperature source Oral, resp. rate 15, height 5\' 8"  (1.727 m), weight 99.8 kg, SpO2 96 %. Physical Exam:  General: pleasant, WD, WN AA female who is laying in bed in NAD HEENT: head is normocephalic, atraumatic.  Sclera are noninjected.  Pupils are equal.  Ears and nose without any masses or lesions.  Mouth is pink and moist Heart: regular, rate, and rhythm.  Normal s1,s2. No obvious murmurs, gallops, or rubs noted.  Palpable radial and pedal pulses bilaterally Lungs: CTAB, no wheezes, rhonchi, or rales noted.  Respiratory effort nonlabored Abd: soft, tender across the whole lower abdomen below the umbilicus, she says it is tender with release after palpation.  It is better than when she came in, no masses, hernias, or organomegaly MS: all 4 extremities are symmetrical with no cyanosis, clubbing, or edema. Skin: warm and dry with no masses, lesions, or rashes Neuro: Cranial nerves 2-12 grossly intact, sensation is normal throughout Psych: A&Ox3 with an appropriate affect.    Lab Results Last 48 Hours        Results for orders placed or performed during the hospital encounter of 01/25/20 (from the past 48 hour(s))  Lipase, blood     Status: None    Collection Time: 01/25/20  7:28 PM  Result Value Ref Range     Lipase 29 11 - 51 U/L      Comment: Performed at [facility], [address] [place], [place] [id number]  Comprehensive metabolic panel     Status: Abnormal    Collection Time: 01/25/20  7:28 PM  Result Value Ref Range    Sodium 139 135 - 145 mmol/L    Potassium 3.5 3.5 - 5.1 mmol/L    Chloride 102 98 - 111 mmol/L    CO2 25 22 - 32 mmol/L    Glucose, Bld 109 (H) 70 - 99 mg/dL      Comment: Glucose reference range applies only to samples taken after fasting for at least  8 hours.    BUN 10 6 - 20 mg/dL    Creatinine, Ser 4.03 0.44 - 1.00 mg/dL    Calcium 9.4 8.9 - 47.4 mg/dL    Total Protein 8.0 6.5 - 8.1 g/dL    Albumin 4.5 3.5 - 5.0 g/dL    AST 15 15 - 41 U/L    ALT 12 0 - 44 U/L    Alkaline Phosphatase 63 38 - 126 U/L    Total Bilirubin 0.8 0.3 - 1.2 mg/dL    GFR calc non Af Amer >60 >60 mL/min    GFR calc Af Amer >60 >60 mL/min    Anion gap 12 5 - 15      Comment: Performed at [facility], [address] [place], [place] [address]  CBC     Status: Abnormal    Collection Time: 01/25/20  7:28 PM  Result Value Ref Range    WBC 14.9 (H) 4.0 - 10.5 K/uL    RBC 4.66 3.87 - 5.11 MIL/uL    Hemoglobin 14.3 12.0 - 15.0 g/dL    HCT 38.7 36 - 46 %    MCV 90.8 80.0 - 100.0 fL    MCH 30.7 26.0 - 34.0 pg    MCHC 33.8 30.0 - 36.0 g/dL    RDW 56.4 33.2 - 95.1 %    Platelets 311 150 - 400 K/uL    nRBC 0.0 0.0 - 0.2 %      Comment: Performed at [facility], [address], [place], [place] [address]  Urinalysis, Routine w reflex microscopic Urine, Clean Catch     Status: Abnormal    Collection Time: 01/25/20  7:36 PM  Result Value Ref Range    Color, Urine YELLOW YELLOW    APPearance CLEAR CLEAR    Specific Gravity, Urine 1.019 1.005 - 1.030    pH 5.0 5.0 - 8.0    Glucose, UA NEGATIVE NEGATIVE mg/dL    Hgb urine dipstick SMALL (A) NEGATIVE    Bilirubin Urine NEGATIVE NEGATIVE    Ketones, ur 20 (A) NEGATIVE mg/dL    Protein,  ur NEGATIVE NEGATIVE mg/dL    Nitrite NEGATIVE NEGATIVE    Leukocytes,Ua NEGATIVE NEGATIVE    RBC / HPF 0-5 0 - 5 RBC/hpf    WBC, UA 0-5 0 - 5 WBC/hpf    Bacteria, UA MANY (A) NONE SEEN    Squamous Epithelial / LPF 0-5 0 - 5    Mucus PRESENT        Comment: Performed at [facility] [address], [place], [place] [address]  I-Stat beta hCG blood, ED     Status: None    Collection Time: 01/25/20  7:43 PM  Result Value Ref Range    I-stat hCG, quantitative <5.0 <5 mIU/mL    Comment 3               Comment:   GEST. AGE      CONC.  (mIU/mL)   <=1 WEEK        5 - 50     2 WEEKS       50 - 500     3 WEEKS       100 - 10,000     4 WEEKS     1,000 - 30,000        FEMALE AND NON-PREGNANT FEMALE:     LESS THAN 5 mIU/mL    SARS Coronavirus 2 by RT  PCR (hospital order, performed in [facility] hospital lab) Nasopharyngeal Nasopharyngeal Swab     Status: None    Collection Time: 01/26/20  2:11 AM    Specimen: Nasopharyngeal Swab  Result Value Ref Range    SARS Coronavirus 2 NEGATIVE NEGATIVE      Comment: (NOTE) SARS-CoV-2 target nucleic acids are NOT DETECTED.   The SARS-CoV-2 RNA is generally detectable in upper and lower respiratory specimens during the acute phase of infection. The lowest concentration of SARS-CoV-2 viral copies this assay can detect is 250 copies / mL. A negative result does not preclude SARS-CoV-2 infection and should not be used as the sole basis for treatment or other patient management decisions.  A negative result may occur with improper specimen collection / handling, submission of specimen other than nasopharyngeal swab, presence of viral mutation(s) within the areas targeted by this assay, and inadequate number of viral copies (<250 copies / mL). A negative result must be combined with clinical observations, patient history, and epidemiological information.   Fact Sheet for Patients:   BoilerBrush.com.cy   Fact  Sheet for Healthcare Providers: https://pope.com/   This test is not yet approved or  cleared by the [place] FDA and has been authorized for detection and/or diagnosis of SARS-CoV-2 by FDA under an Emergency Use Authorization (EUA).  This EUA will remain in effect (meaning this test can be used) for the duration of the COVID-19 declaration under Section 564(b)(1) of the Act, 21 U.S.C. section 360bbb-3(b)(1), unless the authorization is terminated or revoked sooner.   Performed at [facility], [address] [place], [place] [address]    Urine rapid drug screen (hosp performed)     Status: Abnormal    Collection Time: 01/26/20  2:30 AM  Result Value Ref Range    Opiates NONE DETECTED NONE DETECTED    Cocaine NONE DETECTED NONE DETECTED    Benzodiazepines NONE DETECTED NONE DETECTED    Amphetamines NONE DETECTED NONE DETECTED    Tetrahydrocannabinol POSITIVE (A) NONE DETECTED    Barbiturates NONE DETECTED NONE DETECTED      Comment: (NOTE) DRUG SCREEN FOR MEDICAL PURPOSES ONLY.  IF CONFIRMATION IS NEEDED FOR ANY PURPOSE, NOTIFY LAB WITHIN 5 DAYS.   LOWEST DETECTABLE LIMITS FOR URINE DRUG SCREEN Drug Class                     Cutoff (ng/mL) Amphetamine and metabolites    1000 Barbiturate and metabolites    200 Benzodiazepine                 200 Tricyclics and metabolites     300 Opiates and metabolites        300 Cocaine and metabolites        300 THC                            50 Performed at [facility], [address] [place], [place] [address]         Imaging Results (Last 48 hours)  CT ABDOMEN PELVIS W CONTRAST   Result Date: 01/26/2020 CLINICAL DATA:  Abdomen pain with bloody stool EXAM: CT ABDOMEN AND PELVIS WITH CONTRAST TECHNIQUE: Multidetector CT imaging of the abdomen and pelvis was performed using the standard protocol following bolus administration of intravenous contrast. CONTRAST:   OMNIPAQUE IOHEXOL 300 MG/ML  SOLN COMPARISON:  None. FINDINGS: Lower chest: No acute abnormality. Hepatobiliary: No  focal liver abnormality is seen. No gallstones, gallbladder wall thickening, or biliary dilatation. Pancreas: Unremarkable. No pancreatic ductal dilatation or surrounding inflammatory changes. Spleen: Normal in size without focal abnormality. Adrenals/Urinary Tract: Adrenal glands are unremarkable. Kidneys are normal, without renal calculi, focal lesion, or hydronephrosis. Bladder is unremarkable. Stomach/Bowel: The stomach is nonenlarged. No dilated small bowel. Negative appendix. Transverse and left colon diverticular disease. Focal wall thickening with moderate inflammatory changes at the sigmoid colon consistent with acute diverticulitis. Small suspected extraluminal gas bubbles adjacent to the inflamed sigmoid colon, series 2, image number 71 concerning for contained perforation. Vascular/Lymphatic: No significant vascular findings are present. No enlarged abdominal or pelvic lymph nodes. Reproductive: Uterus unremarkable. Bilateral adnexal clips. Simple appearing right adnexal cyst measuring 3 cm. Other: 3.8 cm loculated fluid collection in the posterior pelvis without internal gas or strong rim enhancement at this time. Musculoskeletal: No acute or significant osseous findings. IMPRESSION: 1. Findings consistent with acute sigmoid colon diverticulitis. Small extraluminal gas bubbles adjacent to the inflamed sigmoid colon, concerning for contained perforation. 2. 3.8 cm loculated fluid collection in the posterior pelvis without internal gas or strong rim enhancement to suggest organized abscess at this time. 3. Critical Value/emergent results were called by telephone at the time of interpretation on 01/26/2020 at 1:21 am to provider [facility] , who verbally acknowledged these results. Electronically Signed   By: Jasmine Pang M.D.   On: 01/26/2020 01:21     . ampicillin-sulbactam  (UNASYN) IV    . dextrose 5 % and 0.9 % NaCl with KCl 20 mEq/L    . dextrose 5 % and 0.45% NaCl 100 mL/hr at 01/26/20 0553          Assessment/Plan Chronic bronchitis Tobacco use/THC use Hx bronchitis Hx bipolar disorder/schizophrenia -on Abilify Hx Covid Positive 8/9/202/Negative 01/26/20 Chronic constipation  BMI 33.45   Perforated acute sigmoid diverticulitis with 3.8 cm loculated fluid collection   FEN:NPO/IV fluids ID: Zosyn 9/13 x 1; Unasyn 9/13 >> starting later this AM DVT:  Lovenox to start this AM Follow up:  TBD   Plan: Patient been admitted by Medicine.  At this time primarily plan will be to treat her medically.  This is her first documented case of diverticulitis.  If this resolves she will need a colonoscopy in 6 to 8 weeks and follow-up, as an outpatient surgical consult.  She has been started on 1 dose of Zosyn and has Unasyn to follow-up.  Keep her n.p.o. for now.  Rehydrate with IV fluids.  Ice chips and sips for now.  We will follow with you.   Sherrie [facility] 01/26/2020, 6:55 AM Please see Amion for pager number during day hours 7:00am-4:30pm

## 2020-01-27 LAB — BASIC METABOLIC PANEL
Anion gap: 10 (ref 5–15)
BUN: 6 mg/dL (ref 6–20)
CO2: 25 mmol/L (ref 22–32)
Calcium: 8.6 mg/dL — ABNORMAL LOW (ref 8.9–10.3)
Chloride: 101 mmol/L (ref 98–111)
Creatinine, Ser: 0.82 mg/dL (ref 0.44–1.00)
GFR calc Af Amer: >60 mL/min (ref >60)
GFR calc non Af Amer: >60 mL/min (ref >60)
Glucose, Bld: 107 mg/dL — ABNORMAL HIGH (ref 70–99)
Potassium: 3.5 mmol/L (ref 3.5–5.1)
Sodium: 136 mmol/L (ref 135–145)

## 2020-01-27 LAB — CBC
HCT: 40 % (ref 36.0–46.0)
Hemoglobin: 13 g/dL (ref 12.0–15.0)
MCH: 30.7 pg (ref 26.0–34.0)
MCHC: 32.5 g/dL (ref 30.0–36.0)
MCV: 94.3 fL (ref 80.0–100.0)
Platelets: 268 10*3/uL (ref 150–400)
RBC: 4.24 MIL/uL (ref 3.87–5.11)
RDW: 13.7 % (ref 11.5–15.5)
WBC: 13.6 10*3/uL — ABNORMAL HIGH (ref 4.0–10.5)
nRBC: 0 % (ref 0.0–0.2)

## 2020-01-27 MED ORDER — POLYETHYLENE GLYCOL 3350 17 G PO PACK
17.0000 g | PACK | Freq: Every day | ORAL | Status: DC
  Administered 2020-01-27 – 2020-01-29 (×3): 17 g via ORAL
  Filled 2020-01-27 (×3): qty 1

## 2020-01-27 MED ORDER — ACETAMINOPHEN 500 MG PO TABS
500.0000 mg | ORAL_TABLET | Freq: Four times a day (QID) | ORAL | Status: DC | PRN
  Administered 2020-01-28: 500 mg via ORAL
  Filled 2020-01-27: qty 1

## 2020-01-27 MED ORDER — LORAZEPAM 0.5 MG PO TABS
0.5000 mg | ORAL_TABLET | Freq: Four times a day (QID) | ORAL | Status: DC | PRN
  Administered 2020-01-27 – 2020-01-29 (×2): 0.5 mg via ORAL
  Filled 2020-01-27 (×2): qty 1

## 2020-01-27 NOTE — Progress Notes (Signed)
Subjective No acute events. Feeling better than yesterday. Pain improved. Denies n/v.  Objective: Vital signs in last 24 hours: Temp:  [98.2 F (36.8 C)-99 F (37.2 C)] 98.6 F (37 C) (09/14 0354) Pulse Rate:  [66-76] 70 (09/14 0613) Resp:  [18-19] 18 (09/14 6568) BP: (111-123)/(63-89) 111/63 (09/14 1275) SpO2:  [96 %-98 %] 96 % (09/14 0613) Last BM Date: 01/26/20  Intake/Output from previous day: 09/13 0701 - 09/14 0700 In: 2209.5 [I.V.:1909.5; IV Piggyback:300] Out: 300 [Urine:300] Intake/Output this shift: No intake/output data recorded.  Gen: NAD, comfortable CV: RRR Pulm: Normal work of breathing Abd: Soft, mildly ttp LLQ. No rebound no guarding. Nondistended Ext: SCDs in place  Lab Results: CBC  Recent Labs    01/25/20 1928 01/27/20 0456  WBC 14.9* 13.6*  HGB 14.3 13.0  HCT 42.3 40.0  PLT 311 268   BMET Recent Labs    01/25/20 1928 01/27/20 0456  NA 139 136  K 3.5 3.5  CL 102 101  CO2 25 25  GLUCOSE 109* 107*  BUN 10 6  CREATININE 0.79 0.82  CALCIUM 9.4 8.6*   PT/INR No results for input(s): LABPROT, INR in the last 72 hours. ABG No results for input(s): PHART, HCO3 in the last 72 hours.  Invalid input(s): PCO2, PO2  Studies/Results:  Anti-infectives: Anti-infectives (From admission, onward)   Start     Dose/Rate Route Frequency Ordered Stop   01/26/20 0800  Ampicillin-Sulbactam (UNASYN) 3 g in sodium chloride 0.9 % 100 mL IVPB        3 g 200 mL/hr over 30 Minutes Intravenous Every 6 hours 01/26/20 0234     01/26/20 0130  piperacillin-tazobactam (ZOSYN) IVPB 3.375 g        3.375 g 100 mL/hr over 30 Minutes Intravenous  Once 01/26/20 0120 01/26/20 0237       Assessment/Plan: Patient Active Problem List   Diagnosis Date Noted  . Sigmoid diverticulitis 01/26/2020  . Intraabdominal fluid collection 01/26/2020  . 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 Bipolar affective (HCC) 01/26/2020  . Abnormal uterine bleeding (AUB)  03/04/2018  . Sleep apnea 02/01/2016  . COPD exacerbation (HCC) 02/01/2016  . Opioid use disorder, mild, on maintenance therapy, abuse (HCC) 02/01/2016  . Snorings 02/01/2016  . Cervical radiculopathy 06/21/2015   Chronic bronchitis Tobacco use/THC use Hx bronchitis Hx bipolar disorder/schizophrenia-on Abilify Hx CovidPositive8/9/202/Negative 01/26/20 Chronic constipation  BMI 33.45  Perforated acute sigmoid diverticulitis with 3.8 cm loculated fluid collection  FEN:Ice chips ok ID: Zosyn 9/13 x 1; Unasyn 9/13 >> DVT: Lovenox to start this AM Follow up: TBD  WBC down-trending; afebrile; stable/improved abdominal exam  Plan: Patient been admitted byMedicine. At this time primarily plan will be to treat her medically. This is her first documented case of diverticulitis. If this resolves she will need a colonoscopy in 6 to 8 weeks and follow-up, as an outpatient surgical consult. Continue IV abx. Ok for ice chips. We will follow with you   LOS: 1 day   Stephanie Coup. Cliffton Asters, M.D. [facility] Use [email or web address] to contact on call provider

## 2020-01-27 NOTE — Progress Notes (Signed)
[facility] Progress Note  Patient: Beverly Wong    ZOX:[id number]  DOA: 01/25/2020     Date of Service: the patient was seen and examined on 01/27/2020  Brief hospital course: Past medical history of chronic bronchitis, bipolar disorder, endometriosis SP ablation.  Presents with diverticulitis with microperforation. Currently plan is continue IV antibiotics.  Assessment and Plan: 1.  Acute sigmoid diverticulitis with contained perforation. Appreciate general surgery consultation. Continue bowel rest. NPO.  Ice chips and sips of water. IV Unasyn. Pain control.  IV fluids. Patient will need outpatient colonoscopy.  2.  Chronic bronchitis Continue albuterol  3.  Bipolar disorder Continue Abilify  4.  Cannabis abuse ADHD Ativan as needed. Monitor  5.  Obesity Patient will benefit from dietary consultation outpatient. Body mass index is 33.45 kg/m.   Diet: N.p.o. DVT Prophylaxis:   enoxaparin (LOVENOX) injection 40 mg Start: 01/26/20 1000    Advance goals of care discussion: Full code  Family Communication: no family was present at bedside, at the time of interview.   Disposition:  Status is: Inpatient  Remains inpatient appropriate because:IV treatments appropriate due to intensity of illness or inability to take PO still n.p.o. for diverticulitis with perforation.   Dispo: The patient is from: Home              Anticipated d/c is to: Home              Anticipated d/c date is: 3 days              Patient currently is not medically stable to d/c.    Subjective: Continues to have abdominal pain. Passing gas but no BM.  No fever no chills.  No nausea no vomiting.  Physical Exam:  General: Appear in mild distress, no Rash; Oral Mucosa Clear, moist. no Abnormal Neck Mass Or lumps, Conjunctiva normal  Cardiovascular: S1 and S2 Present, no Murmur, Respiratory: good respiratory effort, Bilateral Air entry present and CTA, no Crackles, no  wheezes Abdomen: Bowel Sound present, Soft and mild tenderness Extremities: no Pedal edema Neurology: alert and oriented to time, place, and person affect appropriate. no new focal deficit Gait not checked due to patient safety concerns  Vitals:   01/26/20 1501 01/26/20 1851 01/27/20 0613 01/27/20 1356  BP: 116/69 123/89 111/63 116/73  Pulse: 66 73 70 66  Resp: 18 19 18 18   Temp: 99 F (37.2 C) 98.5 F (36.9 C) 98.6 F (37 C) 98.4 F (36.9 C)  TempSrc: Oral Oral Oral Oral  SpO2: 97% 98% 96% 96%  Weight:      Height:        Intake/Output Summary (Last 24 hours) at 01/27/2020 1915 Last data filed at 01/27/2020 1500 Gross per 24 hour  Intake 1746.51 ml  Output 700 ml  Net 1046.51 ml   Filed Weights   01/26/20 0532  Weight: 99.8 kg    Data Reviewed: I have personally reviewed and interpreted daily labs, tele strips, imagings as discussed above. I reviewed all nursing notes, pharmacy notes, vitals, pertinent old records I have discussed plan of care as described above with RN and patient/family.  CBC: Recent Labs  Lab 01/24/20 1138 01/25/20 1928 01/27/20 0456  WBC 13.1* 14.9* 13.6*  HGB 14.5 14.3 13.0  HCT 44.1 42.3 40.0  MCV 92.5 90.8 94.3  PLT 287 311 268   Basic Metabolic Panel: Recent Labs  Lab 01/24/20 1138 01/25/20 1928 01/27/20 0456  NA 142 139 136  K 4.1 3.5  3.5  CL 104 102 101  CO2 25 25 25   GLUCOSE 112* 109* 107*  BUN 7 10 6   CREATININE 0.78 0.79 0.82  CALCIUM 9.4 9.4 8.6*    Studies: No results found.  Scheduled Meds: . enoxaparin (LOVENOX) injection  40 mg Subcutaneous Q24H  . polyethylene glycol  17 g Oral Daily   Continuous Infusions: . ampicillin-sulbactam (UNASYN) IV Stopped (01/27/20 1453)  . dextrose 5 % and 0.9 % NaCl with KCl 20 mEq/L 100 mL/hr at 01/27/20 1500   PRN Meds: acetaminophen, HYDROmorphone (DILAUDID) injection, ipratropium, LORazepam, ondansetron **OR** ondansetron (ZOFRAN) IV, oxyCODONE  Time spent: 35  minutes  Author: 01/29/20, MD [facility] 01/27/2020 7:15 PM  To reach On-call, see care teams to locate the attending and reach out via [email or web address]. Between 7PM-7AM, please contact night-coverage If you still have difficulty reaching the attending provider, please page the [facility] (Director on Call) for [facility] on amion for assistance.

## 2020-01-28 DIAGNOSIS — F319 Bipolar disorder, unspecified: Secondary | ICD-10-CM

## 2020-01-28 LAB — BASIC METABOLIC PANEL
Anion gap: 11 (ref 5–15)
BUN: 5 mg/dL — ABNORMAL LOW (ref 6–20)
CO2: 25 mmol/L (ref 22–32)
Calcium: 8.6 mg/dL — ABNORMAL LOW (ref 8.9–10.3)
Chloride: 103 mmol/L (ref 98–111)
Creatinine, Ser: 0.81 mg/dL (ref 0.44–1.00)
GFR calc Af Amer: >60 mL/min (ref >60)
GFR calc non Af Amer: >60 mL/min (ref >60)
Glucose, Bld: 104 mg/dL — ABNORMAL HIGH (ref 70–99)
Potassium: 3.6 mmol/L (ref 3.5–5.1)
Sodium: 139 mmol/L (ref 135–145)

## 2020-01-28 LAB — CBC
HCT: 36.4 % (ref 36.0–46.0)
Hemoglobin: 12 g/dL (ref 12.0–15.0)
MCH: 30.9 pg (ref 26.0–34.0)
MCHC: 33 g/dL (ref 30.0–36.0)
MCV: 93.8 fL (ref 80.0–100.0)
Platelets: 259 10*3/uL (ref 150–400)
RBC: 3.88 MIL/uL (ref 3.87–5.11)
RDW: 13.6 % (ref 11.5–15.5)
WBC: 12.2 10*3/uL — ABNORMAL HIGH (ref 4.0–10.5)
nRBC: 0 % (ref 0.0–0.2)

## 2020-01-28 MED ORDER — DOCUSATE SODIUM 100 MG PO CAPS
100.0000 mg | ORAL_CAPSULE | Freq: Two times a day (BID) | ORAL | Status: DC
  Administered 2020-01-28 – 2020-01-29 (×3): 100 mg via ORAL
  Filled 2020-01-28 (×3): qty 1

## 2020-01-28 MED ORDER — PHENYLEPHRINE-MINERAL OIL-PET 0.25-14-74.9 % RE OINT
1.0000 "application " | TOPICAL_OINTMENT | Freq: Two times a day (BID) | RECTAL | Status: DC | PRN
  Filled 2020-01-28: qty 57

## 2020-01-28 NOTE — Progress Notes (Signed)
Subjective No acute events. States her abdominal pain is almost completely gone. Having flatus and reports perirectal pain with passing of flatus. Denies BM. Denies urinary sxs. Denies fever or chills.   Objective: Vital signs in last 24 hours: Temp:  [98.4 F (36.9 C)-99.9 F (37.7 C)] 98.6 F (37 C) (09/15 0415) Pulse Rate:  [66-77] 77 (09/15 0415) Resp:  [18-20] 18 (09/15 0415) BP: (116-117)/(68-73) 116/70 (09/15 0415) SpO2:  [94 %-98 %] 94 % (09/15 0415) Last BM Date: 01/26/20  Intake/Output from previous day: 09/14 0701 - 09/15 0700 In: 747.2 [I.V.:547.2; IV Piggyback:200] Out: 400 [Urine:400] Intake/Output this shift: No intake/output data recorded.  Gen: NAD, comfortable CV: RRR Pulm: Normal work of breathing Abd: Soft, nontender, No rebound no guarding. Nondistended No evidence of anal fissure or hemorrhoids on external anal exam.  Ext: SCDs in place  Lab Results: CBC  Recent Labs    01/27/20 0456 01/28/20 0516  WBC 13.6* 12.2*  HGB 13.0 12.0  HCT 40.0 36.4  PLT 268 259   BMET Recent Labs    01/27/20 0456 01/28/20 0516  NA 136 139  K 3.5 3.6  CL 101 103  CO2 25 25  GLUCOSE 107* 104*  BUN 6 5*  CREATININE 0.82 0.81  CALCIUM 8.6* 8.6*   PT/INR No results for input(s): LABPROT, INR in the last 72 hours. ABG No results for input(s): PHART, HCO3 in the last 72 hours.  Invalid input(s): PCO2, PO2  Studies/Results:  Anti-infectives: Anti-infectives (From admission, onward)   Start     Dose/Rate Route Frequency Ordered Stop   01/26/20 0800  Ampicillin-Sulbactam (UNASYN) 3 g in sodium chloride 0.9 % 100 mL IVPB        3 g 200 mL/hr over 30 Minutes Intravenous Every 6 hours 01/26/20 0234     01/26/20 0130  piperacillin-tazobactam (ZOSYN) IVPB 3.375 g        3.375 g 100 mL/hr over 30 Minutes Intravenous  Once 01/26/20 0120 01/26/20 0237       Assessment/Plan: Patient Active Problem List   Diagnosis Date Noted  . Sigmoid diverticulitis  01/26/2020  . Intraabdominal fluid collection 01/26/2020  . 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 Bipolar affective (HCC) 01/26/2020  . Abnormal uterine bleeding (AUB) 03/04/2018  . Sleep apnea 02/01/2016  . COPD exacerbation (HCC) 02/01/2016  . Opioid use disorder, mild, on maintenance therapy, abuse (HCC) 02/01/2016  . Snorings 02/01/2016  . Cervical radiculopathy 06/21/2015   Chronic bronchitis Tobacco use/THC use Hx bronchitis Hx bipolar disorder/schizophrenia-on Abilify Hx CovidPositive8/9/202/Negative 01/26/20 Chronic constipation  BMI 33.45  Perforated acute sigmoid diverticulitis with 3.8 cm loculated fluid collection  FEN:Ice chips ok ID: Zosyn 9/13 x 1; Unasyn 9/13 >> DVT: Lovenox  Follow up: TBD  WBC down-trending but still elevated 12.2 from 13.6; afebrile; stable/improved abdominal exam  Plan: continue IV abx, ok to advance to FLD. This is her first documented case of diverticulitis. If this resolves she will need a colonoscopy in 6 to 8 weeks and follow-up, as an outpatient surgical consult.  We will follow with you   LOS: 2 days   Hosie Spangle. [facility] Use [email or web address] to contact on call provider

## 2020-01-28 NOTE — Progress Notes (Addendum)
PROGRESS NOTE  Beverly Wong MBW:[id number] DOB: 1981-02-23 DOA: 01/25/2020 PCP: Fleet Contras, MD  HPI/Recap of past 24 hours: Past medical history of chronic bronchitis, bipolar disorder, endometriosis SP ablation. Presents with diverticulitis with microperforation. Currently plan is continue IV antibiotics.  Seen by general surgery.  Plan for conservative management.  01/28/20:  Seen and examined.  Reports rectal pain worse when passing flatus.  She has had this for some time and thinks they are due to her hemorrhoids.  Preparation H added.    Assessment/Plan: Active Problems:   Sigmoid diverticulitis   Intraabdominal fluid collection   COPD with chronic bronchitis (HCC)   Bipolar affective (HCC)  Acute sigmoid diverticulitis with contained perforation. Appreciate general surgery consultation. Continue bowel rest. Start full liquid diet as recommended by general surgery Continue unasyn and analgesics Will need outpatient colonoscopy in 6-8 weeks. Leukocytosis is improving  Rectal pain, suspect from external hemorrhoids Denies bright red blood per rectum Start Preparation H ointment PRN  Chronic bronchitis/tobacco use disorder Tobacco cessation counseling done at bedside Continue albuterol O2 st 94% on RA  Bipolar disorder Continue Abilify  Cannabis abuse ADHD Ativan as needed. Monitor  Obesity Patient will benefit from dietary consultation outpatient. Body mass index is 33.45 kg/m.  Recommend weight loss with regular physical activity and healthy dieting.  Diet: Full liquid. DVT Prophylaxis:   enoxaparin (LOVENOX) injection 40 mg Start: 01/26/20 1000    Advance goals of care discussion: Full code  Family Communication: no family was present at bedside, at the time of interview.    Code Status: Full  Family Communication: None at bedside  Disposition Plan:      Status is: Inpatient    Dispo: The patient is from:Home               Anticipated d/c is SV:XBLT              Anticipated d/c date is:01/29/20               Patient currently not stable for dc, ongoing management for diverticulitis.  Advancing diet as tolerated, stRted on full liquid diet today.        Objective: Vitals:   01/27/20 0613 01/27/20 1356 01/27/20 2029 01/28/20 0415  BP: 111/63 116/73 117/68 116/70  Pulse: 70 66 74 77  Resp: 18 18 20 18   Temp: 98.6 F (37 C) 98.4 F (36.9 C) 99.9 F (37.7 C) 98.6 F (37 C)  TempSrc: Oral Oral Oral Oral  SpO2: 96% 96% 98% 94%  Weight:      Height:        Intake/Output Summary (Last 24 hours) at 01/28/2020 1354 Last data filed at 01/28/2020 0920 Gross per 24 hour  Intake 747.19 ml  Output --  Net 747.19 ml   Filed Weights   01/26/20 0532  Weight: 99.8 kg    Exam:  . General: 39 y.o. year-old female well developed well nourished in no acute distress.  Alert and oriented x3. . Cardiovascular: Regular rate and rhythm with no rubs or gallops.  No thyromegaly or JVD noted.   24 Respiratory: Clear to auscultation with no wheezes or rales. Good inspiratory effort. . Abdomen: Soft nontender nondistended with normal bowel sounds x4 quadrants. . Musculoskeletal: No lower extremity edema. 2/4 pulses in all 4 extremities. Marland Kitchen Psychiatry: Mood is appropriate for condition and setting   Data Reviewed: CBC: Recent Labs  Lab 01/24/20 1138 01/25/20 1928 01/27/20 0456 01/28/20 0516  WBC 13.1* 14.9*  13.6* 12.2*  HGB 14.5 14.3 13.0 12.0  HCT 44.1 42.3 40.0 36.4  MCV 92.5 90.8 94.3 93.8  PLT 287 311 268 259   Basic Metabolic Panel: Recent Labs  Lab 01/24/20 1138 01/25/20 1928 01/27/20 0456 01/28/20 0516  NA 142 139 136 139  K 4.1 3.5 3.5 3.6  CL 104 102 101 103  CO2 25 25 25 25   GLUCOSE 112* 109* 107* 104*  BUN 7 10 6  5*  CREATININE 0.78 0.79 0.82 0.81  CALCIUM 9.4 9.4 8.6* 8.6*   GFR: Estimated Creatinine Clearance: 115.3 mL/min (by C-G formula based on SCr of 0.81 mg/dL). Liver Function  Tests: Recent Labs  Lab 01/24/20 1138 01/25/20 1928  AST 16 15  ALT 12 12  ALKPHOS 67 63  BILITOT 0.7 0.8  PROT 8.2* 8.0  ALBUMIN 4.7 4.5   Recent Labs  Lab 01/24/20 1138 01/25/20 1928  LIPASE 30 29   No results for input(s): AMMONIA in the last 168 hours. Coagulation Profile: No results for input(s): INR, PROTIME in the last 168 hours. Cardiac Enzymes: No results for input(s): CKTOTAL, CKMB, CKMBINDEX, TROPONINI in the last 168 hours. BNP (last 3 results) No results for input(s): PROBNP in the last 8760 hours. HbA1C: No results for input(s): HGBA1C in the last 72 hours. CBG: No results for input(s): GLUCAP in the last 168 hours. Lipid Profile: No results for input(s): CHOL, HDL, LDLCALC, TRIG, CHOLHDL, LDLDIRECT in the last 72 hours. Thyroid Function Tests: No results for input(s): TSH, T4TOTAL, FREET4, T3FREE, THYROIDAB in the last 72 hours. Anemia Panel: No results for input(s): VITAMINB12, FOLATE, FERRITIN, TIBC, IRON, RETICCTPCT in the last 72 hours. Urine analysis:    Component Value Date/Time   COLORURINE YELLOW 01/25/2020 1936   APPEARANCEUR CLEAR 01/25/2020 1936   LABSPEC 1.019 01/25/2020 1936   PHURINE 5.0 01/25/2020 1936   GLUCOSEU NEGATIVE 01/25/2020 1936   HGBUR SMALL (A) 01/25/2020 1936   BILIRUBINUR NEGATIVE 01/25/2020 1936   KETONESUR 20 (A) 01/25/2020 1936   PROTEINUR NEGATIVE 01/25/2020 1936   UROBILINOGEN 0.2 12/01/2014 1215   NITRITE NEGATIVE 01/25/2020 1936   LEUKOCYTESUR NEGATIVE 01/25/2020 1936   Sepsis Labs: @LABRCNTIP (procalcitonin:4,lacticidven:4)  ) Recent Results (from the past 240 hour(s))  SARS Coronavirus 2 by RT PCR (hospital order, performed in [facility] hospital lab) Nasopharyngeal Nasopharyngeal Swab     Status: None   Collection Time: 01/26/20  2:11 AM   Specimen: Nasopharyngeal Swab  Result Value Ref Range Status   SARS Coronavirus 2 NEGATIVE NEGATIVE Final    Comment: (NOTE) SARS-CoV-2 target nucleic acids are NOT  DETECTED.  The SARS-CoV-2 RNA is generally detectable in upper and lower respiratory specimens during the acute phase of infection. The lowest concentration of SARS-CoV-2 viral copies this assay can detect is 250 copies / mL. A negative result does not preclude SARS-CoV-2 infection and should not be used as the sole basis for treatment or other patient management decisions.  A negative result may occur with improper specimen collection / handling, submission of specimen other than nasopharyngeal swab, presence of viral mutation(s) within the areas targeted by this assay, and inadequate number of viral copies (<250 copies / mL). A negative result must be combined with clinical observations, patient history, and epidemiological information.  Fact Sheet for Patients:    Fact Sheet for Healthcare Providers: [facility]  This test is not yet approved or  cleared by the 01/28/20 FDA and has been authorized for detection and/or diagnosis of SARS-CoV-2 by FDA  under an Emergency Use Authorization (EUA).  This EUA will remain in effect (meaning this test can be used) for the duration of the COVID-19 declaration under Section 564(b)(1) of the Act, 21 U.S.C. section 360bbb-3(b)(1), unless the authorization is terminated or revoked sooner.  Performed at [facility], [address], [place], [place] [address]       Studies: No results found.  Scheduled Meds: . docusate sodium  100 mg Oral BID  . enoxaparin (LOVENOX) injection  40 mg Subcutaneous Q24H  . polyethylene glycol  17 g Oral Daily    Continuous Infusions: . ampicillin-sulbactam (UNASYN) IV 3 g (01/28/20 1320)  . dextrose 5 % and 0.9 % NaCl with KCl 20 mEq/L 100 mL/hr at 01/28/20 1318     LOS: 2 days     Darlin Drop, MD [facility] Pager [phone]  If 7PM-7AM, please contact  night-coverage [email or web address] Password [facility] 01/28/2020, 1:54 PM

## 2020-01-29 LAB — CBC
HCT: 37.4 % (ref 36.0–46.0)
Hemoglobin: 12.4 g/dL (ref 12.0–15.0)
MCH: 30.7 pg (ref 26.0–34.0)
MCHC: 33.2 g/dL (ref 30.0–36.0)
MCV: 92.6 fL (ref 80.0–100.0)
Platelets: 287 10*3/uL (ref 150–400)
RBC: 4.04 MIL/uL (ref 3.87–5.11)
RDW: 13.3 % (ref 11.5–15.5)
WBC: 10.3 10*3/uL (ref 4.0–10.5)
nRBC: 0 % (ref 0.0–0.2)

## 2020-01-29 LAB — BASIC METABOLIC PANEL
Anion gap: 8 (ref 5–15)
BUN: <5 mg/dL — ABNORMAL LOW (ref 6–20)
CO2: 24 mmol/L (ref 22–32)
Calcium: 8.6 mg/dL — ABNORMAL LOW (ref 8.9–10.3)
Chloride: 105 mmol/L (ref 98–111)
Creatinine, Ser: 0.74 mg/dL (ref 0.44–1.00)
GFR calc Af Amer: >60 mL/min (ref >60)
GFR calc non Af Amer: >60 mL/min (ref >60)
Glucose, Bld: 95 mg/dL (ref 70–99)
Potassium: 3.8 mmol/L (ref 3.5–5.1)
Sodium: 137 mmol/L (ref 135–145)

## 2020-01-29 MED ORDER — DOCUSATE SODIUM 100 MG PO CAPS: 100.0000 mg | ORAL_CAPSULE | Freq: Two times a day (BID) | ORAL | 0 refills | Status: DC

## 2020-01-29 MED ORDER — POLYETHYLENE GLYCOL 3350 17 G PO PACK: 17.0000 g | PACK | Freq: Every day | ORAL | 0 refills | Status: DC

## 2020-01-29 MED ORDER — SACCHAROMYCES BOULARDII 250 MG PO CAPS
250.0000 mg | ORAL_CAPSULE | Freq: Two times a day (BID) | ORAL | Status: DC
  Administered 2020-01-29: 250 mg via ORAL
  Filled 2020-01-29: qty 1

## 2020-01-29 MED ORDER — AMOXICILLIN-POT CLAVULANATE ER 1000-62.5 MG PO TB12
2.0000 | ORAL_TABLET | Freq: Two times a day (BID) | ORAL | Status: DC
  Administered 2020-01-29: 2 via ORAL
  Filled 2020-01-29: qty 2

## 2020-01-29 MED ORDER — AMOXICILLIN-POT CLAVULANATE ER 1000-62.5 MG PO TB12: 2.0000 | ORAL_TABLET | Freq: Two times a day (BID) | ORAL | 0 refills | Status: AC

## 2020-01-29 MED ORDER — SACCHAROMYCES BOULARDII 250 MG PO CAPS: 250.0000 mg | ORAL_CAPSULE | Freq: Two times a day (BID) | ORAL | 0 refills | Status: AC

## 2020-01-29 NOTE — TOC Progression Note (Signed)
Transition of Care [facility]) - Progression Note    Patient Details  Name: Beverly Wong MRN: [id number] Date of Birth: 1980-11-24  [facility]) CM/SW Contact  Geni Bers, RN Phone Number: 01/29/2020, 10:26 AM  Clinical Narrative:    Pt from home and plan to discharge home with no HH needs at present time.    Expected Discharge Plan: Home/Self Care Barriers to Discharge: No Barriers Identified  Expected Discharge Plan and Services Expected Discharge Plan: Home/Self Care       Living arrangements for the past 2 months: Single Family Home Expected Discharge Date: 01/29/20                                     Social Determinants of Health (SDOH) Interventions    Readmission Risk Interventions No flowsheet data found.

## 2020-01-29 NOTE — Discharge Summary (Signed)
Discharge Summary  Beverly Wong CBS:[id number] DOB: 1980-07-10  PCP: Fleet Contras, MD  Admit date: 01/25/2020 Discharge date: 01/29/2020  Time spent: 35 minutes  Recommendations for Outpatient Follow-up:  1. Follow-up with your PCP 2. Please obtain GI referral from your PCP 3. Follow-up with general surgery 4. Follow-up with your psychiatrist 5. Take your medications as prescribed.  Discharge Diagnoses:  Active Hospital Problems   Diagnosis Date Noted  . Sigmoid diverticulitis 01/26/2020  . Intraabdominal fluid collection 01/26/2020  . 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 Bipolar affective (HCC) 01/26/2020    Resolved Hospital Problems  No resolved problems to display.    Discharge Condition: Stable  Diet recommendation: Soft diet.  Vitals:   01/28/20 2149 01/29/20 0520  BP: 120/72 114/67  Pulse: 72 64  Resp: 18 16  Temp: 98.9 F (37.2 C) 98.5 F (36.9 C)  SpO2: 98% 99%    History of present illness:  Past medical history of chronic bronchitis, bipolar disorder, endometriosis SP ablation. Presents with diverticulitis with microperforation.  Seen by general surgery with plan for conservative management.  Received 4 days of IV antibiotics empirically.  Responded well, switched to Augmentin XL 1 tablet twice daily x7 days, on 01/29/2020.  Tolerating a soft diet well.  Denies any abdominal pain or nausea.  Had a bowel movement.  Nontoxic-appearing.  Afebrile, leukocytosis has resolved.  States she feels well and is eager to go home.  01/29/20: Seen and examined.  No acute events overnight.  She has no new complaints.  She is eager to go home.    Advised to obtain GI referral from her PCP and to follow-up with GI and colorectal surgery.  Patient understands and agrees to plan.  Hospital Course:  Active Problems:   Sigmoid diverticulitis   Intraabdominal fluid collection   COPD with chronic bronchitis (HCC)   Bipolar affective (HCC)  Resolving, acute  sigmoid diverticulitis with contained perforation. Appreciate general surgery consultation. Presents with diverticulitis with microperforation.  Seen by general surgery with plan for conservative management.  Received 4 days of IV antibiotics empirically.  Responded well, switched to Augmentin XL 1 tablet twice daily x7 days, on 01/29/2020.  Tolerating a soft diet well.  Denies any abdominal pain or nausea.  Had a bowel movement.  Nontoxic-appearing.  Afebrile, leukocytosis has resolved.   Will need outpatient colonoscopy in 6-8 weeks. Advised to obtain GI referral from her PCP and to follow-up with GI and colorectal surgery.  Patient understands and agrees to plan.  Resolved rectal pain, unclear etiology States she felt better after having a bowel movement. Denies rectal pain at the time of this visit.  Chronic bronchitis/tobacco use disorder Tobacco cessation counseling done at bedside Continue home regimen O2 saturation 99% on room air.  Bipolar disorder Continue Abilify Follow-up with your psychiatrist.  Cannabis use disorder UDS done on 01/26/2020 positive for THC. Cessation counseling  Obesity Body mass index is 33.45 kg/m. Recommend weight loss with regular physical activity and healthy dieting.     Code Status: Full  Procedures:  None.  Consultations:  General surgery.  Discharge Exam: BP 114/67 (BP Location: Right Arm)   Pulse 64   Temp 98.5 F (36.9 C) (Oral)   Resp 16   Ht 5\' 8"  (1.727 m)   Wt 99.8 kg   SpO2 99%   BMI 33.45 kg/m  . General: 39 y.o. year-old female well developed well nourished in no acute distress.  Alert and oriented x3. . Cardiovascular:  Regular rate and rhythm with no rubs or gallops.  No thyromegaly or JVD noted.   Marland Kitchen. Respiratory: Clear to auscultation with no wheezes or rales. Good inspiratory effort. . Abdomen: Soft nontender nondistended with normal bowel sounds x4 quadrants. . Musculoskeletal: No lower extremity edema  bilaterally. Marland Kitchen. Psychiatry: Mood is appropriate for condition and setting  Discharge Instructions You were cared for by a hospitalist during your hospital stay. If you have any questions about your discharge medications or the care you received while you were in the hospital after you are discharged, you can call the unit and asked to speak with the hospitalist on call if the hospitalist that took care of you is not available. Once you are discharged, your primary care physician will handle any further medical issues. Please note that NO REFILLS for any discharge medications will be authorized once you are discharged, as it is imperative that you return to your primary care physician (or establish a relationship with a primary care physician if you do not have one) for your aftercare needs so that they can reassess your need for medications and monitor your lab values.   Allergies as of 01/29/2020      Reactions   Ibuprofen Other (See Comments)   Can't take it with the Abilify she is on      Medication List    STOP taking these medications   amoxicillin 875 MG tablet Commonly known as: AMOXIL     TAKE these medications   Abilify Maintena 400 MG Prsy prefilled syringe Generic drug: ARIPiprazole ER Inject 400 mg into the muscle every 28 (twenty-eight) days.   acetaminophen 500 MG tablet Commonly known as: TYLENOL Take 1 tablet (500 mg total) by mouth every 6 (six) hours as needed.   albuterol 108 (90 Base) MCG/ACT inhaler Commonly known as: VENTOLIN HFA Inhale 2 puffs into the lungs every 4 (four) hours as needed for wheezing or shortness of breath (cough, shortness of breath or wheezing.).   amoxicillin-clavulanate 1000-62.5 MG 12 hr tablet Commonly known as: AUGMENTIN XR Take 2 tablets by mouth every 12 (twelve) hours for 7 days.   cyclobenzaprine 10 MG tablet Commonly known as: FLEXERIL Take 1 tablet (10 mg total) by mouth 2 (two) times daily as needed for muscle spasms.     docusate sodium 100 MG capsule Commonly known as: COLACE Take 1 capsule (100 mg total) by mouth 2 (two) times daily.   fluconazole 150 MG tablet Commonly known as: Diflucan Take 1 tablet (150 mg total) by mouth daily. Repeat in 1 week if needed   fluticasone 50 MCG/ACT nasal spray Commonly known as: FLONASE Place 1-2 sprays into both nostrils daily.   polyethylene glycol 17 g packet Commonly known as: MIRALAX / GLYCOLAX Take 17 g by mouth daily.   saccharomyces boulardii 250 MG capsule Commonly known as: FLORASTOR Take 1 capsule (250 mg total) by mouth 2 (two) times daily for 14 days.      Allergies  Allergen Reactions  . Ibuprofen Other (See Comments)    Can't take it with the Abilify she is on    Follow-up Information    Fleet ContrasAvbuere, Edwin, MD. Call in 1 day(s).   Specialty: Internal Medicine Why: please call for a post hospital follow up appointment Contact information: [address] [place] [address] [phone]        Andria MeuseWhite, Christopher M, MD. Go on 03/02/2020.   Specialty: General Surgery Why: at 10 AM  to discuss possible surgery for  your diverticulitis, please arrive by 9:30 to get cheked in and fill out any necessary paperwork Contact information: [address] [place] [address] [phone]                The results of significant diagnostics from this hospitalization (including imaging, microbiology, ancillary and laboratory) are listed below for reference.    Significant Diagnostic Studies: CT ABDOMEN PELVIS W CONTRAST  Result Date: 01/26/2020 CLINICAL DATA:  Abdomen pain with bloody stool EXAM: CT ABDOMEN AND PELVIS WITH CONTRAST TECHNIQUE: Multidetector CT imaging of the abdomen and pelvis was performed using the standard protocol following bolus administration of intravenous contrast. CONTRAST:  OMNIPAQUE IOHEXOL 300 MG/ML  SOLN COMPARISON:  None. FINDINGS: Lower chest: No acute abnormality. Hepatobiliary: No focal liver  abnormality is seen. No gallstones, gallbladder wall thickening, or biliary dilatation. Pancreas: Unremarkable. No pancreatic ductal dilatation or surrounding inflammatory changes. Spleen: Normal in size without focal abnormality. Adrenals/Urinary Tract: Adrenal glands are unremarkable. Kidneys are normal, without renal calculi, focal lesion, or hydronephrosis. Bladder is unremarkable. Stomach/Bowel: The stomach is nonenlarged. No dilated small bowel. Negative appendix. Transverse and left colon diverticular disease. Focal wall thickening with moderate inflammatory changes at the sigmoid colon consistent with acute diverticulitis. Small suspected extraluminal gas bubbles adjacent to the inflamed sigmoid colon, series 2, image number 71 concerning for contained perforation. Vascular/Lymphatic: No significant vascular findings are present. No enlarged abdominal or pelvic lymph nodes. Reproductive: Uterus unremarkable. Bilateral adnexal clips. Simple appearing right adnexal cyst measuring 3 cm. Other: 3.8 cm loculated fluid collection in the posterior pelvis without internal gas or strong rim enhancement at this time. Musculoskeletal: No acute or significant osseous findings. IMPRESSION: 1. Findings consistent with acute sigmoid colon diverticulitis. Small extraluminal gas bubbles adjacent to the inflamed sigmoid colon, concerning for contained perforation. 2. 3.8 cm loculated fluid collection in the posterior pelvis without internal gas or strong rim enhancement to suggest organized abscess at this time. 3. Critical Value/emergent results were called by telephone at the time of interpretation on 01/26/2020 at 1:21 am to provider [facility] , who verbally acknowledged these results. Electronically Signed   By: Jasmine Pang M.D.   On: 01/26/2020 01:21    Microbiology: Recent Results (from the past 240 hour(s))  SARS Coronavirus 2 by RT PCR (hospital order, performed in [facility] hospital lab) Nasopharyngeal  Nasopharyngeal Swab     Status: None   Collection Time: 01/26/20  2:11 AM   Specimen: Nasopharyngeal Swab  Result Value Ref Range Status   SARS Coronavirus 2 NEGATIVE NEGATIVE Final    Comment: (NOTE) SARS-CoV-2 target nucleic acids are NOT DETECTED.  The SARS-CoV-2 RNA is generally detectable in upper and lower respiratory specimens during the acute phase of infection. The lowest concentration of SARS-CoV-2 viral copies this assay can detect is 250 copies / mL. A negative result does not preclude SARS-CoV-2 infection and should not be used as the sole basis for treatment or other patient management decisions.  A negative result may occur with improper specimen collection / handling, submission of specimen other than nasopharyngeal swab, presence of viral mutation(s) within the areas targeted by this assay, and inadequate number of viral copies (<250 copies / mL). A negative result must be combined with clinical observations, patient history, and epidemiological information.  Fact Sheet for Patients:   BoilerBrush.com.cy  Fact Sheet for Healthcare Providers: https://pope.com/  This test is not yet approved or  cleared by the [place] FDA and has been authorized for  detection and/or diagnosis of SARS-CoV-2 by FDA under an Emergency Use Authorization (EUA).  This EUA will remain in effect (meaning this test can be used) for the duration of the COVID-19 declaration under Section 564(b)(1) of the Act, 21 U.S.C. section 360bbb-3(b)(1), unless the authorization is terminated or revoked sooner.  Performed at [facility], [address] [place], [place] [address]      Labs: Basic Metabolic Panel: Recent Labs  Lab 01/24/20 1138 01/25/20 1928 01/27/20 0456 01/28/20 0516 01/29/20 0508  NA 142 139 136 139 137  K 4.1 3.5 3.5 3.6 3.8  CL 104 102 101 103 105  CO2 25 25 25 25 24   GLUCOSE 112* 109* 107* 104*  95  BUN 7 10 6  5* <5*  CREATININE 0.78 0.79 0.82 0.81 0.74  CALCIUM 9.4 9.4 8.6* 8.6* 8.6*   Liver Function Tests: Recent Labs  Lab 01/24/20 1138 01/25/20 1928  AST 16 15  ALT 12 12  ALKPHOS 67 63  BILITOT 0.7 0.8  PROT 8.2* 8.0  ALBUMIN 4.7 4.5   Recent Labs  Lab 01/24/20 1138 01/25/20 1928  LIPASE 30 29   No results for input(s): AMMONIA in the last 168 hours. CBC: Recent Labs  Lab 01/24/20 1138 01/25/20 1928 01/27/20 0456 01/28/20 0516 01/29/20 0508  WBC 13.1* 14.9* 13.6* 12.2* 10.3  HGB 14.5 14.3 13.0 12.0 12.4  HCT 44.1 42.3 40.0 36.4 37.4  MCV 92.5 90.8 94.3 93.8 92.6  PLT 287 311 268 259 287   Cardiac Enzymes: No results for input(s): CKTOTAL, CKMB, CKMBINDEX, TROPONINI in the last 168 hours. BNP: BNP (last 3 results) No results for input(s): BNP in the last 8760 hours.  ProBNP (last 3 results) No results for input(s): PROBNP in the last 8760 hours.  CBG: No results for input(s): GLUCAP in the last 168 hours.     Signed:  01/30/20, MD [facility] 01/29/2020, 1:01 PM

## 2020-01-29 NOTE — Plan of Care (Signed)
  Problem: Health Behavior/Discharge Planning: Goal: Ability to manage health-related needs will improve Outcome: Progressing   Problem: Clinical Measurements: Goal: Ability to maintain clinical measurements within normal limits will improve Outcome: Progressing Goal: Will remain free from infection Outcome: Progressing   

## 2020-01-29 NOTE — Progress Notes (Signed)
Pt discharged to home instruction reviewed with pt and significant other. Acknowledged understanding.Family friend at be side with to transport home. Joellyn Quails,  RN

## 2020-01-29 NOTE — Progress Notes (Addendum)
Subjective NAEO. Pain resolved. Having loose, non-bloody stools. Tolerating PO.   Objective: Vital signs in last 24 hours: Temp:  [98.5 F (36.9 C)-98.9 F (37.2 C)] 98.5 F (36.9 C) (09/16 0520) Pulse Rate:  [64-72] 64 (09/16 0520) Resp:  [16-18] 16 (09/16 0520) BP: (114-120)/(60-72) 114/67 (09/16 0520) SpO2:  [97 %-99 %] 99 % (09/16 0520) Last BM Date: 01/26/20  Intake/Output from previous day: 09/15 0701 - 09/16 0700 In: 360 [P.O.:360] Out: -  Intake/Output this shift: No intake/output data recorded.  Gen: NAD, comfortable CV: RRR Pulm: Normal work of breathing Abd: Soft, nontender, No rebound no guarding. Nondistended Ext: SCDs in place  Lab Results: CBC  Recent Labs    01/28/20 0516 01/29/20 0508  WBC 12.2* 10.3  HGB 12.0 12.4  HCT 36.4 37.4  PLT 259 287   BMET Recent Labs    01/28/20 0516 01/29/20 0508  NA 139 137  K 3.6 3.8  CL 103 105  CO2 25 24  GLUCOSE 104* 95  BUN 5* <5*  CREATININE 0.81 0.74  CALCIUM 8.6* 8.6*   PT/INR No results for input(s): LABPROT, INR in the last 72 hours. ABG No results for input(s): PHART, HCO3 in the last 72 hours.  Invalid input(s): PCO2, PO2  Studies/Results:  Anti-infectives: Anti-infectives (From admission, onward)   Start     Dose/Rate Route Frequency Ordered Stop   01/29/20 0700  amoxicillin-clavulanate (AUGMENTIN XR) 1000-62.5 MG per 12 hr tablet 2 tablet        2 tablet Oral Every 12 hours 01/29/20 0615 02/05/20 0959   01/29/20 0000  amoxicillin-clavulanate (AUGMENTIN XR) 1000-62.5 MG 12 hr tablet        2 tablet Oral Every 12 hours 01/29/20 0620 02/05/20 2359   01/26/20 0800  Ampicillin-Sulbactam (UNASYN) 3 g in sodium chloride 0.9 % 100 mL IVPB  Status:  Discontinued        3 g 200 mL/hr over 30 Minutes Intravenous Every 6 hours 01/26/20 0234 01/29/20 0615   01/26/20 0130  piperacillin-tazobactam (ZOSYN) IVPB 3.375 g        3.375 g 100 mL/hr over 30 Minutes Intravenous  Once 01/26/20 0120 01/26/20  0237       Assessment/Plan: Patient Active Problem List   Diagnosis Date Noted  . Sigmoid diverticulitis 01/26/2020  . Intraabdominal fluid collection 01/26/2020  . 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 Bipolar affective (HCC) 01/26/2020  . Abnormal uterine bleeding (AUB) 03/04/2018  . Sleep apnea 02/01/2016  . COPD exacerbation (HCC) 02/01/2016  . Opioid use disorder, mild, on maintenance therapy, abuse (HCC) 02/01/2016  . Snorings 02/01/2016  . Cervical radiculopathy 06/21/2015   Chronic bronchitis Tobacco use/THC use Hx bronchitis Hx bipolar disorder/schizophrenia-on Abilify Hx CovidPositive8/9/202/Negative 01/26/20 Chronic constipation  BMI 33.45  Perforated acute sigmoid diverticulitis with 3.8 cm loculated fluid collection  FEN: SOFT ID: Zosyn 9/13 x 1; Unasyn 9/13 >> DVT: Lovenox  Follow up: TBD  Plan: abdominal pain resolved, afebrile, leukocytosis resolved. advance to SOFT diet. Leukocytosis resolved. Ok to transition to PO abx to complete a total of 10-14 day course. Stable for discharge from surgical perspective. Recommend follow up with GI for colonoscopy in 6-8 weeks. Will arrange for follow up with one of our colorectal surgeons to discuss elective surgery.    LOS: 3 days   Hosie Spangle. [facility] Use [email or web address] to contact on call provider

## 2020-02-18 ENCOUNTER — Emergency Department (HOSPITAL_BASED_OUTPATIENT_CLINIC_OR_DEPARTMENT_OTHER)
Admission: EM | Admit: 2020-02-18 | Discharge: 2020-02-18 | Disposition: A | Discharge status: Home / Self Care | Payer: Medicaid Other | Attending: Emergency Medicine | Admitting: Emergency Medicine

## 2020-02-18 ENCOUNTER — Encounter (HOSPITAL_BASED_OUTPATIENT_CLINIC_OR_DEPARTMENT_OTHER): Payer: Self-pay | Admitting: *Deleted

## 2020-02-18 ENCOUNTER — Other Ambulatory Visit: Payer: Self-pay

## 2020-02-18 DIAGNOSIS — J441 Chronic obstructive pulmonary disease with (acute) exacerbation: Secondary | ICD-10-CM | POA: Diagnosis not present

## 2020-02-18 DIAGNOSIS — M5442 Lumbago with sciatica, left side: Secondary | ICD-10-CM | POA: Diagnosis present

## 2020-02-18 DIAGNOSIS — M7918 Myalgia, other site: Secondary | ICD-10-CM

## 2020-02-18 DIAGNOSIS — F17210 Nicotine dependence, cigarettes, uncomplicated: Secondary | ICD-10-CM | POA: Diagnosis not present

## 2020-02-18 DIAGNOSIS — M25511 Pain in right shoulder: Secondary | ICD-10-CM | POA: Insufficient documentation

## 2020-02-18 MED ORDER — METHOCARBAMOL 500 MG PO TABS: 500.0000 mg | ORAL_TABLET | Freq: Two times a day (BID) | ORAL | 0 refills | Status: DC

## 2020-02-18 MED ORDER — NAPROXEN 375 MG PO TABS: 375.0000 mg | ORAL_TABLET | Freq: Two times a day (BID) | ORAL | 0 refills | Status: DC

## 2020-02-18 NOTE — ED Triage Notes (Signed)
C/ox 3 days ago , no OTC meds taken , c/o generalized pain

## 2020-02-18 NOTE — Discharge Instructions (Addendum)
Please refer to attached instructions
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
## 2020-03-03 ENCOUNTER — Encounter: Payer: Self-pay | Admitting: Physician Assistant

## 2020-03-19 ENCOUNTER — Ambulatory Visit: Payer: Medicaid Other | Admitting: Physician Assistant

## 2020-03-19 ENCOUNTER — Other Ambulatory Visit (INDEPENDENT_AMBULATORY_CARE_PROVIDER_SITE_OTHER): Payer: Medicaid Other

## 2020-03-19 ENCOUNTER — Encounter: Payer: Self-pay | Admitting: Physician Assistant

## 2020-03-19 VITALS — BP 108/70 | HR 76 | Ht 68.0 in | Wt 212.0 lb

## 2020-03-19 DIAGNOSIS — Z8719 Personal history of other diseases of the digestive system: Secondary | ICD-10-CM

## 2020-03-19 DIAGNOSIS — R109 Unspecified abdominal pain: Secondary | ICD-10-CM | POA: Diagnosis not present

## 2020-03-19 LAB — BASIC METABOLIC PANEL
BUN: 10 mg/dL (ref 6–23)
CO2: 28 mEq/L (ref 19–32)
Calcium: 9.2 mg/dL (ref 8.4–10.5)
Chloride: 107 mEq/L (ref 96–112)
Creatinine, Ser: 0.96 mg/dL (ref 0.40–1.20)
GFR: 74.4 mL/min (ref >60.00)
Glucose, Bld: 89 mg/dL (ref 70–99)
Potassium: 3.7 mEq/L (ref 3.5–5.1)
Sodium: 142 mEq/L (ref 135–145)

## 2020-03-19 MED ORDER — SUTAB 1479-225-188 MG PO TABS: 1.0000 | ORAL_TABLET | ORAL | 0 refills | Status: DC

## 2020-03-19 NOTE — Patient Instructions (Signed)
If you are age 39 or older, your body mass index should be between 23-30. Your Body mass index is 32.23 kg/m. If this is out of the aforementioned range listed, please consider follow up with your Primary Care Provider.  If you are age 39 or younger, your body mass index should be between 19-25. Your Body mass index is 32.23 kg/m. If this is out of the aformentioned range listed, please consider follow up with your Primary Care Provider.   Your provider has requested that you go to the basement level for lab work before leaving today. Press "B" on the elevator. The lab is located at the first door on the left as you exit the elevator.  You have been scheduled for a colonoscopy. Please follow written instructions given to you at your visit today.  Please pick up your prep supplies at the pharmacy within the next 1-3 days. If you use inhalers (even only as needed), please bring them with you on the day of your procedure.  You have been scheduled for a CT scan of the abdomen and pelvis at [facility], 1st floor Radiology. You are scheduled on 03/31/20  at 9:00 am. You should arrive 15 minutes prior to your appointment time for registration.  Please pick up 2 bottles of contrast from [facility] at least 3 days prior to your scan. The solution may taste better if refrigerated, but do NOT add ice or any other liquid to this solution. Shake well before drinking.   Please follow the written instructions below on the day of your exam:   1) Do not eat anything after 5:00 am (4 hours prior to your test)   2) Drink 1 bottle of contrast @ 7:00 am (2 hours prior to your exam)  Remember to shake well before drinking and do NOT pour over ice.     Drink 1 bottle of contrast @ 8:00 am (1 hour prior to your exam)   You may take any medications as prescribed with a small amount of water, if necessary. If you take any of the following medications: METFORMIN, GLUCOPHAGE, GLUCOVANCE, AVANDAMET, RIOMET,  FORTAMET, [place] MET, JANUMET, GLUMETZA or METAGLIP, you MAY be asked to HOLD this medication 48 hours AFTER the exam.   The purpose of you drinking the oral contrast is to aid in the visualization of your intestinal tract. The contrast solution may cause some diarrhea. Depending on your individual set of symptoms, you may also receive an intravenous injection of x-ray contrast/dye. Plan on being at [facility] for 45 minutes or longer, depending on the type of exam you are having performed.   If you have any questions regarding your exam or if you need to reschedule, you may call [facility] at [phone] between the hours of 8:00 am and 5:00 pm, Monday-Friday.   Add Miralax 17 grams in 8 ounces of water/ juice daily. Add Benefiber as directed daily.  Increase your water intake to at least 60 ounces daily.   Follow up pending the results of your Colonoscopy and CT

## 2020-03-19 NOTE — Progress Notes (Signed)
Subjective:    Patient ID: Beverly Wong, female    DOB: 1981/03/23, 39 y.o.   MRN: [id number]  HPI  Beverly Wong is a pleasant 39 year old African-American female, new to GI today and referred by Dr. [name] after recent hospitalization for acute diverticulitis.  Patient has not had any prior GI evaluation.  She does have history of sleep apnea, COPD, obesity, bipolar disorder, prior anterior cervical discectomy and fusion, and prior endometrial ablation. Patient presented to the emergency room on 01/25/2020 with acute lower abdominal pain and was hospitalized through 01/29/2020.  She was seen by surgery during that admission, did not have GI consultation.  On admission WBC was 14.3, hemoglobin 14. CT of the abdomen and pelvis showed transverse and left colon diverticulosis, there was focal wall thickening with moderate inflammatory changes at the sigmoid colon consistent with acute sigmoid diverticulitis.  There was small suspected extraluminal gas bubbles.  There was also a 3.8 cm loculated fluid collection in the posterior pelvis without internal gas or rim enhancement.  Not clear that this was an abscess, rule out cyst also with 3 cm right adnexal cyst. Patient was treated with IV antibiotics and then discharged home to complete a course of Augmentin. She says within a week of discharge from the hospital her symptoms have pretty much resolved and she has not had any recurrence.  She has had problems with constipation which has been a chronic issue and she is using Benefiber daily and MiraLAX 17 g a day in ounces of water daily which generally produces a bowel movement every 2 to 3 days. Family history is negative for colon cancer and polyps, she does have a cousin with Crohn's and says her father had some type of colitis.  Review of Systems Pertinent positive and negative review of systems were noted in the above HPI section.  All other review of systems was otherwise negative.  Outpatient  Encounter Medications as of 03/19/2020  Medication Sig  . albuterol (VENTOLIN HFA) 108 (90 Base) MCG/ACT inhaler Inhale 2 puffs into the lungs every 4 (four) hours as needed for wheezing or shortness of breath (cough, shortness of breath or wheezing.).  Marland Kitchen ARIPiprazole ER (ABILIFY MAINTENA) 400 MG PRSY prefilled syringe Inject 400 mg into the muscle every 28 (twenty-eight) days.  . polyethylene glycol (MIRALAX / GLYCOLAX) 17 g packet Take 17 g by mouth daily.  . Wheat Dextrin (BENEFIBER PO) Take by mouth.  . fluticasone (FLONASE) 50 MCG/ACT nasal spray Place 1-2 sprays into both nostrils daily.  . Sodium Sulfate-Mag Sulfate-KCl (SUTAB) [phone] MG TABS Take 1 kit by mouth as directed.  . [DISCONTINUED] acetaminophen (TYLENOL) 500 MG tablet Take 1 tablet (500 mg total) by mouth every 6 (six) hours as needed.  . [DISCONTINUED] cyclobenzaprine (FLEXERIL) 10 MG tablet Take 1 tablet (10 mg total) by mouth 2 (two) times daily as needed for muscle spasms. (Patient not taking: Reported on 03/19/2020)  . [DISCONTINUED] docusate sodium (COLACE) 100 MG capsule Take 1 capsule (100 mg total) by mouth 2 (two) times daily. (Patient not taking: Reported on 03/19/2020)  . [DISCONTINUED] fluconazole (DIFLUCAN) 150 MG tablet Take 1 tablet (150 mg total) by mouth daily. Repeat in 1 week if needed (Patient not taking: Reported on 03/19/2020)  . [DISCONTINUED] methocarbamol (ROBAXIN) 500 MG tablet Take 1 tablet (500 mg total) by mouth 2 (two) times daily. (Patient not taking: Reported on 03/19/2020)  . [DISCONTINUED] naproxen (NAPROSYN) 375 MG tablet Take 1 tablet (375 mg total) by mouth  2 (two) times daily. (Patient not taking: Reported on 03/19/2020)   No facility-administered encounter medications on file as of 03/19/2020.   Allergies  Allergen Reactions  . Ibuprofen Other (See Comments)    Can't take it with the Abilify she is on   Patient Active Problem List   Diagnosis Date Noted  . Sigmoid diverticulitis  01/26/2020  . Intraabdominal fluid collection 01/26/2020  . COPD with chronic bronchitis ([place]) 01/26/2020  . Bipolar affective ([place]) 01/26/2020  . Abnormal uterine bleeding (AUB) 03/04/2018  . Sleep apnea 02/01/2016  . COPD exacerbation ([place]) 02/01/2016  . Opioid use disorder, mild, on maintenance therapy, abuse ([place]) 02/01/2016  . Snorings 02/01/2016  . Cervical radiculopathy 06/21/2015   Social History   Socioeconomic History  . Marital status: Divorced    Spouse name: Not on file  . Number of children: Not on file  . Years of education: Not on file  . Highest education level: Not on file  Occupational History  . Not on file  Tobacco Use  . Smoking status: Current Every Day Smoker    Packs/day: 1.00    Years: 17.00    Pack years: 17.00    Types: Cigarettes  . Smokeless tobacco: Never Used  Vaping Use  . Vaping Use: Never used  Substance and Sexual Activity  . Alcohol use: Yes    Comment: on ocaasion.   . Drug use: Yes    Types: Cocaine, Marijuana    Comment: uses cocaine occasionally, smokes marajuana QD.  Marland Kitchen Sexual activity: Yes    Partners: Male    Birth control/protection: Surgical  Other Topics Concern  . Not on file  Social History Narrative  . Not on file   Social Determinants of Health   Financial Resource Strain:   . Difficulty of Paying Living Expenses: Not on file  Food Insecurity:   . Worried About Charity fundraiser in the Last Year: Not on file  . Ran Out of Food in the Last Year: Not on file  Transportation Needs:   . Lack of Transportation (Medical): Not on file  . Lack of Transportation (Non-Medical): Not on file  Physical Activity:   . Days of Exercise per Week: Not on file  . Minutes of Exercise per Session: Not on file  Stress:   . Feeling of Stress : Not on file  Social Connections:   . Frequency of Communication with Friends and Family: Not on file  . Frequency of Social Gatherings with Friends and Family: Not on file  . Attends  Religious Services: Not on file  . Active Member of Clubs or Organizations: Not on file  . Attends Archivist Meetings: Not on file  . Marital Status: Not on file  Intimate Partner Violence:   . Fear of Current or Ex-Partner: Not on file  . Emotionally Abused: Not on file  . Physically Abused: Not on file  . Sexually Abused: Not on file    Beverly Wong's family history includes Breast cancer in her maternal aunt; Diabetes in her mother; Kidney cancer in her father.      Objective:    Vitals:   03/19/20 1334  BP: 108/70  Pulse: 76  SpO2: 96%    Physical Exam Well-developed well-nourished  AA female  in no acute distress.  Height, Weight, 212BMI 32  HEENT; nontraumatic normocephalic, EOMI, PE RR LA, sclera anicteric. Oropharynx; not done Neck; supple, no JVD Cardiovascular; regular rate and rhythm with S1-S2, no murmur rub  or gallop Pulmonary; Clear bilaterally Abdomen; soft, obese basically nontender, nondistended, no palpable mass or hepatosplenomegaly, bowel sounds are active Rectal;not done Skin; benign exam, no jaundice rash or appreciable lesions Extremities; no clubbing cyanosis or edema skin warm and dry Neuro/Psych; alert and oriented x4, grossly nonfocal mood and affect appropriate       Assessment & Plan:   #59 39 year old African-American female here after recent hospitalization for first episode of diverticulitis with acute sigmoid diverticulitis, small suspected extraluminal gas bubbles noted on CT and a 3.8 cm loculated fluid collection in the posterior pelvis without internal gas or rim enhancement and therefore not clear that this was an abscess, rule out other pelvic cyst/lesion.  She also had a 3 cm right adnexal cyst.  Acute symptoms resolved after she completed the course of antibiotics and she has not had recurrence.  #2 chronic constipation #3 sleep apnea  #4 COPD #5  Bipolar disorder  Plan; continue Benefiber daily Continue MiraLAX 17 g  in 8 ounces of water on a daily basis.  We discussed increasing water intake to at least 60 ounces per day and following a high-fiber diet.  We will schedule for repeat CT of the abdomen pelvis for follow-up of the posterior pelvic fluid collection.  Assess for resolution if this was a small abscess versus other pelvic cyst/lesion  Patient will be scheduled for colonoscopy with Dr. [name]. This will intentionally be scheduled after the CT has been completed.  Procedure was discussed in detail with patient including indications risks and benefits and she is agreeable to proceed. She has not completed COVID-19 vaccination, She has been planning to get vaccinated, and she completes vaccination prior to colonoscopy will not require pretesting.  Patient was advised to call should she have any recurrence in symptoms in the interim before colonoscopy, advised that frequently antibiotics can be given if caught early and may be able to avoid ER visits.    Sundeep Destin S Tyus Kallam PA-C 03/19/2020   Cc: [name], MD

## 2020-03-20 NOTE — Progress Notes (Signed)
Attending Physician's Attestation   I have reviewed the chart.   I agree with the Advanced Practitioner's note, impression, and recommendations with any updates as below.    Vinita Prentiss Mansouraty, MD [facility] Advanced Endoscopy Office # [phone]  

## 2020-03-31 ENCOUNTER — Other Ambulatory Visit: Payer: Self-pay

## 2020-03-31 ENCOUNTER — Ambulatory Visit (HOSPITAL_COMMUNITY)
Admission: RE | Admit: 2020-03-31 | Discharge: 2020-03-31 | Disposition: A | Discharge status: Home / Self Care | Payer: Medicaid Other | Source: Ambulatory Visit | Attending: Physician Assistant | Admitting: Physician Assistant

## 2020-03-31 DIAGNOSIS — Z8719 Personal history of other diseases of the digestive system: Secondary | ICD-10-CM | POA: Diagnosis not present

## 2020-03-31 DIAGNOSIS — R109 Unspecified abdominal pain: Secondary | ICD-10-CM | POA: Insufficient documentation

## 2020-03-31 MED ORDER — IOHEXOL 300 MG/ML  SOLN
100.0000 mL | Freq: Once | INTRAMUSCULAR | Status: AC | PRN
  Administered 2020-03-31: 100 mL via INTRAVENOUS

## 2020-04-02 ENCOUNTER — Other Ambulatory Visit: Payer: Self-pay

## 2020-04-02 DIAGNOSIS — R109 Unspecified abdominal pain: Secondary | ICD-10-CM

## 2020-04-02 MED ORDER — CIPROFLOXACIN HCL 500 MG PO TABS: 500.0000 mg | ORAL_TABLET | Freq: Two times a day (BID) | ORAL | 0 refills | Status: AC

## 2020-04-02 MED ORDER — METRONIDAZOLE 500 MG PO TABS: 500.0000 mg | ORAL_TABLET | Freq: Two times a day (BID) | ORAL | 0 refills | Status: AC

## 2020-04-05 ENCOUNTER — Telehealth: Payer: Self-pay | Admitting: Physician Assistant

## 2020-04-05 NOTE — Telephone Encounter (Signed)
Patient is returning your call.  

## 2020-05-12 ENCOUNTER — Other Ambulatory Visit: Payer: Self-pay

## 2020-05-12 ENCOUNTER — Encounter: Payer: Medicaid Other | Admitting: Gastroenterology

## 2020-05-12 ENCOUNTER — Emergency Department (HOSPITAL_BASED_OUTPATIENT_CLINIC_OR_DEPARTMENT_OTHER): Payer: Medicaid Other

## 2020-05-12 ENCOUNTER — Inpatient Hospital Stay (HOSPITAL_BASED_OUTPATIENT_CLINIC_OR_DEPARTMENT_OTHER)
Admission: EM | Admit: 2020-05-12 | Discharge: 2020-05-15 | DRG: 392 | Disposition: A | Discharge status: Home / Self Care | Payer: Medicaid Other | Attending: Internal Medicine | Admitting: Internal Medicine

## 2020-05-12 ENCOUNTER — Encounter (HOSPITAL_BASED_OUTPATIENT_CLINIC_OR_DEPARTMENT_OTHER): Payer: Self-pay

## 2020-05-12 ENCOUNTER — Telehealth: Payer: Self-pay | Admitting: Gastroenterology

## 2020-05-12 ENCOUNTER — Emergency Department (HOSPITAL_COMMUNITY)
Admission: EM | Admit: 2020-05-12 | Discharge: 2020-05-12 | Disposition: A | Discharge status: Home / Self Care | Payer: Medicaid Other | Source: Home / Self Care

## 2020-05-12 DIAGNOSIS — G4733 Obstructive sleep apnea (adult) (pediatric): Secondary | ICD-10-CM | POA: Diagnosis present

## 2020-05-12 DIAGNOSIS — J449 Chronic obstructive pulmonary disease, unspecified: Secondary | ICD-10-CM | POA: Diagnosis present

## 2020-05-12 DIAGNOSIS — R1110 Vomiting, unspecified: Secondary | ICD-10-CM | POA: Insufficient documentation

## 2020-05-12 DIAGNOSIS — F3189 Other bipolar disorder: Secondary | ICD-10-CM | POA: Diagnosis present

## 2020-05-12 DIAGNOSIS — Z720 Tobacco use: Secondary | ICD-10-CM

## 2020-05-12 DIAGNOSIS — B373 Candidiasis of vulva and vagina: Secondary | ICD-10-CM | POA: Diagnosis present

## 2020-05-12 DIAGNOSIS — F319 Bipolar disorder, unspecified: Secondary | ICD-10-CM | POA: Diagnosis present

## 2020-05-12 DIAGNOSIS — Z5321 Procedure and treatment not carried out due to patient leaving prior to being seen by health care provider: Secondary | ICD-10-CM | POA: Insufficient documentation

## 2020-05-12 DIAGNOSIS — Z79899 Other long term (current) drug therapy: Secondary | ICD-10-CM

## 2020-05-12 DIAGNOSIS — K5732 Diverticulitis of large intestine without perforation or abscess without bleeding: Secondary | ICD-10-CM | POA: Diagnosis present

## 2020-05-12 DIAGNOSIS — B3731 Acute candidiasis of vulva and vagina: Secondary | ICD-10-CM | POA: Diagnosis not present

## 2020-05-12 DIAGNOSIS — R109 Unspecified abdominal pain: Secondary | ICD-10-CM | POA: Insufficient documentation

## 2020-05-12 DIAGNOSIS — Z981 Arthrodesis status: Secondary | ICD-10-CM

## 2020-05-12 DIAGNOSIS — K5720 Diverticulitis of large intestine with perforation and abscess without bleeding: Principal | ICD-10-CM | POA: Diagnosis present

## 2020-05-12 DIAGNOSIS — Z20822 Contact with and (suspected) exposure to covid-19: Secondary | ICD-10-CM | POA: Diagnosis present

## 2020-05-12 DIAGNOSIS — F209 Schizophrenia, unspecified: Secondary | ICD-10-CM | POA: Diagnosis present

## 2020-05-12 DIAGNOSIS — Z886 Allergy status to analgesic agent status: Secondary | ICD-10-CM

## 2020-05-12 DIAGNOSIS — K5780 Diverticulitis of intestine, part unspecified, with perforation and abscess without bleeding: Secondary | ICD-10-CM | POA: Diagnosis present

## 2020-05-12 DIAGNOSIS — F17290 Nicotine dependence, other tobacco product, uncomplicated: Secondary | ICD-10-CM | POA: Diagnosis present

## 2020-05-12 HISTORY — DX: Diverticulitis of intestine, part unspecified, without perforation or abscess without bleeding: K57.92

## 2020-05-12 LAB — URINALYSIS, ROUTINE W REFLEX MICROSCOPIC
Bilirubin Urine: NEGATIVE
Glucose, UA: NEGATIVE mg/dL
Ketones, ur: NEGATIVE mg/dL
Leukocytes,Ua: NEGATIVE
Nitrite: NEGATIVE
Protein, ur: NEGATIVE mg/dL
Specific Gravity, Urine: 1.03 (ref 1.005–1.030)
pH: 6 (ref 5.0–8.0)

## 2020-05-12 LAB — CBC
HCT: 41.9 % (ref 36.0–46.0)
Hemoglobin: 14.3 g/dL (ref 12.0–15.0)
MCH: 30.7 pg (ref 26.0–34.0)
MCHC: 34.1 g/dL (ref 30.0–36.0)
MCV: 89.9 fL (ref 80.0–100.0)
Platelets: 300 10*3/uL (ref 150–400)
RBC: 4.66 MIL/uL (ref 3.87–5.11)
RDW: 14.4 % (ref 11.5–15.5)
WBC: 19.5 10*3/uL — ABNORMAL HIGH (ref 4.0–10.5)
nRBC: 0 % (ref 0.0–0.2)

## 2020-05-12 LAB — URINALYSIS, MICROSCOPIC (REFLEX)

## 2020-05-12 LAB — CBC WITH DIFFERENTIAL/PLATELET
Abs Immature Granulocytes: 0.06 10*3/uL (ref 0.00–0.07)
Basophils Absolute: 0 10*3/uL (ref 0.0–0.1)
Basophils Relative: 0 %
Eosinophils Absolute: 0 10*3/uL (ref 0.0–0.5)
Eosinophils Relative: 0 %
HCT: 40.5 % (ref 36.0–46.0)
Hemoglobin: 13.8 g/dL (ref 12.0–15.0)
Immature Granulocytes: 0 %
Lymphocytes Relative: 12 %
Lymphs Abs: 1.7 10*3/uL (ref 0.7–4.0)
MCH: 30.4 pg (ref 26.0–34.0)
MCHC: 34.1 g/dL (ref 30.0–36.0)
MCV: 89.2 fL (ref 80.0–100.0)
Monocytes Absolute: 0.7 10*3/uL (ref 0.1–1.0)
Monocytes Relative: 5 %
Neutro Abs: 12.1 10*3/uL — ABNORMAL HIGH (ref 1.7–7.7)
Neutrophils Relative %: 83 %
Platelets: 273 10*3/uL (ref 150–400)
RBC: 4.54 MIL/uL (ref 3.87–5.11)
RDW: 14.4 % (ref 11.5–15.5)
WBC: 14.7 10*3/uL — ABNORMAL HIGH (ref 4.0–10.5)
nRBC: 0 % (ref 0.0–0.2)

## 2020-05-12 LAB — COMPREHENSIVE METABOLIC PANEL
ALT: 10 U/L (ref 0–44)
AST: 15 U/L (ref 15–41)
Albumin: 4.3 g/dL (ref 3.5–5.0)
Alkaline Phosphatase: 64 U/L (ref 38–126)
Anion gap: 10 (ref 5–15)
BUN: 8 mg/dL (ref 6–20)
CO2: 24 mmol/L (ref 22–32)
Calcium: 9.3 mg/dL (ref 8.9–10.3)
Chloride: 107 mmol/L (ref 98–111)
Creatinine, Ser: 0.83 mg/dL (ref 0.44–1.00)
GFR, Estimated: >60 mL/min (ref >60)
Glucose, Bld: 107 mg/dL — ABNORMAL HIGH (ref 70–99)
Potassium: 3.8 mmol/L (ref 3.5–5.1)
Sodium: 141 mmol/L (ref 135–145)
Total Bilirubin: 0.8 mg/dL (ref 0.3–1.2)
Total Protein: 7.9 g/dL (ref 6.5–8.1)

## 2020-05-12 LAB — LIPASE, BLOOD: Lipase: 22 U/L (ref 11–51)

## 2020-05-12 LAB — I-STAT BETA HCG BLOOD, ED (MC, WL, AP ONLY): I-stat hCG, quantitative: <5 m[IU]/mL (ref <5)

## 2020-05-12 MED ORDER — ONDANSETRON HCL 4 MG/2ML IJ SOLN
4.0000 mg | Freq: Once | INTRAMUSCULAR | Status: AC
  Administered 2020-05-12: 4 mg via INTRAVENOUS
  Filled 2020-05-12: qty 2

## 2020-05-12 MED ORDER — MORPHINE SULFATE (PF) 4 MG/ML IV SOLN
4.0000 mg | Freq: Once | INTRAVENOUS | Status: AC
Start: 2020-05-12 — End: 2020-05-12
  Administered 2020-05-12: 4 mg via INTRAVENOUS
  Filled 2020-05-12: qty 1

## 2020-05-12 MED ORDER — LACTATED RINGERS IV BOLUS
1000.0000 mL | Freq: Once | INTRAVENOUS | Status: AC
  Administered 2020-05-12: 1000 mL via INTRAVENOUS

## 2020-05-12 NOTE — ED Notes (Signed)
Pt called for v/s, no answer.  

## 2020-05-12 NOTE — Telephone Encounter (Signed)
If patient feels this way, she needs to be evaluated in the ED. Not sure it is safe for [place] to proceed with colonoscopy without repeat imaging showing things are improved or not worse. Please reach out and let her know. GM  FYI Dr. Chales Abrahams if she comes into the hospital.

## 2020-05-12 NOTE — ED Triage Notes (Signed)
Pt POV reports she was supposed to get colonoscopy done today, started bowel prep last night.  Pt reports abdominal pain since last night, emesis x3.  Unable to complete bowel prep due to pain.   Pt reports pain is similar to previous diverticulitis.

## 2020-05-12 NOTE — Telephone Encounter (Signed)
Pt is aware of MD's recommendation of going to ED- she states she will go to ED right now

## 2020-05-12 NOTE — Telephone Encounter (Signed)
Spoke with pt- she states she has been vomiting since starting her Sutabs yesterday evening.  She also states, "It feels like my colon has ripped like when I had the diverticulitis.  I am in severe pain."  I told her that I would reach out to Dr. Meridee Score immediately.  Dr. Everett Graff, Please advise.  Thanks, [employer]

## 2020-05-12 NOTE — ED Triage Notes (Signed)
Pt c/o abd pain n/v started yesterday-states sx started after started med for bowel prep for colonoscopy today-states she was advised to go to ED-[facility] ED PTA-NAD-steady gait

## 2020-05-12 NOTE — Telephone Encounter (Signed)
Good morning,  Patient called stating she is vomiting her prep, and she is experiencing bad stomach pains. Patient wants to know what to do. She is scheduled to come at 1:00pm//Dr. Mansouraty..  Please advise

## 2020-05-12 NOTE — ED Notes (Addendum)
Patient waiting in car

## 2020-05-12 NOTE — ED Provider Notes (Signed)
[facility] EMERGENCY DEPARTMENT Provider Note   CSN: [id number] Arrival date & time: 05/12/20  1538   History Chief Complaint  Patient presents with  . Abdominal Pain    Beverly Wong is a 39 y.o. female.  The history is provided by the patient.  Abdominal Pain She has history of bipolar disorder, diverticulitis and comes in because of abdominal pain which occurred when she took a prep for a colonoscopy.  This was last night.  Pain was mainly in the left lower abdomen but radiated across the suprapubic area.  Pain was severe and she rated it at 10/10.  There is associated nausea and vomiting as well as subjective fever and chills and sweats.  Pain has subsided somewhat and is down to 6/10.  Nothing makes it better, nothing makes it worse.  She has been able to drink small amount of fluids today.  Her gastroenterologist recommended that she come in for evaluation of the pain.  She initially went to [facility] emergency department where labs were drawn but she came here because of excessive wait time.  Apparently, colonoscopy was ordered because of her episode of diverticulitis which was 3 months ago.  Past Medical History:  Diagnosis Date  . Bipolar disorder ([place])   . Diverticulitis   . Headache   . Schizophrenia ([place])   . Shortness of breath dyspnea    feels short of breath at times even when seated  . UTI (lower urinary tract infection)     Patient Active Problem List   Diagnosis Date Noted  . Sigmoid diverticulitis 01/26/2020  . Intraabdominal fluid collection 01/26/2020  . 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 Bipolar affective ([place]) 01/26/2020  . Abnormal uterine bleeding (AUB) 03/04/2018  . Sleep apnea 02/01/2016  . COPD exacerbation ([place]) 02/01/2016  . 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 Snorings 02/01/2016  . Cervical radiculopathy 06/21/2015    Past Surgical History:  Procedure Laterality  Date  . ANTERIOR CERVICAL DECOMP/DISCECTOMY FUSION N/A 06/21/2015   Procedure: Cervical six- seven Anterior Cervical Decompression/ Diskectomy/ Fusion;  Surgeon: Kevan Ny Ditty, MD;  Location: [place] [facility];  Service: Neurosurgery;  Laterality: N/A;  C6-7 Anterior cervical decompression/diskectomy/fusion  . DILITATION & CURRETTAGE/HYSTROSCOPY WITH NOVASURE ABLATION N/A 03/04/2018   Procedure: DILATATION & CURETTAGE/HYSTEROSCOPY WITH NOVASURE ABLATION;  Surgeon: Sloan Leiter, MD;  Location: [place];  Service: Gynecology;  Laterality: N/A;  . TUBAL LIGATION  2009     OB History    Gravida  3   Para  1   Term  1   Preterm      AB  2   Living  1     SAB  1   IAB  1   Ectopic      Multiple      Live Births  1           Family History  Problem Relation Age of Onset  . Diabetes Mother   . Kidney cancer Father   . Breast cancer Maternal Aunt   . Colon cancer Neg Hx   . Pancreatic cancer Neg Hx   . Esophageal cancer Neg Hx     Social History   Tobacco Use  . Smoking status: Current Every Day Smoker    Packs/day: 1.00    Years: 17.00    Pack years: 17.00    Types: Cigarettes, E-cigarettes  . Smokeless tobacco: Never Used  Vaping Use  .  Vaping Use: Every day  Substance Use Topics  . Alcohol use: Yes    Comment: occ  . Drug use: Yes    Types: Marijuana    Home Medications Prior to Admission medications   Medication Sig Start Date End Date Taking? Authorizing Provider  albuterol (VENTOLIN HFA) 108 (90 Base) MCG/ACT inhaler Inhale 2 puffs into the lungs every 4 (four) hours as needed for wheezing or shortness of breath (cough, shortness of breath or wheezing.). 12/22/19   Raylene Everts, MD  ARIPiprazole ER (ABILIFY MAINTENA) 400 MG PRSY prefilled syringe Inject 400 mg into the muscle every 28 (twenty-eight) days.    [provider]  fluticasone (FLONASE) 50 MCG/ACT nasal spray Place 1-2 sprays into both nostrils daily. 12/22/19   Raylene Everts, MD  polyethylene glycol (MIRALAX / GLYCOLAX) 17 g packet Take 17 g by mouth daily. 01/29/20   Kayleen Memos, DO  Sodium Sulfate-Mag Sulfate-KCl (SUTAB) [phone] MG TABS Take 1 kit by mouth as directed. 03/19/20   Esterwood, Amy S, PA-C  Wheat Dextrin (BENEFIBER PO) Take by mouth.    [provider]    Allergies    Ibuprofen  Review of Systems   Review of Systems  Gastrointestinal: Positive for abdominal pain.  All other systems reviewed and are negative.   Physical Exam Updated Vital Signs BP 112/79 (BP Location: Right Arm)   Pulse 94   Temp 99 F (37.2 C) (Oral)   Resp 20   Wt 93.4 kg   SpO2 100%   BMI 31.32 kg/m   Physical Exam Vitals and nursing note reviewed.   39 year old female, resting comfortably and in no acute distress. Vital signs are normal. Oxygen saturation is 100%, which is normal. Head is normocephalic and atraumatic. PERRLA, EOMI. Oropharynx is clear. Neck is nontender and supple without adenopathy or JVD. Back is nontender and there is no CVA tenderness. Lungs are clear without rales, wheezes, or rhonchi. Chest is nontender. Heart has regular rate and rhythm without murmur. Abdomen is soft, flat, with tenderness fairly well localized to the left mid abdomen.  There is no rebound or guarding.  There are no masses or hepatosplenomegaly and peristalsis is hypoactive. Extremities have no cyanosis or edema, full range of motion is present. Skin is warm and dry without rash. Neurologic: Mental status is normal, cranial nerves are intact, there are no motor or sensory deficits.  ED Results / Procedures / Treatments   Labs (all labs ordered are listed, but only abnormal results are displayed) Labs Reviewed  URINALYSIS, ROUTINE W REFLEX MICROSCOPIC - Abnormal; Notable for the following components:      Result Value   APPearance CLOUDY (*)    Hgb urine dipstick SMALL (*)    All other components within normal limits  URINALYSIS, MICROSCOPIC  (REFLEX) - Abnormal; Notable for the following components:   Bacteria, UA MANY (*)    All other components within normal limits  CBC WITH DIFFERENTIAL/PLATELET - Abnormal; Notable for the following components:   WBC 14.7 (*)    Neutro Abs 12.1 (*)    All other components within normal limits  SARS CORONAVIRUS 2 (TAT 6-24 HRS)   Radiology CT ABDOMEN PELVIS W CONTRAST  Result Date: 05/13/2020 CLINICAL DATA:  Abdominal pain nausea vomiting EXAM: CT ABDOMEN AND PELVIS WITH CONTRAST TECHNIQUE: Multidetector CT imaging of the abdomen and pelvis was performed using the standard protocol following bolus administration of intravenous contrast. CONTRAST:  170m OMNIPAQUE IOHEXOL 300 MG/ML  SOLN COMPARISON:  None. FINDINGS: Lower chest: The visualized heart size within normal limits. No pericardial fluid/thickening. No hiatal hernia. The visualized portions of the lungs are clear. Hepatobiliary: The liver is normal in density without focal abnormality.The main portal vein is patent. No evidence of calcified gallstones, gallbladder wall thickening or biliary dilatation. Pancreas: Unremarkable. No pancreatic ductal dilatation or surrounding inflammatory changes. Spleen: Normal in size without focal abnormality. Adrenals/Urinary Tract: Both adrenal glands appear normal. The kidneys and collecting system appear normal without evidence of urinary tract calculus or hydronephrosis. Bladder is unremarkable. Stomach/Bowel: The stomach and small bowel are normal appearance. There is a focal segment of sigmoid colon with diverticula surrounding mesenteric fat stranding changes. There is tiny foci of free air seen posteriorly, series 2, image 62. There is also a small loculated fluid collection seen posteriorly measuring 2.3 x 1.3 cm. Vascular/Lymphatic: There are no enlarged mesenteric, retroperitoneal, or pelvic lymph nodes. No significant vascular findings are present. Reproductive: Bilateral tubal ligation clips are  noted. Within the left adnexa there is a 3.7 cm hypodense lesion, likely left ovarian cyst. An within the right adnexa there is a 3.5 cm low-density lesion, likely right ovarian cyst. Other: No evidence of abdominal wall mass or hernia. A small amount of free fluid is seen within the deep pelvis. Musculoskeletal: No acute or significant osseous findings. IMPRESSION: 1. Focal sigmoid colonic diverticulitis with findings consistent with focal contained perforation and a small pericolonic abscess measuring 2.3 x 1.3 cm. 2. Bilateral ovarian cysts the largest measuring 3.7 cm within the left ovary. Electronically Signed   By: Prudencio Pair M.D.   On: 05/13/2020 00:20    Procedures Procedures  Medications Ordered in ED Medications  piperacillin-tazobactam (ZOSYN) IVPB 3.375 g (has no administration in time range)  morphine 4 MG/ML injection 4 mg (4 mg Intravenous Given 05/12/20 2343)  ondansetron (ZOFRAN) injection 4 mg (4 mg Intravenous Given 05/12/20 2342)  lactated ringers bolus 1,000 mL (1,000 mLs Intravenous New Bag/Given 05/12/20 2341)  iohexol (OMNIPAQUE) 300 MG/ML solution 100 mL (100 mLs Intravenous Contrast Given 05/13/20 0001)    ED Course  I have reviewed the triage vital signs and the nursing notes.  Pertinent labs & imaging results that were available during my care of the patient were reviewed by me and considered in my medical decision making (see chart for details).  MDM Rules/Calculators/A&P Left-sided abdominal pain worrisome for recurrence of diverticulitis.  Old records reviewed confirming hospitalization in September for diverticulitis with microperforation.  Labs obtained earlier today at [facility] showed WBC of 19.5.  Lipase was normal and comprehensive metabolic panel was significant only for glucose of 107.  Will repeat CBC to see if there is any change in the WBC, and she is being sent for CT of abdomen and pelvis.  She is given IV fluids, morphine,  ondansetron.  CBC shows WBC has decreased compared with earlier in the day.  CT scan shows evidence of diverticulitis with contained perforation and abscess.  She started on piperacillin-tazobactam.  Case is discussed with Dr. Myna Hidalgo of [facility], who agrees to admit the patient.  Final Clinical Impression(s) / ED Diagnoses Final diagnoses:  Diverticulitis of large intestine with perforation and abscess without bleeding    Rx / DC Orders ED Discharge Orders    None       Delora Fuel, MD 02/54/27 [phone]

## 2020-05-13 DIAGNOSIS — Z79899 Other long term (current) drug therapy: Secondary | ICD-10-CM | POA: Diagnosis not present

## 2020-05-13 DIAGNOSIS — Z20822 Contact with and (suspected) exposure to covid-19: Secondary | ICD-10-CM | POA: Diagnosis present

## 2020-05-13 DIAGNOSIS — G4733 Obstructive sleep apnea (adult) (pediatric): Secondary | ICD-10-CM | POA: Diagnosis present

## 2020-05-13 DIAGNOSIS — Z720 Tobacco use: Secondary | ICD-10-CM

## 2020-05-13 DIAGNOSIS — Z981 Arthrodesis status: Secondary | ICD-10-CM | POA: Diagnosis not present

## 2020-05-13 DIAGNOSIS — F209 Schizophrenia, unspecified: Secondary | ICD-10-CM | POA: Diagnosis present

## 2020-05-13 DIAGNOSIS — J449 Chronic obstructive pulmonary disease, unspecified: Secondary | ICD-10-CM | POA: Diagnosis present

## 2020-05-13 DIAGNOSIS — K5720 Diverticulitis of large intestine with perforation and abscess without bleeding: Secondary | ICD-10-CM | POA: Diagnosis present

## 2020-05-13 DIAGNOSIS — K5780 Diverticulitis of intestine, part unspecified, with perforation and abscess without bleeding: Secondary | ICD-10-CM | POA: Diagnosis present

## 2020-05-13 DIAGNOSIS — K5732 Diverticulitis of large intestine without perforation or abscess without bleeding: Secondary | ICD-10-CM | POA: Diagnosis not present

## 2020-05-13 DIAGNOSIS — Z886 Allergy status to analgesic agent status: Secondary | ICD-10-CM | POA: Diagnosis not present

## 2020-05-13 DIAGNOSIS — B373 Candidiasis of vulva and vagina: Secondary | ICD-10-CM | POA: Diagnosis present

## 2020-05-13 DIAGNOSIS — F3189 Other bipolar disorder: Secondary | ICD-10-CM | POA: Diagnosis present

## 2020-05-13 DIAGNOSIS — F319 Bipolar disorder, unspecified: Secondary | ICD-10-CM | POA: Diagnosis not present

## 2020-05-13 DIAGNOSIS — F17290 Nicotine dependence, other tobacco product, uncomplicated: Secondary | ICD-10-CM | POA: Diagnosis present

## 2020-05-13 LAB — CBC
HCT: 37.9 % (ref 36.0–46.0)
Hemoglobin: 12.5 g/dL (ref 12.0–15.0)
MCH: 30.1 pg (ref 26.0–34.0)
MCHC: 33 g/dL (ref 30.0–36.0)
MCV: 91.3 fL (ref 80.0–100.0)
Platelets: 249 10*3/uL (ref 150–400)
RBC: 4.15 MIL/uL (ref 3.87–5.11)
RDW: 14.4 % (ref 11.5–15.5)
WBC: 12.4 10*3/uL — ABNORMAL HIGH (ref 4.0–10.5)
nRBC: 0 % (ref 0.0–0.2)

## 2020-05-13 LAB — BASIC METABOLIC PANEL
Anion gap: 8 (ref 5–15)
BUN: 9 mg/dL (ref 6–20)
CO2: 26 mmol/L (ref 22–32)
Calcium: 8.4 mg/dL — ABNORMAL LOW (ref 8.9–10.3)
Chloride: 106 mmol/L (ref 98–111)
Creatinine, Ser: 0.89 mg/dL (ref 0.44–1.00)
GFR, Estimated: >60 mL/min (ref >60)
Glucose, Bld: 110 mg/dL — ABNORMAL HIGH (ref 70–99)
Potassium: 3.3 mmol/L — ABNORMAL LOW (ref 3.5–5.1)
Sodium: 140 mmol/L (ref 135–145)

## 2020-05-13 LAB — SARS CORONAVIRUS 2 (TAT 6-24 HRS): SARS Coronavirus 2: NEGATIVE

## 2020-05-13 MED ORDER — ENOXAPARIN SODIUM 40 MG/0.4ML ~~LOC~~ SOLN
40.0000 mg | SUBCUTANEOUS | Status: DC
  Administered 2020-05-13 – 2020-05-14 (×2): 40 mg via SUBCUTANEOUS
  Filled 2020-05-13 (×2): qty 0.4

## 2020-05-13 MED ORDER — MORPHINE SULFATE (PF) 2 MG/ML IV SOLN: 2.0000 mg | INTRAVENOUS | Status: DC | PRN

## 2020-05-13 MED ORDER — PIPERACILLIN-TAZOBACTAM 3.375 G IVPB
3.3750 g | Freq: Three times a day (TID) | INTRAVENOUS | Status: DC
  Administered 2020-05-13 – 2020-05-15 (×6): 3.375 g via INTRAVENOUS
  Filled 2020-05-13 (×6): qty 50

## 2020-05-13 MED ORDER — ONDANSETRON HCL 4 MG/2ML IJ SOLN
4.0000 mg | Freq: Four times a day (QID) | INTRAMUSCULAR | Status: DC | PRN
  Administered 2020-05-13 – 2020-05-14 (×2): 4 mg via INTRAVENOUS
  Filled 2020-05-13 (×2): qty 2

## 2020-05-13 MED ORDER — ACETAMINOPHEN 325 MG PO TABS: 650.0000 mg | ORAL_TABLET | Freq: Four times a day (QID) | ORAL | Status: DC | PRN

## 2020-05-13 MED ORDER — IOHEXOL 300 MG/ML  SOLN
100.0000 mL | Freq: Once | INTRAMUSCULAR | Status: AC | PRN
  Administered 2020-05-13: 100 mL via INTRAVENOUS

## 2020-05-13 MED ORDER — ALBUTEROL SULFATE HFA 108 (90 BASE) MCG/ACT IN AERS
2.0000 | INHALATION_SPRAY | RESPIRATORY_TRACT | Status: DC | PRN
  Filled 2020-05-13: qty 6.7

## 2020-05-13 MED ORDER — PIPERACILLIN-TAZOBACTAM 3.375 G IVPB 30 MIN
3.3750 g | Freq: Once | INTRAVENOUS | Status: AC
  Administered 2020-05-13: 01:00:00 3.375 g via INTRAVENOUS
  Filled 2020-05-13: qty 50

## 2020-05-13 MED ORDER — MORPHINE SULFATE (PF) 2 MG/ML IV SOLN
2.0000 mg | INTRAVENOUS | Status: DC | PRN
  Administered 2020-05-13: 4 mg via INTRAVENOUS
  Filled 2020-05-13: qty 2

## 2020-05-13 MED ORDER — ACETAMINOPHEN 650 MG RE SUPP: 650.0000 mg | Freq: Four times a day (QID) | RECTAL | Status: DC | PRN

## 2020-05-13 MED ORDER — SODIUM CHLORIDE 0.9 % IV SOLN: INTRAVENOUS | Status: AC

## 2020-05-13 MED ORDER — HYDROMORPHONE HCL 1 MG/ML IJ SOLN
1.0000 mg | Freq: Once | INTRAMUSCULAR | Status: AC
Start: 2020-05-13 — End: 2020-05-13
  Administered 2020-05-13: 08:00:00 1 mg via INTRAVENOUS
  Filled 2020-05-13: qty 1

## 2020-05-13 MED ORDER — HYDROCODONE-ACETAMINOPHEN 5-325 MG PO TABS
1.0000 | ORAL_TABLET | ORAL | Status: DC | PRN
  Administered 2020-05-13 – 2020-05-15 (×5): 2 via ORAL
  Filled 2020-05-13 (×5): qty 2

## 2020-05-13 MED ORDER — POLYETHYLENE GLYCOL 3350 17 G PO PACK: 17.0000 g | PACK | Freq: Every day | ORAL | Status: DC | PRN

## 2020-05-13 NOTE — Telephone Encounter (Signed)
The pt has been sent the appt info via MyChart and will be given appt information at discharge.

## 2020-05-13 NOTE — Progress Notes (Signed)
Pharmacy Antibiotic Note   Beverly Wong is a 39 y.o. female admitted on 05/12/2020 with diverticulitis with abscess.  Pharmacy has been consulted for Zosyn dosing. WBC elevated. Renal function good. Hx of diverticulitis.   Plan: Zosyn 3.375G IV q8h to be infused over 4 hours Trend WBC, temp, renal function  F/U infectious work-up   Weight: 93.4 kg (206 lb)  Temp (24hrs), Avg:98.7 F (37.1 C), Min:98.2 F (36.8 C), Max:99 F (37.2 C)  Recent Labs  Lab 05/12/20 1210 05/12/20 2321  WBC 19.5* 14.7*  CREATININE 0.83  --     Estimated Creatinine Clearance: 108.7 mL/min (by C-G formula based on SCr of 0.83 mg/dL).    Allergies  Allergen Reactions  . Ibuprofen Other (See Comments)    Can't take it with the Abilify she is on    Abran Duke, PharmD, BCPS Clinical Pharmacist Phone: [phone]

## 2020-05-13 NOTE — H&P (Addendum)
History and Physical        Hospital Admission Note Date: 05/13/2020  Patient name: Beverly Wong Medical record number: [id number] Date of birth: 1981/03/08 Age: 39 y.o. Gender: female  PCP: [name], MD    Chief Complaint    Chief Complaint  Patient presents with  . Abdominal Pain      HPI:   This is a 39 year old female with past medical history of bipolar disorder, sigmoid diverticulitis with microperforation, schizophrenia, tobacco use, COPD, OSA who presented to [facility] on 12/29 for abdominal pain which occurred after taking prep for a colonoscopy which was scheduled for 12/29 with Dr. [name].  Initially went to [facility] and had labs drawn but left due to long wait times and went to [facility]. Colonoscopy initially ordered due to an episode of diverticulitis about 3 months ago.  Patient contacted her gastroenterologist on 12/29 AM due to vomiting and left lower quadrant abdominal pain radiating to suprapubic area after starting colon prep the evening of 12/28.  She was advised to proceed to the ED for imaging to rule out diverticulitis.  Associated with subjective fever and chills and sweats.  Nothing makes it better or worse.  States that she did have a nonbloody BM last night with mucus and has only urinated once in the past 24 hours. No other complaints   ED Course: Afebrile, hemodynamically stable, on room air. Notable Labs: Sodium 141, K3.8, BUN 8, creatinine 0.83, alk phos 64, AST 15, ALT 10, WBC 19.5-> 14.7, Hb 14.3, platelets 300, UA contaminated, COVID-19 negative. Notable Imaging: CT abdomen pelvis-focal sigmoid colonic diverticulitis with findings consistent with focal contained perforation and small pericolonic abscess measuring 2.3 x 1.3 cm and bilateral ovarian cysts. Patient received morphine, Zofran, LR, Zosyn and was transported to [facility] for further  management   Vitals:   05/13/20 0500 05/13/20 0744  BP: 105/65 114/70  Pulse: 80 84  Resp: 20 16  Temp:  99 F (37.2 C)  SpO2: 95% 97%     Review of Systems:  Review of Systems  All other systems reviewed and are negative.   Medical/Social/Family History   Past Medical History: Past Medical History:  Diagnosis Date  . Bipolar disorder ([place])   . Diverticulitis   . Headache   . Schizophrenia ([place])   . Shortness of breath dyspnea    feels short of breath at times even when seated  . UTI (lower urinary tract infection)     Past Surgical History:  Procedure Laterality Date  . ANTERIOR CERVICAL DECOMP/DISCECTOMY FUSION N/A 06/21/2015   Procedure: Cervical six- seven Anterior Cervical Decompression/ Diskectomy/ Fusion;  Surgeon: Kevan Ny Ditty, MD;  Location: [place] [facility];  Service: Neurosurgery;  Laterality: N/A;  C6-7 Anterior cervical decompression/diskectomy/fusion  . DILITATION & CURRETTAGE/HYSTROSCOPY WITH NOVASURE ABLATION N/A 03/04/2018   Procedure: DILATATION & CURETTAGE/HYSTEROSCOPY WITH NOVASURE ABLATION;  Surgeon: Sloan Leiter, MD;  Location: [place] [facility];  Service: Gynecology;  Laterality: N/A;  . TUBAL LIGATION  2009    Medications: Prior to Admission medications   Medication Sig Start Date End Date Taking? Authorizing Provider  albuterol (VENTOLIN HFA) 108 (90 Base) MCG/ACT inhaler Inhale 2 puffs into the lungs every 4 (four) hours  as needed for wheezing or shortness of breath (cough, shortness of breath or wheezing.). 12/22/19   Raylene Everts, MD  ARIPiprazole ER (ABILIFY MAINTENA) 400 MG PRSY prefilled syringe Inject 400 mg into the muscle every 28 (twenty-eight) days.    [provider]  fluticasone (FLONASE) 50 MCG/ACT nasal spray Place 1-2 sprays into both nostrils daily. 12/22/19   Raylene Everts, MD  polyethylene glycol (MIRALAX / GLYCOLAX) 17 g packet Take 17 g by mouth daily. 01/29/20   Kayleen Memos, DO  Sodium Sulfate-Mag Sulfate-KCl  (SUTAB) [phone] MG TABS Take 1 kit by mouth as directed. 03/19/20   Esterwood, Amy S, PA-C  Wheat Dextrin (BENEFIBER PO) Take by mouth.    [provider]    Allergies:   Allergies  Allergen Reactions  . Ibuprofen Other (See Comments)    Can't take it with the Abilify she is on    Social History:  reports that she has been smoking cigarettes and e-cigarettes. She has a 17.00 pack-year smoking history. She has never used smokeless tobacco. She reports current alcohol use. She reports current drug use. Drug: Marijuana.  Family History: Family History  Problem Relation Age of Onset  . Diabetes Mother   . Kidney cancer Father   . Breast cancer Maternal Aunt   . Colon cancer Neg Hx   . Pancreatic cancer Neg Hx   . Esophageal cancer Neg Hx      Objective   Physical Exam: Blood pressure 114/70, pulse 84, temperature 99 F (37.2 C), temperature source Oral, resp. rate 16, weight 93.4 kg, SpO2 97 %.  Physical Exam Vitals and nursing note reviewed.  Constitutional:      Appearance: Normal appearance.  HENT:     Head: Normocephalic and atraumatic.  Eyes:     Conjunctiva/sclera: Conjunctivae normal.  Cardiovascular:     Rate and Rhythm: Normal rate and regular rhythm.  Pulmonary:     Effort: Pulmonary effort is normal.     Breath sounds: Normal breath sounds.  Abdominal:     General: Abdomen is flat.     Palpations: Abdomen is soft.     Tenderness: There is abdominal tenderness in the right upper quadrant.     Comments: RUQ tenderness to palpation, LLQ nontender  Musculoskeletal:        General: No swelling or tenderness.  Skin:    Coloration: Skin is not jaundiced or pale.  Neurological:     Mental Status: She is alert. Mental status is at baseline.  Psychiatric:        Mood and Affect: Mood normal.        Behavior: Behavior normal.     LABS on Admission: I have personally reviewed all the labs and imaging below    Basic Metabolic Panel: Recent Labs   Lab 05/12/20 1210 05/13/20 0905  NA 141 140  K 3.8 3.3*  CL 107 106  CO2 24 26  GLUCOSE 107* 110*  BUN 8 9  CREATININE 0.83 0.89  CALCIUM 9.3 8.4*   Liver Function Tests: Recent Labs  Lab 05/12/20 1210  AST 15  ALT 10  ALKPHOS 64  BILITOT 0.8  PROT 7.9  ALBUMIN 4.3   Recent Labs  Lab 05/12/20 1210  LIPASE 22   No results for input(s): AMMONIA in the last 168 hours. CBC: Recent Labs  Lab 05/12/20 2321 05/13/20 0905  WBC 14.7* 12.4*  NEUTROABS 12.1*  --   HGB 13.8 12.5  HCT 40.5 37.9  MCV 89.2 91.3  PLT 273 249   Cardiac Enzymes: No results for input(s): CKTOTAL, CKMB, CKMBINDEX, TROPONINI in the last 168 hours. BNP: Invalid input(s): POCBNP CBG: No results for input(s): GLUCAP in the last 168 hours.  Radiological Exams on Admission:  CT ABDOMEN PELVIS W CONTRAST  Result Date: 05/13/2020 CLINICAL DATA:  Abdominal pain nausea vomiting EXAM: CT ABDOMEN AND PELVIS WITH CONTRAST TECHNIQUE: Multidetector CT imaging of the abdomen and pelvis was performed using the standard protocol following bolus administration of intravenous contrast. CONTRAST:  117m OMNIPAQUE IOHEXOL 300 MG/ML  SOLN COMPARISON:  None. FINDINGS: Lower chest: The visualized heart size within normal limits. No pericardial fluid/thickening. No hiatal hernia. The visualized portions of the lungs are clear. Hepatobiliary: The liver is normal in density without focal abnormality.The main portal vein is patent. No evidence of calcified gallstones, gallbladder wall thickening or biliary dilatation. Pancreas: Unremarkable. No pancreatic ductal dilatation or surrounding inflammatory changes. Spleen: Normal in size without focal abnormality. Adrenals/Urinary Tract: Both adrenal glands appear normal. The kidneys and collecting system appear normal without evidence of urinary tract calculus or hydronephrosis. Bladder is unremarkable. Stomach/Bowel: The stomach and small bowel are normal appearance. There is a  focal segment of sigmoid colon with diverticula surrounding mesenteric fat stranding changes. There is tiny foci of free air seen posteriorly, series 2, image 62. There is also a small loculated fluid collection seen posteriorly measuring 2.3 x 1.3 cm. Vascular/Lymphatic: There are no enlarged mesenteric, retroperitoneal, or pelvic lymph nodes. No significant vascular findings are present. Reproductive: Bilateral tubal ligation clips are noted. Within the left adnexa there is a 3.7 cm hypodense lesion, likely left ovarian cyst. An within the right adnexa there is a 3.5 cm low-density lesion, likely right ovarian cyst. Other: No evidence of abdominal wall mass or hernia. A small amount of free fluid is seen within the deep pelvis. Musculoskeletal: No acute or significant osseous findings. IMPRESSION: 1. Focal sigmoid colonic diverticulitis with findings consistent with focal contained perforation and a small pericolonic abscess measuring 2.3 x 1.3 cm. 2. Bilateral ovarian cysts the largest measuring 3.7 cm within the left ovary. Electronically Signed   By: BPrudencio PairM.D.   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 Sigmoid diverticulitis with contained perforation and 2.3 x 1.3 cm pericolonic abscess a. Afebrile and hemodynamically stable on room air b. WBC improving with IV fluids and antibiotics-continue Zosyn and fluids c. Repeat labs today d. will consult general surgery e. N.p.o. except for sips with meds  2. Tobacco use a. Counseled on cessation b. Does not want a nicotine patch  3. Bipolar disorder  schizophrenia, stable a. Outpatient follow-up  4. COPD, not in exacerbation a. Continue home nebulizer as needed    DVT prophylaxis: Lovenox   Code Status: Prior  Diet: N.p.o.  except for sips with meds Family Communication: Admission, patients condition and plan of care including tests being ordered have been discussed with the patient who indicates understanding and agrees with the plan and Code Status. Disposition Plan: The appropriate patient status for this patient is INPATIENT. Inpatient status is judged to be reasonable and necessary in order to provide the required intensity of service to ensure the patient's safety. The patient's presenting symptoms,  physical exam findings, and initial radiographic and laboratory data in the context of their chronic comorbidities is felt to place them at high risk for further clinical deterioration. Furthermore, it is not anticipated that the patient will be medically stable for discharge from the hospital within 2 midnights of admission. The following factors support the patient status of inpatient.   " The patient's presenting symptoms include abdominal pain. " The worrisome physical exam findings include abdominal pain. " The initial radiographic and laboratory data are worrisome because of diverticulitis with abscess. " The chronic co-morbidities include tobacco use, diverticulitis.   * I certify that at the point of admission it is my clinical judgment that the patient will require inpatient hospital care spanning beyond 2 midnights from the point of admission due to high intensity of service, high risk for further deterioration and high frequency of surveillance required.*   Status is: Inpatient  Remains inpatient appropriate because:IV treatments appropriate due to intensity of illness or inability to take PO and Inpatient level of care appropriate due to severity of illness   Dispo: The patient is from: Home              Anticipated d/c is to: Home              Anticipated d/c date is: 2 days              Patient currently is not medically stable to d/c.     Consultants  . General surgery  Procedures   . None  Time Spent on Admission: 63 minutes    Harold Hedge, DO [facility]  05/13/2020, 10:00 AM

## 2020-05-13 NOTE — Telephone Encounter (Signed)
Baxter Hire, Thank you for the update. Looks like she has made her way to the ED finally and underwent a repeat CT scan that shows evidence of recurrent microperforation.  This is a good thing but she called to tell us because this would have been a contraindication to moving forward with her procedure yesterday.  We will send a message to the inpatient GI team in case they are called but further discussion with surgery likely will need to be performed during her inpatient stay to define a period of antibiotics and when they think it would be safe to undergo surgical evaluation in the setting of these recurrent bouts.  It is not clear if she will be able to undergo an endoscopic evaluation as an outpatient but I guess we will find out when she is discharged from the hospital. Patty please place a follow-up appointment reminder for 4 to 5 weeks from now. Dr. Chales Abrahams and PA Kerry Dory about this patient who is admitted to the hospital who was supposed to undergo an outpatient colonoscopy yesterday but found to have recurrent pain with evidence of microperforation.  In case you get called today.  GM

## 2020-05-13 NOTE — ED Notes (Signed)
COVID SWAB OBTAINED 

## 2020-05-13 NOTE — ED Notes (Signed)
Attempted to call report to [place], [place] at [facility]

## 2020-05-13 NOTE — Consult Note (Signed)
[facility] Surgery Consult Note  Beverly Wong 02-13-1981  [id number].    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  It was not clear whether this was solely sigmoid diverticulitis or possible GYN process.  There was also a 3 cm right adnexal cyst in the area of the presumed diverticulitis.  She was treated with a combined 10-day course of IV antibiotics and then converted to oral antibiotics.  She had a second episode of diverticulitis which was treated by [facility] in October.  She reports a 14-day course of antibiotics at that time.  She was seen on 03/19/2020, by [facility].  She was being scheduled for colonoscopy and was given bowel prep instructions.  She did complain of some abdominal pain at that time, but went forward with a bowel prep.  She reports having ongoing issues with chronic constipation, sleep apnea, COPD and bipolar disorder.  She was  scheduled for colonoscopy, and called yesterday to their office noting she was vomiting her prep and experiencing bad abdominal pain.  She was referred to the ED for further evaluation yesterday a.m.  First ED note is at 10:06 AM.  She apparently had to wait in her car until at least 8 PM.  The first provider note is at 11:14 PM.  Work-up in the ED shows temperature is 99.3.  Other vital signs are stable. Initial labs yesterday afternoon shows a normal CMP.  WBC 19.5, H/H 14.3/41.9, platelets 300,000. Repeat labs this a.m. shows a sodium 140, potassium of 3.3, glucose 110, creatinine 0.89.  WBC 12.4, H/H 12.5/37.9.  CT scan shows there is a focal segment of sigmoid diverticula with surrounding mesenteric fat stranding.  There is a tiny  foci of free air is seen posteriorly.  There is also a loculated fluid collection seen posteriorly measuring 2.3 x 1.3 cm.  She has had bilateral tubal ligations with the left adnexa again measuring 3.7 cm hypodense lesion likely left ovarian cyst.  Within the right adnexa there is also a 3.5 cm lesion which is thought to be an ovarian cyst also. We are asked to see.   ROS: Review of Systems  Constitutional: Negative.   HENT: Positive for hearing loss (She is thinks she has some hearing loss in her left ear.).   Eyes: Negative.   Respiratory: Positive for wheezing.        She continues to vape and use marijuana daily.  She has what she calls continuous bronchitis but it sounds more like asthma exacerbations and she has an inhaler for this.  Cardiovascular: Negative.   Gastrointestinal: Positive for abdominal pain (Abdominal pain started the lower mid abdomen and is now localized mostly in the left lower quadrant.), constipation (She has chronic constipation takes Benefiber MiraLAX intermittently.), diarrhea (She had diarrhea with a bowel prep.), nausea and vomiting. Negative for blood in stool and melena.  Genitourinary: Negative.   Musculoskeletal: Negative.   Skin: Negative.   Neurological: Negative.   Endo/Heme/Allergies: Negative.   Psychiatric/Behavioral:       She has a history of of schizophrenia and bipolar disorder.  She is treated by [facility], and has monthly injections of her medications.    Family History  Problem Relation Age of Onset  . Diabetes  Mother   . Kidney cancer Father   . Breast cancer Maternal Aunt   . Colon cancer Neg Hx   . Pancreatic cancer Neg Hx   . Esophageal cancer Neg Hx     Past Medical History:  Diagnosis Date  . Bipolar disorder ([place])   . Diverticulitis   . Headache   . Schizophrenia ([place])   . Shortness of breath dyspnea    feels short of breath at times even when seated  . UTI (lower urinary tract infection)     Past Surgical History:   Procedure Laterality Date  . ANTERIOR CERVICAL DECOMP/DISCECTOMY FUSION N/A 06/21/2015   Procedure: Cervical six- seven Anterior Cervical Decompression/ Diskectomy/ Fusion;  Surgeon: Kevan Ny Ditty, MD;  Location: [facility];  Service: Neurosurgery;  Laterality: N/A;  C6-7 Anterior cervical decompression/diskectomy/fusion  . DILITATION & CURRETTAGE/HYSTROSCOPY WITH NOVASURE ABLATION N/A 03/04/2018   Procedure: DILATATION & CURETTAGE/HYSTEROSCOPY WITH NOVASURE ABLATION;  Surgeon: Sloan Leiter, MD;  Location: [facility];  Service: Gynecology;  Laterality: N/A;  . TUBAL LIGATION  2009    Social History:  reports that she has been smoking cigarettes and e-cigarettes. She has a 17.00 pack-year smoking history. She has never used smokeless tobacco. She reports current alcohol use. She reports current drug use. Drug: Marijuana. Tobacco: She is quit using cigarettes, but vapes EtOH: Social Drugs: She uses marijuana daily She is unemployed and lives with a friend.   Allergies:  Allergies  Allergen Reactions  . Ibuprofen Other (See Comments)    Can't take it with the Abilify she is on    Medications Prior to Admission  Medication Sig Dispense Refill  . albuterol (VENTOLIN HFA) 108 (90 Base) MCG/ACT inhaler Inhale 2 puffs into the lungs every 4 (four) hours as needed for wheezing or shortness of breath (cough, shortness of breath or wheezing.). 18 g 0  . ARIPiprazole ER (ABILIFY MAINTENA) 400 MG PRSY prefilled syringe Inject 400 mg into the muscle every 28 (twenty-eight) days.    . fluticasone (FLONASE) 50 MCG/ACT nasal spray Place 1-2 sprays into both nostrils daily. (Patient taking differently: Place 1-2 sprays into both nostrils daily as needed for allergies.) 16 g 0  . guaiFENesin (ROBITUSSIN) 100 MG/5ML liquid Take 200 mg by mouth 3 (three) times daily as needed for cough.    . Sodium Sulfate-Mag Sulfate-KCl (SUTAB) [phone] MG TABS Take 1 kit by mouth as directed. 24 tablet 0  .  polyethylene glycol (MIRALAX / GLYCOLAX) 17 g packet Take 17 g by mouth daily. (Patient not taking: Reported on 05/13/2020) 14 each 0    Blood pressure 114/70, pulse 84, temperature 99 F (37.2 C), temperature source Oral, resp. rate 16, weight 93.4 kg, SpO2 97 %. Physical Exam:  General: pleasant, WD, WN AA female who is laying in bed in NAD HEENT: head is normocephalic, atraumatic.  Sclera are noninjected.  PERRL.  Ears and nose without any masses or lesions.  Mouth is pink and moist Heart: regular, rate, and rhythm.  Normal s1,s2. No obvious murmurs, gallops, or rubs noted.  Palpable radial and pedal pulses bilaterally Lungs: CTAB, no wheezes, rhonchi, or rales noted.  Respiratory effort nonlabored Abd: soft, tender mostly in the LLQ, ND, +BS, no masses, hernias, or organomegaly MS: all 4 extremities are symmetrical with no cyanosis, clubbing, or edema. Skin: warm and dry with no masses, lesions, or rashes Neuro: Cranial nerves 2-12 grossly intact, sensation is normal throughout Psych: A&Ox3 with an appropriate affect.   Results for  orders placed or performed during the hospital encounter of 05/12/20 (from the past 48 hour(s))  Urinalysis, Routine w reflex microscopic Urine, Clean Catch     Status: Abnormal   Collection Time: 05/12/20  3:54 PM  Result Value Ref Range   Color, Urine YELLOW YELLOW   APPearance CLOUDY (A) CLEAR   Specific Gravity, Urine 1.030 1.005 - 1.030   pH 6.0 5.0 - 8.0   Glucose, UA NEGATIVE NEGATIVE mg/dL   Hgb urine dipstick SMALL (A) NEGATIVE   Bilirubin Urine NEGATIVE NEGATIVE   Ketones, ur NEGATIVE NEGATIVE mg/dL   Protein, ur NEGATIVE NEGATIVE mg/dL   Nitrite NEGATIVE NEGATIVE   Leukocytes,Ua NEGATIVE NEGATIVE    Comment: Performed at [facility], [address] [place], [place] [address]  Urinalysis, Microscopic (reflex)     Status: Abnormal   Collection Time: 05/12/20  3:54 PM  Result Value Ref Range   RBC / HPF 6-10 0 - 5 RBC/hpf   WBC,  UA 0-5 0 - 5 WBC/hpf   Bacteria, UA MANY (A) NONE SEEN   Squamous Epithelial / LPF 11-20 0 - 5    Comment: Performed at [facility], [address] [place], [place] [address]  CBC with Differential     Status: Abnormal   Collection Time: 05/12/20 11:21 PM  Result Value Ref Range   WBC 14.7 (H) 4.0 - 10.5 K/uL   RBC 4.54 3.87 - 5.11 MIL/uL   Hemoglobin 13.8 12.0 - 15.0 g/dL   HCT 40.5 36.0 - 46.0 %   MCV 89.2 80.0 - 100.0 fL   MCH 30.4 26.0 - 34.0 pg   MCHC 34.1 30.0 - 36.0 g/dL   RDW 14.4 11.5 - 15.5 %   Platelets 273 150 - 400 K/uL   nRBC 0.0 0.0 - 0.2 %   Neutrophils Relative % 83 %   Neutro Abs 12.1 (H) 1.7 - 7.7 K/uL   Lymphocytes Relative 12 %   Lymphs Abs 1.7 0.7 - 4.0 K/uL   Monocytes Relative 5 %   Monocytes Absolute 0.7 0.1 - 1.0 K/uL   Eosinophils Relative 0 %   Eosinophils Absolute 0.0 0.0 - 0.5 K/uL   Basophils Relative 0 %   Basophils Absolute 0.0 0.0 - 0.1 K/uL   Immature Granulocytes 0 %   Abs Immature Granulocytes 0.06 0.00 - 0.07 K/uL    Comment: Performed at [facility], [address] [place], [place] [address]  SARS CORONAVIRUS 2 (TAT 6-24 HRS) Nasopharyngeal Nasopharyngeal Swab     Status: None   Collection Time: 05/13/20 12:33 AM   Specimen: Nasopharyngeal Swab  Result Value Ref Range   SARS Coronavirus 2 NEGATIVE NEGATIVE    Comment: (NOTE) SARS-CoV-2 target nucleic acids are NOT DETECTED.  The SARS-CoV-2 RNA is generally detectable in upper and lower respiratory specimens during the acute phase of infection. Negative results do not preclude SARS-CoV-2 infection, do not rule out co-infections with other pathogens, and should not be used as the sole basis for treatment or other patient management decisions. Negative results must be combined with clinical observations, patient history, and epidemiological information. The expected result is Negative.  Fact Sheet for  Patients: SugarRoll.be  Fact Sheet for Healthcare Providers: https://www.woods-mathews.com/  This test is not yet approved or cleared by the [place] FDA and  has been authorized for detection and/or diagnosis of SARS-CoV-2 by FDA under an Emergency Use Authorization (EUA). This EUA will remain  in  effect (meaning this test can be used) for the duration of the COVID-19 declaration under Se ction 564(b)(1) of the Act, 21 U.S.C. section 360bbb-3(b)(1), unless the authorization is terminated or revoked sooner.  Performed at [facility], [place] [address] [place], [place] [address]   CBC     Status: Abnormal   Collection Time: 05/13/20  9:05 AM  Result Value Ref Range   WBC 12.4 (H) 4.0 - 10.5 K/uL   RBC 4.15 3.87 - 5.11 MIL/uL   Hemoglobin 12.5 12.0 - 15.0 g/dL   HCT 37.9 36.0 - 46.0 %   MCV 91.3 80.0 - 100.0 fL   MCH 30.1 26.0 - 34.0 pg   MCHC 33.0 30.0 - 36.0 g/dL   RDW 14.4 11.5 - 15.5 %   Platelets 249 150 - 400 K/uL   nRBC 0.0 0.0 - 0.2 %    Comment: Performed at [facility], [place] [address] [place], [place] [address]   CT ABDOMEN PELVIS W CONTRAST  Result Date: 05/13/2020 CLINICAL DATA:  Abdominal pain nausea vomiting EXAM: CT ABDOMEN AND PELVIS WITH CONTRAST TECHNIQUE: Multidetector CT imaging of the abdomen and pelvis was performed using the standard protocol following bolus administration of intravenous contrast. CONTRAST:  156m OMNIPAQUE IOHEXOL 300 MG/ML  SOLN COMPARISON:  None. FINDINGS: Lower chest: The visualized heart size within normal limits. No pericardial fluid/thickening. No hiatal hernia. The visualized portions of the lungs are clear. Hepatobiliary: The liver is normal in density without focal abnormality.The main portal vein is patent. No evidence of calcified gallstones, gallbladder wall thickening or biliary dilatation. Pancreas: Unremarkable. No pancreatic ductal dilatation or surrounding  inflammatory changes. Spleen: Normal in size without focal abnormality. Adrenals/Urinary Tract: Both adrenal glands appear normal. The kidneys and collecting system appear normal without evidence of urinary tract calculus or hydronephrosis. Bladder is unremarkable. Stomach/Bowel: The stomach and small bowel are normal appearance. There is a focal segment of sigmoid colon with diverticula surrounding mesenteric fat stranding changes. There is tiny foci of free air seen posteriorly, series 2, image 62. There is also a small loculated fluid collection seen posteriorly measuring 2.3 x 1.3 cm. Vascular/Lymphatic: There are no enlarged mesenteric, retroperitoneal, or pelvic lymph nodes. No significant vascular findings are present. Reproductive: Bilateral tubal ligation clips are noted. Within the left adnexa there is a 3.7 cm hypodense lesion, likely left ovarian cyst. An within the right adnexa there is a 3.5 cm low-density lesion, likely right ovarian cyst. Other: No evidence of abdominal wall mass or hernia. A small amount of free fluid is seen within the deep pelvis. Musculoskeletal: No acute or significant osseous findings. IMPRESSION: 1. Focal sigmoid colonic diverticulitis with findings consistent with focal contained perforation and a small pericolonic abscess measuring 2.3 x 1.3 cm. 2. Bilateral ovarian cysts the largest measuring 3.7 cm within the left ovary. Electronically Signed   By: BPrudencio PairM.D.   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  I would let [facility] know she has been readmitted.  The aim  would be to allow her to get over this third bout of diverticulitis with medical management.  She will still need a colonoscopy early next year, and then evaluation for possible sigmoid colectomy.  If she develops a progressively worse abscess during this hospitalization, she may require repeat CT, IR intervention, or possibly surgery this hospitalization.  Earnstine Regal [facility] 05/13/2020, 9:49 AM Please see Amion for pager number during day hours 7:00am-4:30pm

## 2020-05-13 NOTE — Telephone Encounter (Signed)
The pt has been scheduled for follow up on 06/22/20 at 2:50 pm with Dr Meridee Score

## 2020-05-14 DIAGNOSIS — F319 Bipolar disorder, unspecified: Secondary | ICD-10-CM | POA: Diagnosis not present

## 2020-05-14 DIAGNOSIS — J449 Chronic obstructive pulmonary disease, unspecified: Secondary | ICD-10-CM | POA: Diagnosis not present

## 2020-05-14 DIAGNOSIS — K5780 Diverticulitis of intestine, part unspecified, with perforation and abscess without bleeding: Secondary | ICD-10-CM | POA: Diagnosis not present

## 2020-05-14 DIAGNOSIS — K5720 Diverticulitis of large intestine with perforation and abscess without bleeding: Secondary | ICD-10-CM

## 2020-05-14 DIAGNOSIS — Z720 Tobacco use: Secondary | ICD-10-CM

## 2020-05-14 DIAGNOSIS — B3731 Acute candidiasis of vulva and vagina: Secondary | ICD-10-CM | POA: Diagnosis not present

## 2020-05-14 DIAGNOSIS — B373 Candidiasis of vulva and vagina: Secondary | ICD-10-CM | POA: Diagnosis not present

## 2020-05-14 DIAGNOSIS — K5732 Diverticulitis of large intestine without perforation or abscess without bleeding: Secondary | ICD-10-CM

## 2020-05-14 LAB — BASIC METABOLIC PANEL
Anion gap: 8 (ref 5–15)
BUN: 7 mg/dL (ref 6–20)
CO2: 27 mmol/L (ref 22–32)
Calcium: 8.3 mg/dL — ABNORMAL LOW (ref 8.9–10.3)
Chloride: 105 mmol/L (ref 98–111)
Creatinine, Ser: 0.97 mg/dL (ref 0.44–1.00)
GFR, Estimated: >60 mL/min (ref >60)
Glucose, Bld: 88 mg/dL (ref 70–99)
Potassium: 3.5 mmol/L (ref 3.5–5.1)
Sodium: 140 mmol/L (ref 135–145)

## 2020-05-14 LAB — CBC
HCT: 37.2 % (ref 36.0–46.0)
Hemoglobin: 12.3 g/dL (ref 12.0–15.0)
MCH: 30.9 pg (ref 26.0–34.0)
MCHC: 33.1 g/dL (ref 30.0–36.0)
MCV: 93.5 fL (ref 80.0–100.0)
Platelets: 230 10*3/uL (ref 150–400)
RBC: 3.98 MIL/uL (ref 3.87–5.11)
RDW: 14 % (ref 11.5–15.5)
WBC: 9.7 10*3/uL (ref 4.0–10.5)
nRBC: 0 % (ref 0.0–0.2)

## 2020-05-14 LAB — MAGNESIUM: Magnesium: 2.1 mg/dL (ref 1.7–2.4)

## 2020-05-14 MED ORDER — FLUCONAZOLE 150 MG PO TABS
150.0000 mg | ORAL_TABLET | Freq: Once | ORAL | Status: AC
  Administered 2020-05-14: 150 mg via ORAL
  Filled 2020-05-14: qty 1

## 2020-05-14 MED ORDER — CLOTRIMAZOLE 1 % VA CREA
1.0000 | TOPICAL_CREAM | Freq: Every day | VAGINAL | Status: DC
  Administered 2020-05-14: 1 via VAGINAL
  Filled 2020-05-14: qty 45

## 2020-05-14 MED ORDER — FLUCONAZOLE 150 MG PO TABS
150.0000 mg | ORAL_TABLET | Freq: Every day | ORAL | Status: DC
Start: 2020-05-14 — End: 2020-05-14

## 2020-05-14 MED ORDER — SODIUM CHLORIDE 0.9 % IV SOLN: INTRAVENOUS | Status: DC

## 2020-05-14 MED ORDER — POTASSIUM CHLORIDE CRYS ER 20 MEQ PO TBCR
40.0000 meq | EXTENDED_RELEASE_TABLET | Freq: Once | ORAL | Status: AC
  Administered 2020-05-14: 40 meq via ORAL
  Filled 2020-05-14: qty 2

## 2020-05-14 NOTE — Progress Notes (Signed)
PROGRESS NOTE    Beverly Wong  UYQ:[id number] DOB: 1980/12/07 DOA: 05/12/2020 PCP: [name], MD    Chief Complaint  Patient presents with  . Abdominal Pain    Brief Narrative:  HPI per Dr. [name] This is a 39 year old female with past medical history of bipolar disorder, sigmoid diverticulitis with microperforation, schizophrenia, tobacco use, COPD, OSA who presented to [facility] on 12/29 for abdominal pain which occurred after taking prep for a colonoscopy which was scheduled for 12/29 with Dr. [name].  Initially went to [facility] and had labs drawn but left due to long wait times and went to [facility]. Colonoscopy initially ordered due to an episode of diverticulitis about 3 months ago.  Patient contacted her gastroenterologist on 12/29 AM due to vomiting and left lower quadrant abdominal pain radiating to suprapubic area after starting colon prep the evening of 12/28.  She was advised to proceed to the ED for imaging to rule out diverticulitis.  Associated with subjective fever and chills and sweats.  Nothing makes it better or worse.  States that she did have a nonbloody BM last night with mucus and has only urinated once in the past 24 hours. No other complaints   ED Course: Afebrile, hemodynamically stable, on room air. Notable Labs: Sodium 141, K3.8, BUN 8, creatinine 0.83, alk phos 64, AST 15, ALT 10, WBC 19.5-> 14.7, Hb 14.3, platelets 300, UA contaminated, COVID-19 negative. Notable Imaging: CT abdomen pelvis-focal sigmoid colonic diverticulitis with findings consistent with focal contained perforation and small pericolonic abscess measuring 2.3 x 1.3 cm and bilateral ovarian cysts. 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  CT abdomen and pelvis which was done concerning for focal sigmoid colonic diverticulitis with contained perforation and small pericolonic abscess measuring 2.3 x 1.3 cm and bilateral ovarian cyst.  Patient noted to have had diverticulitis in September 2021, October 2021 and currently in December 2021.  With clinical improvement.  Continue IV Zosyn.  Patient seen in consultation by general surgery who are recommending repeat CT abdomen and pelvis in about 48 to 72 hours and noted that patient will likely need surgery either during this hospitalization or as an outpatient.  Patient tolerating clears.  Advance to full liquid diet.  Per general surgery.  2.  Bipolar disorder/schizophrenia Stable.  Patient noted to be on IM Abilify monthly.  Outpatient follow-up.  3.  Vaginal candidiasis Patient complaining of a yeast infection.  Diflucan 150 mg p.o. x1.  Clotrimazole vaginal cream nightly x7 days.  4.  Tobacco abuse Tobacco cessation.  5.  COPD Stable.  Tobacco cessation.  Albuterol MDI as needed.  Follow.   DVT prophylaxis: Lovenox Code Status: Full Family Communication: Updated patient.  No family at bedside Disposition:   Status is: Inpatient    Dispo: The patient is from: Home              Anticipated d/c is to: Home              Anticipated d/c date is: 2 to 3 days  Patient currently on IV antibiotics.  Not stable for discharge.       Consultants:   General surgery: Dr. Donne Hazel 05/13/2020  Procedures:   CT abdomen and pelvis 05/13/2020  Antimicrobials:   IV Zosyn 05/13/2020>>>>     Subjective: Patient laying in bed.  Denies any chest pain or shortness of breath.  States abdominal pain has improved.  Tolerated clears.  Patient complaining of yeast  infection.  Objective: Vitals:   05/13/20 2113 05/14/20 0156 05/14/20 0542 05/14/20 0945  BP: 111/62 100/69 100/62 110/70  Pulse: 69 62 83 (!) 57  Resp: _0 Temp: 99.1 F (37.3 C) 98.8 F (37.1 C) 98.8 F (37.1 C) 98.6 F (37 C)  TempSrc: Oral Oral Oral   SpO2: 98% 100% 98% 98%  Weight:        Intake/Output Summary (Last 24 hours) at 05/14/2020 1127 Last data filed at 05/13/2020 1600 Gross per 24 hour  Intake 1012.3 ml  Output --  Net 1012.3 ml   Filed Weights   05/12/20 1552  Weight: 93.4 kg    Examination:  General exam: Appears calm and comfortable  Respiratory system: Clear to auscultation. Respiratory effort normal. Cardiovascular system: S1 & S2 heard, RRR. No JVD, murmurs, rubs, gallops or clicks. No pedal edema. Gastrointestinal system: Abdomen is nondistended, soft and decreasing tenderness to palpation suprapubic and left lower quadrant.  Positive bowel sounds.  Central nervous system: Alert and oriented. No focal neurological deficits. Extremities: Symmetric 5 x 5 power. Skin: No rashes, lesions or ulcers Psychiatry: Judgement and insight appear normal. Mood & affect appropriate.     Data Reviewed: I have personally reviewed following labs and imaging studies  CBC: Recent Labs  Lab 05/12/20 1210 05/12/20 2321 05/13/20 0905 05/14/20 0509  WBC 19.5* 14.7* 12.4* 9.7  NEUTROABS  --  12.1*  --   --   HGB 14.3 13.8 12.5 12.3  HCT 41.9 40.5 37.9 37.2  MCV 89.9 89.2 91.3 93.5  PLT 300 273 249 989    Basic Metabolic Panel: Recent Labs  Lab 05/12/20 1210 05/13/20 0905 05/14/20 0509 05/14/20 0842  NA 141 140 140  --   K 3.8 3.3* 3.5  --   CL 107 106 105  --   CO2 _1 --   GLUCOSE 107* 110* 88  --   BUN _2 --   CREATININE 0.83 0.89 0.97  --   CALCIUM 9.3 8.4* 8.3*  --   MG  --   --   --  2.1    GFR: Estimated Creatinine Clearance: 93.1 mL/min (by C-G formula based on SCr of 0.97 mg/dL).  Liver Function Tests: Recent  Labs  Lab 05/12/20 1210  AST 15  ALT 10  ALKPHOS 64  BILITOT 0.8  PROT 7.9  ALBUMIN 4.3    CBG: No results for input(s): GLUCAP in the last 168 hours.   Recent Results (from the past 240 hour(s))  SARS CORONAVIRUS 2 (TAT 6-24 HRS) Nasopharyngeal Nasopharyngeal Swab     Status: None   Collection Time: 05/13/20 12:33 AM   Specimen: Nasopharyngeal Swab  Result Value Ref Range Status   SARS Coronavirus 2 NEGATIVE NEGATIVE Final    Comment: (NOTE) SARS-CoV-2 target nucleic acids are NOT DETECTED.  The SARS-CoV-2 RNA is generally detectable in upper and lower respiratory specimens during the acute phase of infection. Negative results do not preclude SARS-CoV-2 infection, do not rule out co-infections with other pathogens, and  should not be used as the sole basis for treatment or other patient management decisions. Negative results must be combined with clinical observations, patient history, and epidemiological information. The expected result is Negative.  Fact Sheet for Patients: SugarRoll.be  Fact Sheet for Healthcare Providers: https://www.woods-mathews.com/  This test is not yet approved or cleared by the [place] FDA and  has been authorized for detection and/or diagnosis of SARS-CoV-2 by FDA under an Emergency Use Authorization (EUA). This EUA will remain  in effect (meaning this test can be used) for the duration of the COVID-19 declaration under Se ction 564(b)(1) of the Act, 21 U.S.C. section 360bbb-3(b)(1), unless the authorization is terminated or revoked sooner.  Performed at [facility], [place] [address] [place], [place] [address]          Radiology Studies: CT ABDOMEN PELVIS W CONTRAST  Result Date: 05/13/2020 CLINICAL DATA:  Abdominal pain nausea vomiting EXAM: CT ABDOMEN AND PELVIS WITH CONTRAST TECHNIQUE: Multidetector CT imaging of the abdomen and pelvis was performed using the standard  protocol following bolus administration of intravenous contrast. CONTRAST:  166m OMNIPAQUE IOHEXOL 300 MG/ML  SOLN COMPARISON:  None. FINDINGS: Lower chest: The visualized heart size within normal limits. No pericardial fluid/thickening. No hiatal hernia. The visualized portions of the lungs are clear. Hepatobiliary: The liver is normal in density without focal abnormality.The main portal vein is patent. No evidence of calcified gallstones, gallbladder wall thickening or biliary dilatation. Pancreas: Unremarkable. No pancreatic ductal dilatation or surrounding inflammatory changes. Spleen: Normal in size without focal abnormality. Adrenals/Urinary Tract: Both adrenal glands appear normal. The kidneys and collecting system appear normal without evidence of urinary tract calculus or hydronephrosis. Bladder is unremarkable. Stomach/Bowel: The stomach and small bowel are normal appearance. There is a focal segment of sigmoid colon with diverticula surrounding mesenteric fat stranding changes. There is tiny foci of free air seen posteriorly, series 2, image 62. There is also a small loculated fluid collection seen posteriorly measuring 2.3 x 1.3 cm. Vascular/Lymphatic: There are no enlarged mesenteric, retroperitoneal, or pelvic lymph nodes. No significant vascular findings are present. Reproductive: Bilateral tubal ligation clips are noted. Within the left adnexa there is a 3.7 cm hypodense lesion, likely left ovarian cyst. An within the right adnexa there is a 3.5 cm low-density lesion, likely right ovarian cyst. Other: No evidence of abdominal wall mass or hernia. A small amount of free fluid is seen within the deep pelvis. Musculoskeletal: No acute or significant osseous findings. IMPRESSION: 1. Focal sigmoid colonic diverticulitis with findings consistent with focal contained perforation and a small pericolonic abscess measuring 2.3 x 1.3 cm. 2. Bilateral ovarian cysts the largest measuring 3.7 cm within the left  ovary. Electronically Signed   By: BPrudencio PairM.D.   On: 05/13/2020 00:20        Scheduled Meds: . enoxaparin (LOVENOX) injection  40 mg Subcutaneous Q24H  . potassium chloride  40 mEq Oral Once   Continuous Infusions: . sodium chloride    . piperacillin-tazobactam (ZOSYN)  IV 3.375 g (05/14/20 0522)     LOS: 1 day    Time spent: 35 minutes    DIrine Seal MD [facility]   To contact the attending provider between 7A-7P or the covering provider during after hours 7P-7A, please log into the web site [email or web address] and access using universal [place] password for that web site. If you do not have the password, please call the hospital operator.  05/14/2020, 11:27 AM

## 2020-05-14 NOTE — Progress Notes (Signed)
Subjective No acute events. Feeling better today than yesterday. Some nausea last night. No emesis.  Objective: Vital signs in last 24 hours: Temp:  [98.1 F (36.7 C)-99.1 F (37.3 C)] 98.8 F (37.1 C) (12/31 0542) Pulse Rate:  [62-85] 83 (12/31 0542) Resp:  [15-18] 18 (12/31 0542) BP: (100-114)/(62-70) 100/62 (12/31 0542) SpO2:  [97 %-100 %] 98 % (12/31 0542) Last BM Date: 05/11/20 (from coloscopy prep)  Intake/Output from previous day: 12/30 0701 - 12/31 0700 In: 1012.3 [P.O.:240; I.V.:722.3; IV Piggyback:50] Out: -  Intake/Output this shift: No intake/output data recorded.  Gen: NAD, comfortable CV: RRR Pulm: Normal work of breathing Abd: Quite soft, minimal LLQ tenderness; no rebound nor guarding Ext: SCDs in place  Lab Results: CBC  Recent Labs    05/13/20 0905 05/14/20 0509  WBC 12.4* 9.7  HGB 12.5 12.3  HCT 37.9 37.2  PLT 249 230   BMET Recent Labs    05/13/20 0905 05/14/20 0509  NA 140 140  K 3.3* 3.5  CL 106 105  CO2 26 27  GLUCOSE 110* 88  BUN 9 7  CREATININE 0.89 0.97  CALCIUM 8.4* 8.3*   PT/INR No results for input(s): LABPROT, INR in the last 72 hours. ABG No results for input(s): PHART, HCO3 in the last 72 hours.  Invalid input(s): PCO2, PO2  Studies/Results:  Anti-infectives: Anti-infectives (From admission, onward)   Start     Dose/Rate Route Frequency Ordered Stop   05/13/20 1000  piperacillin-tazobactam (ZOSYN) IVPB 3.375 g        3.375 g 12.5 mL/hr over 240 Minutes Intravenous Every 8 hours 05/13/20 0115     05/13/20 0045  piperacillin-tazobactam (ZOSYN) IVPB 3.375 g        3.375 g 100 mL/hr over 30 Minutes Intravenous  Once 05/13/20 0030 05/13/20 0101       Assessment/Plan: Patient Active Problem List   Diagnosis Date Noted  . 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 Tobacco abuse 05/13/2020  . Sigmoid diverticulitis 01/26/2020  . Intraabdominal fluid collection 01/26/2020  . COPD  with chronic bronchitis (HCC) 01/26/2020  . Bipolar affective (HCC) 01/26/2020  . Abnormal uterine bleeding (AUB) 03/04/2018  . Sleep apnea 02/01/2016  . COPD exacerbation (HCC) 02/01/2016  . Opioid use disorder, mild, on maintenance therapy, abuse (HCC) 02/01/2016  . Snorings 02/01/2016  . Cervical radiculopathy 06/21/2015    -Soft diet as tolerated -Continue IV abx today   LOS: 1 day   Marin Olp, MD [facility] Use [email or web address] to contact on call provider

## 2020-05-15 DIAGNOSIS — K5720 Diverticulitis of large intestine with perforation and abscess without bleeding: Secondary | ICD-10-CM | POA: Diagnosis not present

## 2020-05-15 DIAGNOSIS — F319 Bipolar disorder, unspecified: Secondary | ICD-10-CM | POA: Diagnosis not present

## 2020-05-15 DIAGNOSIS — J449 Chronic obstructive pulmonary disease, unspecified: Secondary | ICD-10-CM | POA: Diagnosis not present

## 2020-05-15 DIAGNOSIS — K5732 Diverticulitis of large intestine without perforation or abscess without bleeding: Secondary | ICD-10-CM | POA: Diagnosis not present

## 2020-05-15 LAB — BASIC METABOLIC PANEL
Anion gap: 6 (ref 5–15)
BUN: <5 mg/dL — ABNORMAL LOW (ref 6–20)
CO2: 26 mmol/L (ref 22–32)
Calcium: 8.2 mg/dL — ABNORMAL LOW (ref 8.9–10.3)
Chloride: 108 mmol/L (ref 98–111)
Creatinine, Ser: 0.96 mg/dL (ref 0.44–1.00)
GFR, Estimated: >60 mL/min (ref >60)
Glucose, Bld: 101 mg/dL — ABNORMAL HIGH (ref 70–99)
Potassium: 3.4 mmol/L — ABNORMAL LOW (ref 3.5–5.1)
Sodium: 140 mmol/L (ref 135–145)

## 2020-05-15 LAB — CBC
HCT: 35.2 % — ABNORMAL LOW (ref 36.0–46.0)
Hemoglobin: 11.4 g/dL — ABNORMAL LOW (ref 12.0–15.0)
MCH: 30 pg (ref 26.0–34.0)
MCHC: 32.4 g/dL (ref 30.0–36.0)
MCV: 92.6 fL (ref 80.0–100.0)
Platelets: 248 10*3/uL (ref 150–400)
RBC: 3.8 MIL/uL — ABNORMAL LOW (ref 3.87–5.11)
RDW: 14.1 % (ref 11.5–15.5)
WBC: 8.5 10*3/uL (ref 4.0–10.5)
nRBC: 0 % (ref 0.0–0.2)

## 2020-05-15 LAB — MAGNESIUM: Magnesium: 2.1 mg/dL (ref 1.7–2.4)

## 2020-05-15 MED ORDER — AMOXICILLIN-POT CLAVULANATE 875-125 MG PO TABS
1.0000 | ORAL_TABLET | Freq: Two times a day (BID) | ORAL | Status: DC
  Administered 2020-05-15: 1 via ORAL
  Filled 2020-05-15: qty 1

## 2020-05-15 MED ORDER — HYDROCODONE-ACETAMINOPHEN 5-325 MG PO TABS: 1.0000 | ORAL_TABLET | ORAL | 0 refills | Status: DC | PRN

## 2020-05-15 MED ORDER — POLYETHYLENE GLYCOL 3350 17 G PO PACK: 17.0000 g | PACK | Freq: Every day | ORAL | 0 refills | Status: AC | PRN

## 2020-05-15 MED ORDER — CLOTRIMAZOLE 1 % VA CREA: 1.0000 | TOPICAL_CREAM | Freq: Every day | VAGINAL | 0 refills | Status: AC

## 2020-05-15 MED ORDER — AMOXICILLIN-POT CLAVULANATE 875-125 MG PO TABS: 1.0000 | ORAL_TABLET | Freq: Two times a day (BID) | ORAL | 0 refills | Status: DC

## 2020-05-15 MED ORDER — POTASSIUM CHLORIDE CRYS ER 20 MEQ PO TBCR
40.0000 meq | EXTENDED_RELEASE_TABLET | Freq: Once | ORAL | Status: AC
  Administered 2020-05-15: 09:00:00 40 meq via ORAL
  Filled 2020-05-15: qty 2

## 2020-05-15 NOTE — Progress Notes (Signed)
   Subjective/Chief Complaint: Feels much better, having flatus, tol liquids, not much pain at all   Objective: Vital signs in last 24 hours: Temp:  [97.6 F (36.4 C)-98.6 F (37 C)] 98.1 F (36.7 C) (01/01 0559) Pulse Rate:  [48-69] 57 (01/01 0559) Resp:  [16-17] 16 (01/01 0559) BP: (105-129)/(63-81) 129/81 (01/01 0559) SpO2:  [97 %-100 %] 97 % (01/01 0559) Weight:  [93.4 kg] 93.4 kg (01/01 0559) Last BM Date: 05/11/20 (from coloscopy prep)  Intake/Output from previous day: 12/31 0701 - 01/01 0700 In: 1331 [I.V.:1331] Out: -  Intake/Output this shift: No intake/output data recorded.  GI: soft nt/nd   Lab Results:  Recent Labs    05/14/20 0509 05/15/20 0406  WBC 9.7 8.5  HGB 12.3 11.4*  HCT 37.2 35.2*  PLT 230 248   BMET Recent Labs    05/14/20 0509 05/15/20 0406  NA 140 140  K 3.5 3.4*  CL 105 108  CO2 27 26  GLUCOSE 88 101*  BUN 7 <5*  CREATININE 0.97 0.96  CALCIUM 8.3* 8.2*   PT/INR No results for input(s): LABPROT, INR in the last 72 hours. ABG No results for input(s): PHART, HCO3 in the last 72 hours.  Invalid input(s): PCO2, PO2  Studies/Results: No results found.  Anti-infectives: Anti-infectives (From admission, onward)   Start     Dose/Rate Route Frequency Ordered Stop   05/15/20 1000  amoxicillin-clavulanate (AUGMENTIN) 875-125 MG per tablet 1 tablet        1 tablet Oral Every 12 hours 05/15/20 0832     05/14/20 1400  fluconazole (DIFLUCAN) tablet 150 mg        150 mg Oral  Once 05/14/20 1152 05/14/20 1312   05/14/20 1215  fluconazole (DIFLUCAN) tablet 150 mg  Status:  Discontinued        150 mg Oral Daily 05/14/20 1128 05/14/20 1151   05/13/20 1000  piperacillin-tazobactam (ZOSYN) IVPB 3.375 g  Status:  Discontinued        3.375 g 12.5 mL/hr over 240 Minutes Intravenous Every 8 hours 05/13/20 0115 05/15/20 0832   05/13/20 0045  piperacillin-tazobactam (ZOSYN) IVPB 3.375 g        3.375 g 100 mL/hr over 30 Minutes Intravenous  Once  05/13/20 0030 05/13/20 0101      Assessment/Plan: Recurrent perforated focal sigmoid diverticulitis with 2.3 x 1.3 cm pericolonic abscess -with course I think fine to let have soft diet, not repeat ct scan and send home on a couple weeks of oral abx, f/u in our office as well as GI FEN:NPO/IV fluids ID: [email or web address] 12/30 >> , change to augmentin DVT: lovenox Follow up: [facility]/GI  Beverly Wong 05/15/2020

## 2020-05-15 NOTE — Discharge Instructions (Signed)
Diverticulosis  Diverticulosis is a condition that develops when small pouches (diverticula) form in the wall of the large intestine (colon). The colon is where water is absorbed and stool (feces) is formed. The pouches form when the inside layer of the colon pushes through weak spots in the outer layers of the colon. You may have a few pouches or many of them. The pouches usually do not cause problems unless they become inflamed or infected. When this happens, the condition is called diverticulitis. What are the causes? The cause of this condition is not known. What increases the risk? The following factors may make you more likely to develop this condition:  Being older than age 18. Your risk for this condition increases with age. Diverticulosis is rare among people younger than age 47. By age 73, many people have it.  Eating a low-fiber diet.  Having frequent constipation.  Being overweight.  Not getting enough exercise.  Smoking.  Taking over-the-counter pain medicines, like aspirin and ibuprofen.  Having a family history of diverticulosis. What are the signs or symptoms? In most people, there are no symptoms of this condition. If you do have symptoms, they may include:  Bloating.  Cramps in the abdomen.  Constipation or diarrhea.  Pain in the lower left side of the abdomen. How is this diagnosed? Because diverticulosis usually has no symptoms, it is most often diagnosed during an exam for other colon problems. The condition may be diagnosed by:  Using a flexible scope to examine the colon (colonoscopy).  Taking an X-ray of the colon after dye has been put into the colon (barium enema).  Having a CT scan. How is this treated? You may not need treatment for this condition. Your health care provider may recommend treatment to prevent problems. You may need treatment if you have symptoms or if you previously had diverticulitis. Treatment may include:  Eating a high-fiber  diet.  Taking a fiber supplement.  Taking a live bacteria supplement (probiotic).  Taking medicine to relax your colon. Follow these instructions at home: Medicines  Take over-the-counter and prescription medicines only as told by your health care provider.  If told by your health care provider, take a fiber supplement or probiotic. Constipation prevention Your condition may cause constipation. To prevent or treat constipation, you may need to:  Drink enough fluid to keep your urine pale yellow.  Take over-the-counter or prescription medicines.  Eat foods that are high in fiber, such as beans, whole grains, and fresh fruits and vegetables.  Limit foods that are high in fat and processed sugars, such as fried or sweet foods.  General instructions  Try not to strain when you have a bowel movement.  Keep all follow-up visits as told by your health care provider. This is important. Contact a health care provider if you:  Have pain in your abdomen.  Have bloating.  Have cramps.  Have not had a bowel movement in 3 days. Get help right away if:  Your pain gets worse.  Your bloating becomes very bad.  You have a fever or chills, and your symptoms suddenly get worse.  You vomit.  You have bowel movements that are bloody or black.  You have bleeding from your rectum. Summary  Diverticulosis is a condition that develops when small pouches (diverticula) form in the wall of the large intestine (colon).  You may have a few pouches or many of them.  This condition is most often diagnosed during an exam for other colon  problems.  Treatment may include increasing the fiber in your diet, taking supplements, or taking medicines. This information is not intended to replace advice given to you by your health care provider. Make sure you discuss any questions you have with your health care provider. Document Revised: 11/28/2018 Document Reviewed: 11/28/2018 Elsevier Patient  Education  [place].                Fiber-Restricted (13 grams) Nutrition Therapy  A fiber-restricted diet contains less than 13 grams of fiber daily. Your registered dietitian nutritionist (RDN) or health care provider may suggest you eat less fiber if you have Crohn's disease or ulcerative colitis and are in a flare or are taking prednisone or budesonide medications. You might also be prescribed this diet if you have irritable bowel syndrome with diarrhea or if you are recovering from gastrointestinal surgery. As your symptoms and condition get better, your RDN or health care provider will help you add more fiber to your diet. It's also important to eat enough protein foods while you are on a fiber-restricted diet.  A fiber-restricted diet includes limited amounts of foods that your body cannot digest. This diet should help you slow the movement of food in your intestines and lower the amount and bulk of your stool. It may also help with your diarrhea, stomach pain, gas and bloating.  A fiber-restricted diet may be low in some nutrients, because a variety of foods are limited to reduce symptoms. Take a chewable multivitamin with minerals to make sure you are getting enough nutrients. You might need calcium with vitamin D supplements too if you're not able to eat enough calcium and vitamin D in your diet.  Tips . Eat about 5 to 6 small meals every 3 or 4 hours daily.  . Eat a protein food or dairy product at every meal or snack if your body can tolerate it. See the Foods Recommended table for ideas.  . Avoid acidic, spicy, fried, greasy and high-fat foods.  . You may need to limit foods/beverages that contain:  . Sugar  . Lactose. Try lactose-free products to reduce symptoms of gas or bloating.  . Fructose  . High-fructose corn syrup  . Sugar-free sweeteners such as aspartame, sucralose, or sorbitol  . Caffeine . Do not eat whole grains, seeds, fruit and vegetable peels  or skins, whole nuts, raw vegetables, most raw fruits and the connective tissues of meats.  . If you have a stricture, avoid all whole grains, raw fruits and raw vegetables and switch to a low-fiber diet (less than 8 grams fiber daily).  . Take calcium with vitamin D supplements at a different time than the multivitamin with minerals.  . All vitamin and mineral supplements should be taken with food.  . Choose foods that have been safely handled and prepared to lower your risk of foodborne illness. Talk to your RDN or see the Food Safety Nutrition Therapy handout for more information.   These suggestions help most people with symptoms. However, if your symptoms get worse after eating specific foods on this list, you should stop eating them until you recover.  Foods Recommended These foods are low in fiber and may help your symptoms. However, if your symptoms get worse after eating specific foods on this list, you should stop eating them until you recover Food Group Foods Recommended  Grains Grain foods with less than 2 grams fiber per serving White flour Bread, bagels, rolls, crackers, and pasta made from white  or refined flour Cold or hot cereals made from white or refined flour such as corn flakes, crispy rice, puffed rice, cream of wheat, cream of rice, or refined grits  Protein Foods Tender, well-cooked, lean meats made without added fat: beef, fish, lamb, pork, or poultry Lean deli meats (heated to steaming) Well-cooked eggs Tofu Smooth nut butters: almond, peanut, or sunflower  Dairy If you have lactose intolerance, drinking milk products from cows or goats may make diarrhea worse. Foods marked with an asterisk (*) have lactose. Buttermilk* Fat-free, 1%, and 2% milk* Lactose-free milk Powdered milk and evaporated milk* Fortified non-dairy milks: almond, cashew, coconut, or rice (be aware that these options are not good sources of protein so you will need to eat an additional protein  food) Fortified pea milk and soymilk (may cause gas and bloating in some people) Yogurt* with live active cultures without fruit, granola, or nuts Lactose-free yogurt Kefir (many are 99% lactose-free) Cheese*: cheddar, Swiss, Parmesan (low-fat, block, hard and aged cheese are usually lower in lactose) Low-fat ice cream* Lactose-free ice cream Cottage cheese* Lactose-free cottage cheese  Vegetables See the Foods Not Recommended table for vegetables to avoid Well-cooked vegetables without seeds or skins Potatoes without skin: white, red and yellow Small amounts of sweet potatoes without skin may be added as fiber is increased in the diet Strained vegetable juice  Fruit Fruit juice, except for prune juice Ripe bananas Melons: cantaloupe, honeydew or watermelon Peeled apple; a baked apple will have less fiber than a fresh apple Canned soft fruits in juice, avoid pineapple  Oils Limit fats and oils to less than 8 teaspoons per day. Choose oils (olive, canola) more often than solid fats  Beverages  Healthy people need 8 to 10 cups of fluid each day which mainly is recommended to be plain water; coffee, tea and water with added flavor packets are not included in this recommendation as these often increase symptoms. You may need to drink more to replace fluids lost from diarrhea. Decaffeinated coffee Caffeine-free teas Rehydration beverages    Foods Not Recommended These foods are higher in fat and fiber and may make your symptoms worse. Food Group Foods Not Recommended  Grains Whole wheat or whole grain breads, rolls, crackers, or pasta Brown or wild rice Barley, oats, and other whole grains such as quinoa Cereals made from whole grain or bran such as shredded wheat or bran flakes Breads or cereals made with seeds or nuts Popcorn  Protein Foods Fried meat, Environmental education officer, or fish [employer], such as bologna or salami Sausage and bacon Hot dogs Fatty meats Dried beans and peas;  hummus Nuts and seeds (coconut, chia seeds, flaxseeds) Crunchy nut butters: almond or peanut  Dairy Whole milk* Half-and-half* Cream* Sour cream* Ice cream* Yogurt* with fruit, granola, or nuts  Vegetables All raw vegetables Fried vegetables Cooked beets; broccoli; brussels sprouts; cabbage; cauliflower; collard, mustard, and turnip greens; corn; dried beans; kale; lima beans; mushrooms; okra; onions; potato skins; spinach  Fruit All fresh fruits, except fruits from the Foods Recommended table All dried fruits, including prunes and raisins Fruit juice with pulp Canned fruit in heavy syrup Any fruits sweetened with sorbitol Prune juice  Oils Butter  Beverages  Beverages containing caffeine: regular coffee, regular tea, soda, and energy drinks Limit beverages containing high-fructose corn syrup to 12 ounces per day Avoid beverages sweetened with sorbitol or other sugar substitutes Alcoholic beverages  Others Sugar alcohols such as erythritol, mannitol, sorbitol, and xylitol Sugar substitutes such as aspartame,  and sucralose Honey   Foods marked with an asterisk (*) have lactose.

## 2020-05-15 NOTE — Discharge Summary (Signed)
Physician Discharge Summary  Beverly Wong HRC:[id number] DOB: 20-Dec-1980 DOA: 05/12/2020  PCP: [name], MD  Admit date: 05/12/2020 Discharge date: 05/15/2020  Time spent: 45 minutes  Recommendations for Outpatient Follow-up:  1. Follow-up with Dr. Nadeen Wong, general surgery in 3 weeks. 2. Follow-up with Dr. [name], GI in 2 to 3 weeks.   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 [name] This is a 40 year old female with past medical history of bipolar disorder, sigmoid diverticulitis with microperforation, schizophrenia, tobacco use, COPD, OSA who presented to [facility] on 12/29 for abdominal pain which occurred after taking prep for a colonoscopy which was scheduled for 12/29 with Dr. [name].  Initially went to [facility] and had labs drawn but left due to long wait times and went to [facility]. Colonoscopy initially ordered due to an episode of diverticulitis about 3 months ago.  Patient contacted her gastroenterologist on 12/29 AM due to vomiting and left lower quadrant abdominal pain radiating to suprapubic area after starting colon prep the evening of 12/28.  She was advised to proceed to the ED for imaging to rule out diverticulitis.  Associated with subjective fever and chills and sweats.  Nothing makes it better or worse.  States that she did have a nonbloody BM last night with mucus and has only urinated once in the past 24 hours. No other complaints   ED Course: Afebrile, hemodynamically stable, on room air.  Notable Labs: Sodium 141, K3.8, BUN 8, creatinine 0.83, alk phos 64, AST 15, ALT 10, WBC 19.5-> 14.7, Hb 14.3, platelets 300, UA contaminated, COVID-19 negative. Notable Imaging: CT abdomen pelvis-focal sigmoid colonic diverticulitis with findings consistent with focal contained perforation and small pericolonic abscess measuring 2.3 x 1.3 cm and bilateral ovarian cysts. Patient received morphine, Zofran, LR, Zosyn and was transported to [facility] for further management  Hospital Course:  1 acute recurrent sigmoid diverticulitis with contained perforation and 2.3 x 1.3 cm pericolonic abscess Patient admitted with nausea vomiting worsening abdominal pain.  CT abdomen and pelvis which was done concerning for focal sigmoid colonic diverticulitis with contained perforation and small pericolonic abscess measuring 2.3 x 1.3 cm and bilateral ovarian cyst.  Patient noted to have had diverticulitis in September 2021, October 2021 and currently in December 2021.    Patient initially placed on bowel rest, empiric IV Zosyn. Patient seen in consultation by general surgery who recommended repeat CT abdomen and pelvis in about 48 to 72 hours and noted that patient will likely need surgery either during this hospitalization or as an outpatient.  Patient's diet advanced to clears which patient tolerated.  Patient subsequently placed on full liquids and subsequently a soft diet which he tolerated.  Antibiotics were transitioned to Augmentin.  Patient improved clinically and will be discharged home in stable and improved condition on 2 more weeks of Augmentin.  Patient will follow up with GI general surgery in the outpatient setting.   2.  Bipolar disorder/schizophrenia Stable.  Patient noted to be on IM Abilify monthly.  Outpatient follow-up.  3.  Vaginal candidiasis Patient complaining of a yeast infection.    Status post Diflucan 150 mg p.o. x1.    Patient also placed on clotrimazole vaginal cream nightly x7 days total.  4.   Tobacco abuse Tobacco cessation stressed to patient.  5.  COPD Stable.  Tobacco cessation.  Albuterol MDI as needed.  Follow  Procedures:  CT abdomen and pelvis 05/13/2020   Consultations:  General surgery: Dr. Donne Hazel 05/13/2020   Discharge Exam: Vitals:   05/15/20 0559 05/15/20 1330  BP: 129/81 120/87  Pulse: (!) 57 (!) 54  Resp: 16 16  Temp: 98.1 F (36.7 C) 98 F (36.7 C)  SpO2: 97% 100%    General: NAD Cardiovascular: RRR Respiratory: CTAB  Discharge Instructions   Discharge Instructions    Diet - low sodium heart healthy   Complete by: As directed    Increase activity slowly   Complete by: As directed      Allergies as of 05/15/2020      Reactions   Ibuprofen Other (See Comments)   Can't take it with the Abilify she is on      Medication List    TAKE these medications   albuterol 108 (90 Base) MCG/ACT inhaler Commonly known as: VENTOLIN HFA Inhale 2 puffs into the lungs every 4 (four) hours as needed for wheezing or shortness of breath (cough, shortness of breath or wheezing.).   amoxicillin-clavulanate 875-125 MG tablet Commonly known as: AUGMENTIN Take 1 tablet by mouth every 12 (twelve) hours.   ARIPiprazole ER 400 MG Prsy prefilled syringe Commonly known as: ABILIFY MAINTENA Inject 400 mg into the muscle every 28 (twenty-eight) days.   clotrimazole 1 % vaginal cream Commonly known as: GYNE-LOTRIMIN Place 1 Applicatorful vaginally at bedtime for 6 days.   fluticasone 50 MCG/ACT nasal spray Commonly known as: FLONASE Place 1-2 sprays into both nostrils daily. What changed:   when to take this  reasons to take this   guaiFENesin 100 MG/5ML liquid Commonly known as: ROBITUSSIN Take 200 mg by mouth 3 (three) times daily as needed for cough.   HYDROcodone-acetaminophen 5-325 MG tablet Commonly known as: NORCO/VICODIN Take 1-2 tablets by mouth every 4 (four) hours as needed for moderate pain.   polyethylene glycol 17 g  packet Commonly known as: MIRALAX / GLYCOLAX Take 17 g by mouth daily as needed. What changed:   when to take this  reasons to take this   Sutab 1479-225-188 MG Tabs Generic drug: Sodium Sulfate-Mag Sulfate-KCl Take 1 kit by mouth as directed.      Allergies  Allergen Reactions  . Ibuprofen Other (See Comments)    Can't take it with the Abilify she is on    Follow-up Information    Ileana Roup, MD Follow up in 3 week(s).   Specialty: General Surgery Contact information: [place] [address] [phone]        Mansouraty, Telford Nab., MD Follow up.   Specialties: Gastroenterology, Internal Medicine Why: f/u in 2-3 weeks Contact information: [place] [address] [phone]                The results of significant diagnostics from this hospitalization (including imaging, microbiology, ancillary and laboratory) are listed below for reference.    Significant Diagnostic Studies: CT ABDOMEN PELVIS W CONTRAST  Result Date: 05/13/2020 CLINICAL DATA:  Abdominal pain nausea vomiting EXAM: CT ABDOMEN AND PELVIS WITH CONTRAST TECHNIQUE: Multidetector CT imaging of the abdomen and pelvis was performed  using the standard protocol following bolus administration of intravenous contrast. CONTRAST:  134m OMNIPAQUE IOHEXOL 300 MG/ML  SOLN COMPARISON:  None. FINDINGS: Lower chest: The visualized heart size within normal limits. No pericardial fluid/thickening. No hiatal hernia. The visualized portions of the lungs are clear. Hepatobiliary: The liver is normal in density without focal abnormality.The main portal vein is patent. No evidence of calcified gallstones, gallbladder wall thickening or biliary dilatation. Pancreas: Unremarkable. No pancreatic ductal dilatation or surrounding inflammatory changes. Spleen: Normal in size without focal abnormality. Adrenals/Urinary Tract: Both adrenal glands appear normal. The kidneys and collecting  system appear normal without evidence of urinary tract calculus or hydronephrosis. Bladder is unremarkable. Stomach/Bowel: The stomach and small bowel are normal appearance. There is a focal segment of sigmoid colon with diverticula surrounding mesenteric fat stranding changes. There is tiny foci of free air seen posteriorly, series 2, image 62. There is also a small loculated fluid collection seen posteriorly measuring 2.3 x 1.3 cm. Vascular/Lymphatic: There are no enlarged mesenteric, retroperitoneal, or pelvic lymph nodes. No significant vascular findings are present. Reproductive: Bilateral tubal ligation clips are noted. Within the left adnexa there is a 3.7 cm hypodense lesion, likely left ovarian cyst. An within the right adnexa there is a 3.5 cm low-density lesion, likely right ovarian cyst. Other: No evidence of abdominal wall mass or hernia. A small amount of free fluid is seen within the deep pelvis. Musculoskeletal: No acute or significant osseous findings. IMPRESSION: 1. Focal sigmoid colonic diverticulitis with findings consistent with focal contained perforation and a small pericolonic abscess measuring 2.3 x 1.3 cm. 2. Bilateral ovarian cysts the largest measuring 3.7 cm within the left ovary. Electronically Signed   By: BPrudencio PairM.D.   On: 05/13/2020 00:20    Microbiology: Recent Results (from the past 240 hour(s))  SARS CORONAVIRUS 2 (TAT 6-24 HRS) Nasopharyngeal Nasopharyngeal Swab     Status: None   Collection Time: 05/13/20 12:33 AM   Specimen: Nasopharyngeal Swab  Result Value Ref Range Status   SARS Coronavirus 2 NEGATIVE NEGATIVE Final    Comment: (NOTE) SARS-CoV-2 target nucleic acids are NOT DETECTED.  The SARS-CoV-2 RNA is generally detectable in upper and lower respiratory specimens during the acute phase of infection. Negative results do not preclude SARS-CoV-2 infection, do not rule out co-infections with other pathogens, and should not be used as the sole basis for  treatment or other patient management decisions. Negative results must be combined with clinical observations, patient history, and epidemiological information. The expected result is Negative.  Fact Sheet for Patients: hSugarRoll.be Fact Sheet for Healthcare Providers: hhttps://www.woods-mathews.com/ This test is not yet approved or cleared by the [place] and  has been authorized for detection and/or diagnosis of SARS-CoV-2 by FDA under an Emergency Use Authorization (EUA). This EUA will remain  in effect (meaning this test can be used) for the duration of the COVID-19 declaration under Se ction 564(b)(1) of the Act, 21 U.S.C. section 360bbb-3(b)(1), unless the authorization is terminated or revoked sooner.  Performed at [facility] [address] [place] [address]  Culture, blood (Routine X 2) w Reflex to ID Panel     Status: None (Preliminary result)   Collection Time: 05/14/20  8:42 AM   Specimen: BLOOD  Result Value Ref Range Status   Specimen Description   Final    BLOOD LEFT ANTECUBITAL Performed at [id number] [place] [address]  Special Requests   Final    BOTTLES DRAWN AEROBIC AND ANAEROBIC Blood Culture adequate volume Performed at [facility] [address], [place], [place] [address]    Culture   Final    NO GROWTH < 24 HOURS Performed at [facility] [address] [place], [place] [address]    Report Status PENDING  Incomplete  Culture, blood (Routine X 2) w Reflex to ID Panel     Status: None (Preliminary result)   Collection Time: 05/14/20  8:42 AM   Specimen: BLOOD  Result Value Ref Range Status   Specimen Description   Final    BLOOD BLOOD LEFT HAND Performed at [facility] [address], [place], [place] [address]    Special Requests   Final    BOTTLES DRAWN AEROBIC AND ANAEROBIC Blood  Culture adequate volume Performed at [facility] [address], [place], [place] [address]    Culture   Final    NO GROWTH < 24 HOURS Performed at [facility] [address] [place], [place] [address]    Report Status PENDING  Incomplete     Labs: Basic Metabolic Panel: Recent Labs  Lab 05/12/20 1210 05/13/20 0905 05/14/20 0509 05/14/20 0842 05/15/20 0406  NA 141 140 140  --  140  K 3.8 3.3* 3.5  --  3.4*  CL 107 106 105  --  108  CO2 _0 --  26  GLUCOSE 107* 110* 88  --  101*  BUN _1 --  <5*  CREATININE 0.83 0.89 0.97  --  0.96  CALCIUM 9.3 8.4* 8.3*  --  8.2*  MG  --   --   --  2.1 2.1   Liver Function Tests: Recent Labs  Lab 05/12/20 1210  AST 15  ALT 10  ALKPHOS 64  BILITOT 0.8  PROT 7.9  ALBUMIN 4.3   Recent Labs  Lab 05/12/20 1210  LIPASE 22   No results for input(s): AMMONIA in the last 168 hours. CBC: Recent Labs  Lab 05/12/20 1210 05/12/20 2321 05/13/20 0905 05/14/20 0509 05/15/20 0406  WBC 19.5* 14.7* 12.4* 9.7 8.5  NEUTROABS  --  12.1*  --   --   --   HGB 14.3 13.8 12.5 12.3 11.4*  HCT 41.9 40.5 37.9 37.2 35.2*  MCV 89.9 89.2 91.3 93.5 92.6  PLT 300 273 249 230 248   Cardiac Enzymes: No results for input(s): CKTOTAL, CKMB, CKMBINDEX, TROPONINI in the last 168 hours. BNP: BNP (last 3 results) No results for input(s): BNP in the last 8760 hours.  ProBNP (last 3 results) No results for input(s): PROBNP in the last 8760 hours.  CBG: No results for input(s): GLUCAP in the last 168 hours.     Signed:  Irine Seal MD.  [facility] 05/15/2020, 2:50 PM

## 2020-05-15 NOTE — Plan of Care (Signed)
Instructions were reviewed with patient. All questions were answered. Patient was transported to main entrance by wheelchair. ° °

## 2020-05-15 NOTE — Progress Notes (Signed)
Brief Nutrition Note  Received page from RN on on-call/weekend pager. Consult to dietitian placed at [phone] today for diet education regarding diverticulitis. Per RN, pt is an imminent discharge.  RD has attached "Fiber Restricted (13 grams) Nutrition Therapy" handout from the Academy of [employer] Dietetics to pt's AVS/Discharge Instructions. RN is aware.   Mertie Clause, MS, RD, LDN Inpatient Clinical Dietitian Please see AMiON for contact information.

## 2020-05-19 LAB — CULTURE, BLOOD (ROUTINE X 2)
Culture: NO GROWTH
Culture: NO GROWTH
Special Requests: ADEQUATE
Special Requests: ADEQUATE

## 2020-06-01 ENCOUNTER — Other Ambulatory Visit: Payer: Self-pay

## 2020-06-01 ENCOUNTER — Telehealth: Payer: Self-pay | Admitting: Gastroenterology

## 2020-06-01 MED ORDER — DICYCLOMINE HCL 20 MG PO TABS: 20.0000 mg | ORAL_TABLET | Freq: Four times a day (QID) | ORAL | 0 refills | Status: DC | PRN

## 2020-06-01 MED ORDER — CIPROFLOXACIN HCL 500 MG PO TABS: 500.0000 mg | ORAL_TABLET | Freq: Two times a day (BID) | ORAL | 0 refills | Status: AC

## 2020-06-01 MED ORDER — METRONIDAZOLE 500 MG PO TABS: 500.0000 mg | ORAL_TABLET | Freq: Two times a day (BID) | ORAL | 0 refills | Status: AC

## 2020-06-01 NOTE — Telephone Encounter (Signed)
Spoke with the patient. She is agreeable to this plan of care. Discussed the medications in detail. Discussed soft diet, increase the fluid intake and moved her follow up to 2 weeks from the present 3 weeks. She does not have follow up with the surgeon because he does not want to see her until she has the colonoscopy.

## 2020-06-01 NOTE — Telephone Encounter (Signed)
Patient calls with complaints of LLQ pain. She says this pain is unchanged from when she was in the hospital. She was discharged on antibiotics (Augmentin) that she completed yesterday. She is on daily Miralax. She has not had a bowel movement in 3 days. Denies fever. Patient asks what she should do.  Please advise. Amy Monica Becton is out of the office.

## 2020-06-01 NOTE — Telephone Encounter (Signed)
Yesi the pt saw Amy last. I will send to [facility].

## 2020-06-01 NOTE — Telephone Encounter (Signed)
I am sorry to hear this.  She has evidence based on the recent hospitalization of persistent diverticulitis with a contained perforation.  She can start ciprofloxacin 500 mg twice daily and Flagyl 500 mg twice daily for course of 10 days.  She should be started on Bentyl 20 mg 2-4 times daily for spasms.  Clinic visit with [facility] or one of the other APP's or myself in 2 weeks.  Prepare her that she may need another CT scan if things persist or if pain becomes significant she definitely will need another repeat CT scan. She should be set up for a colonoscopy in 6 weeks. Please ask her if she has followed up with surgery as had been the recommendation during her recent hospitalization as well? Thank you. GM

## 2020-06-03 NOTE — Telephone Encounter (Signed)
Beth, please plan to call pt Monday for update - if sheis not feeling significantly better will need Ct

## 2020-06-07 NOTE — Telephone Encounter (Signed)
Called patient. No answer. Left her a message about the reason for my call. Requested she call me back to give her update and progress.

## 2020-06-08 NOTE — Telephone Encounter (Signed)
Inbound call from patient returning your call.  Also requesting a doctor's note for today if possible; states still not feeling well and did not go to work.

## 2020-06-08 NOTE — Telephone Encounter (Signed)
Called patient back and she states she had a few soft BMs  For a few days and now the last 2 days she has not had any again. Said she is uncomfortable. She stopped taking the Bentyl because it made her throw-up. She is just taking Tylenol for pain, which isn't helping much. Was out of work today with stomach cramps. Wants note for out of work. Will send by My Chart.

## 2020-06-08 NOTE — Progress Notes (Signed)
To Whom it may concern:   Please excuse Beverly Wong DOB:  [birth year]  from work on 06/08/20 because of G.I. issues. She is being treated by our practice. If you have any questions, you can reach me at [phone].  Thank-you, Amy Esterwood [place] [facility]

## 2020-06-09 ENCOUNTER — Other Ambulatory Visit: Payer: Self-pay

## 2020-06-09 DIAGNOSIS — K5732 Diverticulitis of large intestine without perforation or abscess without bleeding: Secondary | ICD-10-CM

## 2020-06-09 DIAGNOSIS — R109 Unspecified abdominal pain: Secondary | ICD-10-CM

## 2020-06-09 NOTE — Telephone Encounter (Signed)
Beverly Wong, Ok to have note for work for yesterday , today , tomorrow if still feeling bad - I think she needs another CT scan abd/pelvis  if not clearly better on current antibiotics- please order. We cannot set up Colonoscopy until we know diverticulitis has resolved. She may need another 4 days of Cipro500 / Flagyl  500 each BID called in also

## 2020-06-09 NOTE — Telephone Encounter (Signed)
Called patient and she said she was feeling just a little better today and that she had to go to work. She states she has 4 days of both antibiotics left and she does not want anymore right now. She agreed to another CT. Scheduled CT-abd/pelvis w/contrast on 06/17/20 at [facility]. Patient to arrive at 7:45am and pick-up 2 bottles on contrast before 06/17/20. Instructions given on My Chart, per patient request

## 2020-06-10 NOTE — Telephone Encounter (Signed)
Ok, thanks.

## 2020-06-15 ENCOUNTER — Ambulatory Visit: Payer: Medicaid Other | Admitting: Physician Assistant

## 2020-06-17 ENCOUNTER — Ambulatory Visit (HOSPITAL_COMMUNITY): Admission: RE | Admit: 2020-06-17 | Payer: Medicaid Other | Source: Ambulatory Visit

## 2020-06-17 ENCOUNTER — Other Ambulatory Visit: Payer: Self-pay

## 2020-06-18 ENCOUNTER — Other Ambulatory Visit: Payer: Self-pay

## 2020-06-18 ENCOUNTER — Encounter (HOSPITAL_COMMUNITY): Payer: Self-pay

## 2020-06-18 ENCOUNTER — Ambulatory Visit (HOSPITAL_COMMUNITY)
Admission: RE | Admit: 2020-06-18 | Discharge: 2020-06-18 | Disposition: A | Discharge status: Home / Self Care | Payer: Medicaid Other | Source: Ambulatory Visit | Attending: Physician Assistant | Admitting: Physician Assistant

## 2020-06-18 DIAGNOSIS — R109 Unspecified abdominal pain: Secondary | ICD-10-CM | POA: Insufficient documentation

## 2020-06-18 MED ORDER — IOHEXOL 9 MG/ML PO SOLN: 500.0000 mL | ORAL | Status: AC

## 2020-06-18 MED ORDER — IOHEXOL 9 MG/ML PO SOLN
ORAL | Status: AC
  Administered 2020-06-18: 500 mL
  Filled 2020-06-18: qty 500

## 2020-06-18 MED ORDER — IOHEXOL 300 MG/ML  SOLN
100.0000 mL | Freq: Once | INTRAMUSCULAR | Status: AC | PRN
  Administered 2020-06-18: 100 mL via INTRAVENOUS

## 2020-06-22 ENCOUNTER — Telehealth: Payer: Self-pay

## 2020-06-22 ENCOUNTER — Encounter: Payer: Self-pay | Admitting: Gastroenterology

## 2020-06-22 ENCOUNTER — Other Ambulatory Visit (INDEPENDENT_AMBULATORY_CARE_PROVIDER_SITE_OTHER): Payer: Medicaid Other

## 2020-06-22 ENCOUNTER — Ambulatory Visit: Payer: Medicaid Other | Admitting: Gastroenterology

## 2020-06-22 ENCOUNTER — Other Ambulatory Visit: Payer: Self-pay

## 2020-06-22 VITALS — BP 100/80 | HR 68 | Ht 67.5 in | Wt 223.0 lb

## 2020-06-22 DIAGNOSIS — D649 Anemia, unspecified: Secondary | ICD-10-CM

## 2020-06-22 DIAGNOSIS — R140 Abdominal distension (gaseous): Secondary | ICD-10-CM

## 2020-06-22 DIAGNOSIS — K5720 Diverticulitis of large intestine with perforation and abscess without bleeding: Secondary | ICD-10-CM | POA: Diagnosis not present

## 2020-06-22 DIAGNOSIS — R109 Unspecified abdominal pain: Secondary | ICD-10-CM | POA: Diagnosis not present

## 2020-06-22 DIAGNOSIS — K5732 Diverticulitis of large intestine without perforation or abscess without bleeding: Secondary | ICD-10-CM

## 2020-06-22 DIAGNOSIS — R194 Change in bowel habit: Secondary | ICD-10-CM | POA: Diagnosis not present

## 2020-06-22 DIAGNOSIS — Z87898 Personal history of other specified conditions: Secondary | ICD-10-CM | POA: Diagnosis not present

## 2020-06-22 DIAGNOSIS — R195 Other fecal abnormalities: Secondary | ICD-10-CM

## 2020-06-22 LAB — CBC
HCT: 41.4 % (ref 36.0–46.0)
Hemoglobin: 13.8 g/dL (ref 12.0–15.0)
MCHC: 33.3 g/dL (ref 30.0–36.0)
MCV: 90.2 fl (ref 78.0–100.0)
Platelets: 287 10*3/uL (ref 150.0–400.0)
RBC: 4.59 Mil/uL (ref 3.87–5.11)
RDW: 15.2 % (ref 11.5–15.5)
WBC: 7.8 10*3/uL (ref 4.0–10.5)

## 2020-06-22 LAB — BASIC METABOLIC PANEL
BUN: 6 mg/dL (ref 6–23)
CO2: 30 mEq/L (ref 19–32)
Calcium: 9.1 mg/dL (ref 8.4–10.5)
Chloride: 104 mEq/L (ref 96–112)
Creatinine, Ser: 0.82 mg/dL (ref 0.40–1.20)
GFR: 89.73 mL/min (ref >60.00)
Glucose, Bld: 83 mg/dL (ref 70–99)
Potassium: 3.8 mEq/L (ref 3.5–5.1)
Sodium: 140 mEq/L (ref 135–145)

## 2020-06-22 LAB — PROTIME-INR
INR: 1.1 ratio — ABNORMAL HIGH (ref 0.8–1.0)
Prothrombin Time: 11.9 s (ref 9.6–13.1)

## 2020-06-22 LAB — IBC + FERRITIN
Ferritin: 59.4 ng/mL (ref 10.0–291.0)
Iron: 123 ug/dL (ref 42–145)
Saturation Ratios: 31.8 % (ref 20.0–50.0)
Transferrin: 276 mg/dL (ref 212.0–360.0)

## 2020-06-22 LAB — HIGH SENSITIVITY CRP: CRP, High Sensitivity: 4.89 mg/L (ref 0.000–5.000)

## 2020-06-22 LAB — FOLATE: Folate: 7.2 ng/mL (ref >5.9)

## 2020-06-22 LAB — SEDIMENTATION RATE: Sed Rate: 13 mm/hr (ref 0–20)

## 2020-06-22 LAB — VITAMIN B12: Vitamin B-12: 162 pg/mL — ABNORMAL LOW (ref 211–911)

## 2020-06-22 MED ORDER — AMOXICILLIN-POT CLAVULANATE 875-125 MG PO TABS: 1.0000 | ORAL_TABLET | Freq: Two times a day (BID) | ORAL | 0 refills | Status: AC

## 2020-06-22 MED ORDER — SUPREP BOWEL PREP KIT 17.5-3.13-1.6 GM/177ML PO SOLN: 1.0000 | ORAL | 0 refills | Status: DC

## 2020-06-22 MED ORDER — ONDANSETRON 8 MG PO TBDP: 8.0000 mg | ORAL_TABLET | Freq: Three times a day (TID) | ORAL | 0 refills | Status: AC | PRN

## 2020-06-22 NOTE — Patient Instructions (Signed)
Your provider has requested that you go to the basement level for lab work before leaving today. Press "B" on the elevator. The lab is located at the first door on the left as you exit the elevator.   We have sent the following medications to your pharmacy for you to pick up at your convenience: Suprep, Zofran   You have been scheduled for an endoscopy and colonoscopy. Please follow the written instructions given to you at your visit today. Please pick up your prep supplies at the pharmacy within the next 1-3 days. If you use inhalers (even only as needed), please bring them with you on the day of your procedure.   Due to recent changes in healthcare laws, you may see the results of your imaging and laboratory studies on MyChart before your provider has had a chance to review them.  We understand that in some cases there may be results that are confusing or concerning to you. Not all laboratory results come back in the same time frame and the provider may be waiting for multiple results in order to interpret others.  Please give [place] 48 hours in order for your provider to thoroughly review all the results before contacting the office for clarification of your results.   Thank you for choosing me and [facility].  Dr. Meridee Score

## 2020-06-22 NOTE — Telephone Encounter (Signed)
Appointment Tuesday 06/29/20 Arrive at 9:15 am

## 2020-06-22 NOTE — Telephone Encounter (Signed)
Patient aware. She will be here this afternoon as well.  Rx to [facility] as requested.

## 2020-06-22 NOTE — Telephone Encounter (Signed)
-----   Message from Sammuel Cooper, PA-C sent at 06/21/2020  2:14 PM EST -----  Liz [place] - thanks -   Beth, please call pt and let her know the CT scan unfortunately shows persistent diverticulitis.  The small areas of abscess do seem to be somewhat improved. Need her to start back on antibiotics, but start Augmentin 875 mg p.o. twice daily x21 days.  She also needs to be set up for surgical consultation, sooner rather than later for persistent diverticulitis with abscess despite outpatient courses of antibiotics.  Please try to get that appointment within the next 2 weeks.  She may have an upcoming appointment with Dr. Meridee Score if so leave her on the schedule for that appointment.  If she does not have a scheduled appointment with him, then I should probably see her in 2 weeks..  She should call for any worsening of symptoms, development of fevers etc.

## 2020-06-22 NOTE — Telephone Encounter (Signed)
This note is transferring from the CT imaging result notes  Called the patient. No answer. Left her a message asking she call me back to discuss the imaging and the need for atb's.  Called [facility] to schedule her an appointment with her surgeon Dr Cliffton Asters. First available appointment is 07/19/20. Spoke with their triage nurse. She has an opening on the schedule with Dr Cliffton Asters on Tuesday 06/29/20 arrive at 9:15 am in the [place] office. [address], [address] , [place]. Called to the patient again. No answer. Left another message asking shew call back and ask to speak with Beth.

## 2020-06-23 NOTE — H&P (View-Only) (Signed)
[place] VISIT   Primary Care Provider [name], MD [address] [place] [address] [phone]  Patient Profile: Beverly Wong is a 40 y.o. female with a pmh significant for bipolar disorder, schizophrenia, prior UTIs, diverticulosis with persistent complicated diverticulitis.  The patient presents to the [facility] for an evaluation and management of problem(s) noted below:  Problem List 1. Diverticulitis of colon with perforation   2. Diverticulitis of colon   3. History of abdominal pain   4. Change in bowel habits   5. Decreased stool caliber   6. Bloating   7. Anemia, unspecified type     History of Present Illness Please see initial consultation note and prior progress notes by PA Esterwood for full details of HPI.  Interval History Since the patient's last clinic visit there was plan for a colonoscopy attempt however in the setting of her drinking her preparation she developed severe abdominal pain to the point that she ended up having to go back into the hospital.  Repeat imaging showed evidence of a contained perforation and a small pericolonic abscess.  She was treated with antibiotics and seen by surgery in the hospital.  She was able to be discharged.  She completed antibiotics.  She had repeat imaging performed last week to follow-up her findings and these have shown persistent signs of a contained perforation but with decreasing fluid and mildly decreased inflammation in the area.  Today, the patient states that her abdominal pain that she had at the time of her preparation has completely abated.  She has no fevers or chills.  She does notice a continued change in her stool caliber with it being thinner in quality than normal.  She is dealing with constipation as well.  She is taking MiraLAX twice daily to help keep things moving.  No other medication changes.  Patient has not seen surgery in follow-up as had  been the plan during her hospitalization.  Patient is not taking significant nonsteroidals or BC/Goody powders.  Patient remains concerned and wonders if she will ever get to a colonoscopy or will need surgery.  GI Review of Systems Positive as above Negative for dysphagia, odynophagia, nausea, vomiting, melena, hematochezia  Review of Systems General: Denies fevers/chills/weight loss unintentionally (lost at time of her hospitalization but has stabilized) Cardiovascular: Denies chest pain/palpitations Pulmonary: Denies shortness of breath Gastroenterological: See HPI Genitourinary: Denies darkened urine Hematological: Denies easy bruising/bleeding Dermatological: Denies jaundice Psychological: Mood is stable   Medications Current Outpatient Medications  Medication Sig Dispense Refill  . albuterol (VENTOLIN HFA) 108 (90 Base) MCG/ACT inhaler Inhale 2 puffs into the lungs every 4 (four) hours as needed for wheezing or shortness of breath (cough, shortness of breath or wheezing.). 18 g 0  . ARIPiprazole ER (ABILIFY MAINTENA) 400 MG PRSY prefilled syringe Inject 400 mg into the muscle every 28 (twenty-eight) days.    . Na Sulfate-K Sulfate-Mg Sulf (SUPREP BOWEL PREP KIT) 17.5-3.13-1.6 GM/177ML SOLN Take 1 kit by mouth as directed. For colonoscopy prep 354 mL 0  . ondansetron (ZOFRAN ODT) 8 MG disintegrating tablet Take 1 tablet (8 mg total) by mouth every 8 (eight) hours as needed for nausea or vomiting. 5 tablet 0  . polyethylene glycol (MIRALAX / GLYCOLAX) 17 g packet Take 17 g by mouth daily as needed. 14 each 0  . amoxicillin-clavulanate (AUGMENTIN) 875-125 MG tablet Take 1 tablet by mouth 2 (two) times daily for 21 days. (Patient not taking: Reported on 06/22/2020)  42 tablet 0   No current facility-administered medications for this visit.    Allergies Allergies  Allergen Reactions  . Ibuprofen Other (See Comments)    Can't take it with the Abilify she is on    Histories Past  Medical History:  Diagnosis Date  . Bipolar disorder ([place])   . Diverticulitis   . Headache   . Schizophrenia ([place])   . Shortness of breath dyspnea    feels short of breath at times even when seated  . UTI (lower urinary tract infection)    Past Surgical History:  Procedure Laterality Date  . ANTERIOR CERVICAL DECOMP/DISCECTOMY FUSION N/A 06/21/2015   Procedure: Cervical six- seven Anterior Cervical Decompression/ Diskectomy/ Fusion;  Surgeon: Kevan Ny Ditty, MD;  Location: [facility];  Service: Neurosurgery;  Laterality: N/A;  C6-7 Anterior cervical decompression/diskectomy/fusion  . DILITATION & CURRETTAGE/HYSTROSCOPY WITH NOVASURE ABLATION N/A 03/04/2018   Procedure: DILATATION & CURETTAGE/HYSTEROSCOPY WITH NOVASURE ABLATION;  Surgeon: Sloan Leiter, MD;  Location: [facility];  Service: Gynecology;  Laterality: N/A;  . TUBAL LIGATION  2009   Social History   Socioeconomic History  . Marital status: Divorced    Spouse name: Not on file  . Number of children: Not on file  . Years of education: Not on file  . Highest education level: Not on file  Occupational History  . Not on file  Tobacco Use  . Smoking status: Current Every Day Smoker    Packs/day: 1.00    Years: 17.00    Pack years: 17.00    Types: Cigarettes, E-cigarettes  . Smokeless tobacco: Never Used  Vaping Use  . Vaping Use: Every day  Substance and Sexual Activity  . Alcohol use: Yes    Comment: occ  . Drug use: Yes    Types: Marijuana  . Sexual activity: Not on file  Other Topics Concern  . Not on file  Social History Narrative  . Not on file   Social Determinants of Health   Financial Resource Strain: Not on file  Food Insecurity: Not on file  Transportation Needs: Not on file  Physical Activity: Not on file  Stress: Not on file  Social Connections: Not on file  Intimate Partner Violence: Not on file   Family History  Problem Relation Age of Onset  . Diabetes Mother   . Kidney cancer Father    . Breast cancer Maternal Aunt   . Colon cancer Neg Hx   . Pancreatic cancer Neg Hx   . Esophageal cancer Neg Hx   . Inflammatory bowel disease Neg Hx   . Liver disease Neg Hx   . Rectal cancer Neg Hx   . Stomach cancer Neg Hx    I have reviewed her medical, social, and family history in detail and updated the electronic medical record as necessary.    PHYSICAL EXAMINATION  BP 100/80 (BP Location: Left Arm, Patient Position: Sitting, Cuff Size: Normal)   Pulse 68   Ht 5' 7.5" (1.715 m) Comment: height measured without shoes  Wt 223 lb (101.2 kg)   BMI 34.41 kg/m  Wt Readings from Last 3 Encounters:  06/22/20 223 lb (101.2 kg)  05/15/20 205 lb 14.6 oz (93.4 kg)  05/12/20 230 lb (104.3 kg)  GEN: NAD, appears stated age, doesn't appear chronically ill PSYCH: Cooperative, without pressured speech EYE: Conjunctivae pink, sclerae anicteric ENT: Masked CV: Nontachycardic RESP: No audible wheezing GI: NABS, soft, very minimal tenderness to palpation in the left lower quadrant, ND,  without rebound or guarding MSK/EXT: No lower extremity edema SKIN: No jaundice NEURO:  Alert & Oriented x 3, no focal deficits   REVIEW OF DATA  I reviewed the following data at the time of this encounter:  GI Procedures and Studies  No relevant studies to review  Laboratory Studies  Reviewed those in epic  Imaging Studies  February 2022 CT abdomen pelvis with contrast IMPRESSION: 1. Persistent diverticulitis in the proximal sigmoid colon with what appears to be contained perforations in the sigmoid mesocolon which overall appear very similar to prior study, although there is less fluid within the small pericolonic abscess when compared to the prior examination. 2. Aortic atherosclerosis.  December 2021 CT abdomen pelvis with contrast IMPRESSION: 1. Focal sigmoid colonic diverticulitis with findings consistent with focal contained perforation and a small pericolonic abscess measuring 2.3  x 1.3 cm. 2. Bilateral ovarian cysts the largest measuring 3.7 cm within the left ovary.  November 2021 CT abdomen pelvis with contrast IMPRESSION: 1. Diverticulitis involving the distal descending/proximal sigmoid colon with a localized/contained perforation in the adjacent mesocolon. 2.  Aortic atherosclerosis (ICD10-I70.0).  August 2021 CT abdomen pelvis with contrast IMPRESSION: 1. Findings consistent with acute sigmoid colon diverticulitis. Small extraluminal gas bubbles adjacent to the inflamed sigmoid colon, concerning for contained perforation. 2. 3.8 cm loculated fluid collection in the posterior pelvis without internal gas or strong rim enhancement to suggest organized abscess at this time.   ASSESSMENT  Ms. Fultz is a 40 y.o. female with a pmh significant for bipolar disorder, schizophrenia, prior UTIs, diverticulosis with persistent complicated diverticulitis.  The patient is seen today for evaluation and management of:  1. Diverticulitis of colon with perforation   2. Diverticulitis of colon   3. History of abdominal pain   4. Change in bowel habits   5. Decreased stool caliber   6. Bloating   7. Anemia, unspecified type    The patient is hemodynamically stable.  Clinically, she seems to be stabilizing from her recent diverticulitis with microperforation.  Her imaging findings are more concerning than her clinical status at this time.  Hopefully, we can continue some antibiotics for a short course and get her colonoscopy completed in the coming weeks.  We will place a referral to colorectal surgery for follow-up.  If the patient preps again and has significant pain or discomfort is not clear to me if she will be able to then undergo a colonoscopy without need for surgical evaluation or management first.  If she gets to the point where surgical management is needed first then we certainly will be happy to move forward with a colonoscopy soon thereafter.  I think the  likelihood of an underlying mass/malignancy causing her issues in this area is much less likely though not impossible.  She does have anemia.  We will plan for an upper endoscopy as well at the time of her procedure.  We will obtain some laboratories today to see if she has an elevation in her white blood cell count.  I will have her initiate Augmentin for now with the hope that we can get her through to her colonoscopy.  Appreciate colorectal surgery evaluation next week.  The risks and benefits of endoscopic evaluation were discussed with the patient; these include but are not limited to the risk of perforation, infection, bleeding, missed lesions, lack of diagnosis, severe illness requiring hospitalization, as well as anesthesia and sedation related illnesses.  The patient is agreeable to proceed.  All patient questions  were answered to the best of my ability, and the patient agrees to the aforementioned plan of action with follow-up as indicated.   PLAN  Laboratories as outlined below Inflammatory markers to be obtained as well Proceed with scheduling diagnostic colonoscopy Proceed with scheduling diagnostic endoscopy Zofran as needed if needed at the time of preparation Surgical referral for further management of contained perforation in setting of complicated diverticulitis   Orders Placed This Encounter  Procedures  . Procedural/ Surgical Case Request: COLONOSCOPY WITH PROPOFOL, ESOPHAGOGASTRODUODENOSCOPY (EGD) WITH PROPOFOL  . CBC  . Basic Metabolic Panel (BMET)  . Sedimentation rate  . CRP High sensitivity  . IBC + Ferritin  . B12  . Folate  . INR/PT  . Ambulatory referral to Gastroenterology    New Prescriptions   NA SULFATE-K SULFATE-MG SULF (SUPREP BOWEL PREP KIT) 17.5-3.13-1.6 GM/177ML SOLN    Take 1 kit by mouth as directed. For colonoscopy prep   ONDANSETRON (ZOFRAN ODT) 8 MG DISINTEGRATING TABLET    Take 1 tablet (8 mg total) by mouth every 8 (eight) hours as needed for  nausea or vomiting.   Modified Medications   No medications on file    Planned Follow Up No follow-ups on file.   Total Time in Face-to-Face and in Coordination of Care for patient including independent/personal interpretation/review of prior testing, medical history, examination, medication adjustment, communicating results with the patient directly, and documentation with the EHR is 30 minutes.   Justice Britain, MD [facility] Advanced Endoscopy Office # [phone]

## 2020-06-23 NOTE — Progress Notes (Signed)
[place] VISIT   Primary Care Provider [name], MD [address] [place] [address] [phone]  Patient Profile: Beverly Wong is a 40 y.o. female with a pmh significant for bipolar disorder, schizophrenia, prior UTIs, diverticulosis with persistent complicated diverticulitis.  The patient presents to the [facility] for an evaluation and management of problem(s) noted below:  Problem List 1. Diverticulitis of colon with perforation   2. Diverticulitis of colon   3. History of abdominal pain   4. Change in bowel habits   5. Decreased stool caliber   6. Bloating   7. Anemia, unspecified type     History of Present Illness Please see initial consultation note and prior progress notes by PA [place] for full details of HPI.  Interval History Since the patient's last clinic visit there was plan for a colonoscopy attempt however in the setting of her drinking her preparation she developed severe abdominal pain to the point that she ended up having to go back into the hospital.  Repeat imaging showed evidence of a contained perforation and a small pericolonic abscess.  She was treated with antibiotics and seen by surgery in the hospital.  She was able to be discharged.  She completed antibiotics.  She had repeat imaging performed last week to follow-up her findings and these have shown persistent signs of a contained perforation but with decreasing fluid and mildly decreased inflammation in the area.  Today, the patient states that her abdominal pain that she had at the time of her preparation has completely abated.  She has no fevers or chills.  She does notice a continued change in her stool caliber with it being thinner in quality than normal.  She is dealing with constipation as well.  She is taking MiraLAX twice daily to help keep things moving.  No other medication changes.  Patient has not seen surgery in follow-up as had  been the plan during her hospitalization.  Patient is not taking significant nonsteroidals or BC/Goody powders.  Patient remains concerned and wonders if she will ever get to a colonoscopy or will need surgery.  GI Review of Systems Positive as above Negative for dysphagia, odynophagia, nausea, vomiting, melena, hematochezia  Review of Systems General: Denies fevers/chills/weight loss unintentionally (lost at time of her hospitalization but has stabilized) Cardiovascular: Denies chest pain/palpitations Pulmonary: Denies shortness of breath Gastroenterological: See HPI Genitourinary: Denies darkened urine Hematological: Denies easy bruising/bleeding Dermatological: Denies jaundice Psychological: Mood is stable   Medications Current Outpatient Medications  Medication Sig Dispense Refill  . albuterol (VENTOLIN HFA) 108 (90 Base) MCG/ACT inhaler Inhale 2 puffs into the lungs every 4 (four) hours as needed for wheezing or shortness of breath (cough, shortness of breath or wheezing.). 18 g 0  . ARIPiprazole ER (ABILIFY MAINTENA) 400 MG PRSY prefilled syringe Inject 400 mg into the muscle every 28 (twenty-eight) days.    . Na Sulfate-K Sulfate-Mg Sulf (SUPREP BOWEL PREP KIT) 17.5-3.13-1.6 GM/177ML SOLN Take 1 kit by mouth as directed. For colonoscopy prep 354 mL 0  . ondansetron (ZOFRAN ODT) 8 MG disintegrating tablet Take 1 tablet (8 mg total) by mouth every 8 (eight) hours as needed for nausea or vomiting. 5 tablet 0  . polyethylene glycol (MIRALAX / GLYCOLAX) 17 g packet Take 17 g by mouth daily as needed. 14 each 0  . amoxicillin-clavulanate (AUGMENTIN) 875-125 MG tablet Take 1 tablet by mouth 2 (two) times daily for 21 days. (Patient not taking: Reported on 06/22/2020)  42 tablet 0   No current facility-administered medications for this visit.    Allergies Allergies  Allergen Reactions  . Ibuprofen Other (See Comments)    Can't take it with the Abilify she is on    Histories Past  Medical History:  Diagnosis Date  . Bipolar disorder ([place])   . Diverticulitis   . Headache   . Schizophrenia ([place])   . Shortness of breath dyspnea    feels short of breath at times even when seated  . UTI (lower urinary tract infection)    Past Surgical History:  Procedure Laterality Date  . ANTERIOR CERVICAL DECOMP/DISCECTOMY FUSION N/A 06/21/2015   Procedure: Cervical six- seven Anterior Cervical Decompression/ Diskectomy/ Fusion;  Surgeon: Kevan Ny Ditty, MD;  Location: [facility];  Service: Neurosurgery;  Laterality: N/A;  C6-7 Anterior cervical decompression/diskectomy/fusion  . DILITATION & CURRETTAGE/HYSTROSCOPY WITH NOVASURE ABLATION N/A 03/04/2018   Procedure: DILATATION & CURETTAGE/HYSTEROSCOPY WITH NOVASURE ABLATION;  Surgeon: Sloan Leiter, MD;  Location: [facility];  Service: Gynecology;  Laterality: N/A;  . TUBAL LIGATION  2009   Social History   Socioeconomic History  . Marital status: Divorced    Spouse name: Not on file  . Number of children: Not on file  . Years of education: Not on file  . Highest education level: Not on file  Occupational History  . Not on file  Tobacco Use  . Smoking status: Current Every Day Smoker    Packs/day: 1.00    Years: 17.00    Pack years: 17.00    Types: Cigarettes, E-cigarettes  . Smokeless tobacco: Never Used  Vaping Use  . Vaping Use: Every day  Substance and Sexual Activity  . Alcohol use: Yes    Comment: occ  . Drug use: Yes    Types: Marijuana  . Sexual activity: Not on file  Other Topics Concern  . Not on file  Social History Narrative  . Not on file   Social Determinants of Health   Financial Resource Strain: Not on file  Food Insecurity: Not on file  Transportation Needs: Not on file  Physical Activity: Not on file  Stress: Not on file  Social Connections: Not on file  Intimate Partner Violence: Not on file   Family History  Problem Relation Age of Onset  . Diabetes Mother   . Kidney cancer Father    . Breast cancer Maternal Aunt   . Colon cancer Neg Hx   . Pancreatic cancer Neg Hx   . Esophageal cancer Neg Hx   . Inflammatory bowel disease Neg Hx   . Liver disease Neg Hx   . Rectal cancer Neg Hx   . Stomach cancer Neg Hx    I have reviewed her medical, social, and family history in detail and updated the electronic medical record as necessary.    PHYSICAL EXAMINATION  BP 100/80 (BP Location: Left Arm, Patient Position: Sitting, Cuff Size: Normal)   Pulse 68   Ht 5' 7.5" (1.715 m) Comment: height measured without shoes  Wt 223 lb (101.2 kg)   BMI 34.41 kg/m  Wt Readings from Last 3 Encounters:  06/22/20 223 lb (101.2 kg)  05/15/20 205 lb 14.6 oz (93.4 kg)  05/12/20 230 lb (104.3 kg)  GEN: NAD, appears stated age, doesn't appear chronically ill PSYCH: Cooperative, without pressured speech EYE: Conjunctivae pink, sclerae anicteric ENT: Masked CV: Nontachycardic RESP: No audible wheezing GI: NABS, soft, very minimal tenderness to palpation in the left lower quadrant, ND,  without rebound or guarding MSK/EXT: No lower extremity edema SKIN: No jaundice NEURO:  Alert & Oriented x 3, no focal deficits   REVIEW OF DATA  I reviewed the following data at the time of this encounter:  GI Procedures and Studies  No relevant studies to review  Laboratory Studies  Reviewed those in epic  Imaging Studies  February 2022 CT abdomen pelvis with contrast IMPRESSION: 1. Persistent diverticulitis in the proximal sigmoid colon with what appears to be contained perforations in the sigmoid mesocolon which overall appear very similar to prior study, although there is less fluid within the small pericolonic abscess when compared to the prior examination. 2. Aortic atherosclerosis.  December 2021 CT abdomen pelvis with contrast IMPRESSION: 1. Focal sigmoid colonic diverticulitis with findings consistent with focal contained perforation and a small pericolonic abscess measuring 2.3  x 1.3 cm. 2. Bilateral ovarian cysts the largest measuring 3.7 cm within the left ovary.  November 2021 CT abdomen pelvis with contrast IMPRESSION: 1. Diverticulitis involving the distal descending/proximal sigmoid colon with a localized/contained perforation in the adjacent mesocolon. 2.  Aortic atherosclerosis (ICD10-I70.0).  August 2021 CT abdomen pelvis with contrast IMPRESSION: 1. Findings consistent with acute sigmoid colon diverticulitis. Small extraluminal gas bubbles adjacent to the inflamed sigmoid colon, concerning for contained perforation. 2. 3.8 cm loculated fluid collection in the posterior pelvis without internal gas or strong rim enhancement to suggest organized abscess at this time.   ASSESSMENT  Beverly Wong is a 40 y.o. female with a pmh significant for bipolar disorder, schizophrenia, prior UTIs, diverticulosis with persistent complicated diverticulitis.  The patient is seen today for evaluation and management of:  1. Diverticulitis of colon with perforation   2. Diverticulitis of colon   3. History of abdominal pain   4. Change in bowel habits   5. Decreased stool caliber   6. Bloating   7. Anemia, unspecified type    The patient is hemodynamically stable.  Clinically, she seems to be stabilizing from her recent diverticulitis with microperforation.  Her imaging findings are more concerning than her clinical status at this time.  Hopefully, we can continue some antibiotics for a short course and get her colonoscopy completed in the coming weeks.  We will place a referral to colorectal surgery for follow-up.  If the patient preps again and has significant pain or discomfort is not clear to me if she will be able to then undergo a colonoscopy without need for surgical evaluation or management first.  If she gets to the point where surgical management is needed first then we certainly will be happy to move forward with a colonoscopy soon thereafter.  I think the  likelihood of an underlying mass/malignancy causing her issues in this area is much less likely though not impossible.  She does have anemia.  We will plan for an upper endoscopy as well at the time of her procedure.  We will obtain some laboratories today to see if she has an elevation in her white blood cell count.  I will have her initiate Augmentin for now with the hope that we can get her through to her colonoscopy.  Appreciate colorectal surgery evaluation next week.  The risks and benefits of endoscopic evaluation were discussed with the patient; these include but are not limited to the risk of perforation, infection, bleeding, missed lesions, lack of diagnosis, severe illness requiring hospitalization, as well as anesthesia and sedation related illnesses.  The patient is agreeable to proceed.  All patient questions  were answered to the best of my ability, and the patient agrees to the aforementioned plan of action with follow-up as indicated.   PLAN  Laboratories as outlined below Inflammatory markers to be obtained as well Proceed with scheduling diagnostic colonoscopy Proceed with scheduling diagnostic endoscopy Zofran as needed if needed at the time of preparation Surgical referral for further management of contained perforation in setting of complicated diverticulitis   Orders Placed This Encounter  Procedures  . Procedural/ Surgical Case Request: COLONOSCOPY WITH PROPOFOL, ESOPHAGOGASTRODUODENOSCOPY (EGD) WITH PROPOFOL  . CBC  . Basic Metabolic Panel (BMET)  . Sedimentation rate  . CRP High sensitivity  . IBC + Ferritin  . B12  . Folate  . INR/PT  . Ambulatory referral to Gastroenterology    New Prescriptions   NA SULFATE-K SULFATE-MG SULF (SUPREP BOWEL PREP KIT) 17.5-3.13-1.6 GM/177ML SOLN    Take 1 kit by mouth as directed. For colonoscopy prep   ONDANSETRON (ZOFRAN ODT) 8 MG DISINTEGRATING TABLET    Take 1 tablet (8 mg total) by mouth every 8 (eight) hours as needed for  nausea or vomiting.   Modified Medications   No medications on file    Planned Follow Up No follow-ups on file.   Total Time in Face-to-Face and in Coordination of Care for patient including independent/personal interpretation/review of prior testing, medical history, examination, medication adjustment, communicating results with the patient directly, and documentation with the EHR is 30 minutes.   Justice Britain, MD [facility] Office # [phone]

## 2020-06-24 ENCOUNTER — Other Ambulatory Visit: Payer: Self-pay

## 2020-06-24 DIAGNOSIS — D649 Anemia, unspecified: Secondary | ICD-10-CM

## 2020-06-25 ENCOUNTER — Telehealth: Payer: Self-pay | Admitting: Physician Assistant

## 2020-06-25 ENCOUNTER — Encounter: Payer: Self-pay | Admitting: Gastroenterology

## 2020-06-25 DIAGNOSIS — R195 Other fecal abnormalities: Secondary | ICD-10-CM | POA: Insufficient documentation

## 2020-06-25 DIAGNOSIS — R194 Change in bowel habit: Secondary | ICD-10-CM | POA: Insufficient documentation

## 2020-06-25 DIAGNOSIS — D649 Anemia, unspecified: Secondary | ICD-10-CM | POA: Insufficient documentation

## 2020-06-25 DIAGNOSIS — K5732 Diverticulitis of large intestine without perforation or abscess without bleeding: Secondary | ICD-10-CM | POA: Insufficient documentation

## 2020-06-25 DIAGNOSIS — R140 Abdominal distension (gaseous): Secondary | ICD-10-CM | POA: Insufficient documentation

## 2020-06-25 DIAGNOSIS — Z87898 Personal history of other specified conditions: Secondary | ICD-10-CM | POA: Insufficient documentation

## 2020-06-25 DIAGNOSIS — K5720 Diverticulitis of large intestine with perforation and abscess without bleeding: Secondary | ICD-10-CM | POA: Insufficient documentation

## 2020-06-25 NOTE — Telephone Encounter (Signed)
Pt called stating that she was returning your call. Pls call her again.  

## 2020-06-25 NOTE — Telephone Encounter (Signed)
The pt has been advised and will come in for labs at her convenience. She will begin B 12 and follow up with PCP.      She has a vitamin B12 deficiency. She needs to have additional laboratories including intrinsic factor antibody and antiparietal cell antibody drawn. She should be initiated on B12 supplementation at 2000 mcg daily. Her primary care provider can follow this up in 2 to 3 months. I also recommend, if insurance gives approval, for [place] to try and exclude atrophic metaplasia autoimmune gastritis that can lead to B12 deficiency and that an upper endoscopy be added on to the scheduled colonoscopy date in the hospital. Thanks. GM

## 2020-07-02 ENCOUNTER — Other Ambulatory Visit: Payer: Self-pay

## 2020-07-02 ENCOUNTER — Encounter (HOSPITAL_COMMUNITY): Payer: Self-pay | Admitting: Gastroenterology

## 2020-07-08 ENCOUNTER — Other Ambulatory Visit (HOSPITAL_COMMUNITY)
Admission: RE | Admit: 2020-07-08 | Discharge: 2020-07-08 | Disposition: A | Discharge status: Home / Self Care | Payer: Medicaid Other | Source: Ambulatory Visit | Attending: Gastroenterology | Admitting: Gastroenterology

## 2020-07-08 DIAGNOSIS — Z01812 Encounter for preprocedural laboratory examination: Secondary | ICD-10-CM | POA: Diagnosis present

## 2020-07-08 DIAGNOSIS — Z20822 Contact with and (suspected) exposure to covid-19: Secondary | ICD-10-CM | POA: Insufficient documentation

## 2020-07-08 LAB — SARS CORONAVIRUS 2 (TAT 6-24 HRS): SARS Coronavirus 2: NEGATIVE

## 2020-07-12 ENCOUNTER — Ambulatory Visit (HOSPITAL_COMMUNITY)
Admission: RE | Admit: 2020-07-12 | Discharge: 2020-07-12 | Disposition: A | Discharge status: Home / Self Care | Payer: Medicaid Other | Source: Ambulatory Visit | Attending: Gastroenterology | Admitting: Gastroenterology

## 2020-07-12 ENCOUNTER — Ambulatory Visit (HOSPITAL_COMMUNITY): Payer: Medicaid Other | Admitting: Anesthesiology

## 2020-07-12 ENCOUNTER — Encounter (HOSPITAL_COMMUNITY): Payer: Self-pay | Admitting: Gastroenterology

## 2020-07-12 ENCOUNTER — Encounter (HOSPITAL_COMMUNITY)
Admission: RE | Disposition: A | Discharge status: Home / Self Care | Payer: Self-pay | Source: Ambulatory Visit | Attending: Gastroenterology

## 2020-07-12 ENCOUNTER — Other Ambulatory Visit: Payer: Self-pay

## 2020-07-12 DIAGNOSIS — K641 Second degree hemorrhoids: Secondary | ICD-10-CM | POA: Diagnosis not present

## 2020-07-12 DIAGNOSIS — K2289 Other specified disease of esophagus: Secondary | ICD-10-CM | POA: Insufficient documentation

## 2020-07-12 DIAGNOSIS — K2980 Duodenitis without bleeding: Secondary | ICD-10-CM | POA: Diagnosis not present

## 2020-07-12 DIAGNOSIS — K2100 Gastro-esophageal reflux disease with esophagitis, without bleeding: Secondary | ICD-10-CM | POA: Diagnosis not present

## 2020-07-12 DIAGNOSIS — K5900 Constipation, unspecified: Secondary | ICD-10-CM | POA: Insufficient documentation

## 2020-07-12 DIAGNOSIS — R140 Abdominal distension (gaseous): Secondary | ICD-10-CM | POA: Diagnosis not present

## 2020-07-12 DIAGNOSIS — Z87898 Personal history of other specified conditions: Secondary | ICD-10-CM

## 2020-07-12 DIAGNOSIS — Z79899 Other long term (current) drug therapy: Secondary | ICD-10-CM | POA: Diagnosis not present

## 2020-07-12 DIAGNOSIS — Z09 Encounter for follow-up examination after completed treatment for conditions other than malignant neoplasm: Secondary | ICD-10-CM | POA: Insufficient documentation

## 2020-07-12 DIAGNOSIS — K644 Residual hemorrhoidal skin tags: Secondary | ICD-10-CM | POA: Diagnosis not present

## 2020-07-12 DIAGNOSIS — Z8719 Personal history of other diseases of the digestive system: Secondary | ICD-10-CM | POA: Diagnosis not present

## 2020-07-12 DIAGNOSIS — K3189 Other diseases of stomach and duodenum: Secondary | ICD-10-CM | POA: Diagnosis not present

## 2020-07-12 DIAGNOSIS — D649 Anemia, unspecified: Secondary | ICD-10-CM | POA: Diagnosis not present

## 2020-07-12 DIAGNOSIS — F17210 Nicotine dependence, cigarettes, uncomplicated: Secondary | ICD-10-CM | POA: Insufficient documentation

## 2020-07-12 DIAGNOSIS — K319 Disease of stomach and duodenum, unspecified: Secondary | ICD-10-CM | POA: Diagnosis not present

## 2020-07-12 DIAGNOSIS — K5792 Diverticulitis of intestine, part unspecified, without perforation or abscess without bleeding: Secondary | ICD-10-CM | POA: Diagnosis present

## 2020-07-12 DIAGNOSIS — K5732 Diverticulitis of large intestine without perforation or abscess without bleeding: Secondary | ICD-10-CM | POA: Diagnosis not present

## 2020-07-12 DIAGNOSIS — Z886 Allergy status to analgesic agent status: Secondary | ICD-10-CM | POA: Diagnosis not present

## 2020-07-12 DIAGNOSIS — R1013 Epigastric pain: Secondary | ICD-10-CM | POA: Diagnosis present

## 2020-07-12 HISTORY — PX: BIOPSY: SHX5522

## 2020-07-12 HISTORY — PX: ESOPHAGOGASTRODUODENOSCOPY (EGD) WITH PROPOFOL: SHX5813

## 2020-07-12 HISTORY — DX: Sleep apnea, unspecified: G47.30

## 2020-07-12 HISTORY — PX: COLONOSCOPY WITH PROPOFOL: SHX5780

## 2020-07-12 SURGERY — COLONOSCOPY WITH PROPOFOL
Anesthesia: Monitor Anesthesia Care

## 2020-07-12 MED ORDER — PROPOFOL 500 MG/50ML IV EMUL
INTRAVENOUS | Status: AC
  Filled 2020-07-12: qty 50

## 2020-07-12 MED ORDER — ACETAMINOPHEN 500 MG PO TABS
1000.0000 mg | ORAL_TABLET | Freq: Once | ORAL | Status: AC
  Administered 2020-07-12: 1000 mg via ORAL

## 2020-07-12 MED ORDER — LIDOCAINE 2% (20 MG/ML) 5 ML SYRINGE
INTRAMUSCULAR | Status: DC | PRN
  Administered 2020-07-12: 100 mg via INTRAVENOUS

## 2020-07-12 MED ORDER — GLYCOPYRROLATE 0.2 MG/ML IJ SOLN
INTRAMUSCULAR | Status: DC | PRN
  Administered 2020-07-12: .1 mg via INTRAVENOUS

## 2020-07-12 MED ORDER — SODIUM CHLORIDE 0.9 % IV SOLN: INTRAVENOUS | Status: DC

## 2020-07-12 MED ORDER — PROPOFOL 500 MG/50ML IV EMUL
INTRAVENOUS | Status: DC | PRN
  Administered 2020-07-12: 300 ug/kg/min via INTRAVENOUS

## 2020-07-12 MED ORDER — LACTATED RINGERS IV SOLN
INTRAVENOUS | Status: AC | PRN
  Administered 2020-07-12: 1000 mL via INTRAVENOUS

## 2020-07-12 MED ORDER — ACETAMINOPHEN 500 MG PO TABS
ORAL_TABLET | ORAL | Status: AC
  Filled 2020-07-12: qty 2

## 2020-07-12 MED ORDER — LACTATED RINGERS IV SOLN: INTRAVENOUS | Status: DC | PRN

## 2020-07-12 MED ORDER — OMEPRAZOLE 40 MG PO CPDR: 40.0000 mg | DELAYED_RELEASE_CAPSULE | Freq: Two times a day (BID) | ORAL | 6 refills | Status: AC

## 2020-07-12 SURGICAL SUPPLY — 24 items

## 2020-07-12 NOTE — Transfer of Care (Signed)
Immediate Anesthesia Transfer of Care Note  Patient: Phelicia Dantes Hovater  Procedure(s) Performed: COLONOSCOPY WITH PROPOFOL (N/A ) ESOPHAGOGASTRODUODENOSCOPY (EGD) WITH PROPOFOL (N/A ) BIOPSY  Patient Location: Endoscopy Unit  Anesthesia Type:MAC  Level of Consciousness: awake, alert , oriented and patient cooperative  Airway & Oxygen Therapy: Patient Spontanous Breathing and Patient connected to face mask  Post-op Assessment: Report given to RN and Post -op Vital signs reviewed and stable  Post vital signs: Reviewed and stable  Last Vitals:  Vitals Value Taken Time  BP    Temp    Pulse    Resp 15 07/12/20 1133  SpO2    Vitals shown include unvalidated device data.  Last Pain:  Vitals:   07/12/20 0830  TempSrc: Oral  PainSc: 0-No pain         Complications: No complications documented.

## 2020-07-12 NOTE — Op Note (Signed)
[facility] Patient Name: Beverly Wong Procedure Date: 07/12/2020 MRN: [id number] Attending MD: Justice Britain , MD Date of Birth: 12-06-1980 CSN: [id number] Age: 40 Admit Type: Outpatient Procedure:                Colonoscopy Indications:              Abnormal CT of the GI tract, Diverticulitis,                            Follow-up of diverticulitis complicated by                            abscess/fistula Providers:                Justice Britain, MD, Jeanella Cara, RN,                            Cletis Athens, Technician Referring MD:             Tarry Kos. Avbuere MD, Sharon Mt. White MD, MD Medicines:                Monitored Anesthesia Care Complications:            No immediate complications. Estimated Blood Loss:     Estimated blood loss: none. Procedure:                Pre-Anesthesia Assessment:                           - Prior to the procedure, a History and Physical                            was performed, and patient medications and                            allergies were reviewed. The patient's tolerance of                            previous anesthesia was also reviewed. The risks                            and benefits of the procedure and the sedation                            options and risks were discussed with the patient.                            All questions were answered, and informed consent                            was obtained. Prior Anticoagulants: The patient has                            taken no previous anticoagulant or antiplatelet  agents. ASA Grade Assessment: II - A patient with                            mild systemic disease. After reviewing the risks                            and benefits, the patient was deemed in                            satisfactory condition to undergo the procedure.                           After obtaining informed consent, the colonoscope                             was passed under direct vision. Throughout the                            procedure, the patient's blood pressure, pulse, and                            oxygen saturations were monitored continuously. The                            [id number]-[id number] ([id number]) Olympus pediatric colonscope                            was introduced through the anus and advanced to the                            5 cm into the ileum. The colonoscopy was performed                            without difficulty. The patient tolerated the                            procedure. The quality of the bowel preparation was                            good. The terminal ileum, ileocecal valve,                            appendiceal orifice, and rectum were photographed. Scope In: 11:07:40 AM Scope Out: 11:25:15 AM Scope Withdrawal Time: 0 hours 13 minutes 10 seconds  Total Procedure Duration: 0 hours 17 minutes 35 seconds  Findings:      The digital rectal exam findings include hemorrhoids. Pertinent       negatives include no palpable rectal lesions.      The terminal ileum and ileocecal valve appeared normal.      Many small and large-mouthed diverticula were found in the entire colon.      Normal mucosa was found in the entire colon otherwise.      Non-bleeding non-thrombosed external and internal hemorrhoids were found       during retroflexion,  during perianal exam and during digital exam. The       hemorrhoids were Grade II (internal hemorrhoids that prolapse but reduce       spontaneously). Impression:               - Hemorrhoids found on digital rectal exam.                           - The examined portion of the ileum was normal.                           - Diverticulosis in the entire examined colon.                           - Normal mucosa in the entire examined colon                            otherwise.                           - Non-bleeding non-thrombosed external and internal                             hemorrhoids. Moderate Sedation:      Not Applicable - Patient had care per Anesthesia. Recommendation:           - The patient will be observed post-procedure,                            until all discharge criteria are met.                           - Discharge patient to home.                           - Patient has a contact number available for                            emergencies. The signs and symptoms of potential                            delayed complications were discussed with the                            patient. Return to normal activities tomorrow.                            Written discharge instructions were provided to the                            patient.                           - High fiber diet.                           - Continue present medications.                           -  Complete any further antibiotics if available                            otherwise no further antibiotics are necessary at                            this time. Hopefully, with the patient having done                            so well, we will not have stirred up or initiated                            another bout of diverticulitis or had complications                            from prior abscess cavity.                           - Follow up with Colorectal surgery to discuss                            potential options in regards to therapeutic                            intention in the future. Difficult situation could                            be had since she has evidence of diverticulosis                            throughout, but the left colon is the most likely                            source of her previous diverticulitis based on                            imaging over the last few months.                           - Repeat colonoscopy in 10 years for screening                            purposes.                           - The findings and recommendations were  discussed                            with the patient.                           - The findings and recommendations were discussed  with the designated responsible adult. Procedure Code(s):        --- Professional ---                           [phone], Colonoscopy, flexible; diagnostic, including                            collection of specimen(s) by brushing or washing,                            when performed (separate procedure) Diagnosis Code(s):        --- Professional ---                           K64.1, Second degree hemorrhoids                           K57.32, Diverticulitis of large intestine without                            perforation or abscess without bleeding                           K57.30, Diverticulosis of large intestine without                            perforation or abscess without bleeding                           R93.3, Abnormal findings on diagnostic imaging of                            other parts of digestive tract CPT copyright 2019 American Medical Association. All rights reserved. The codes documented in this report are preliminary and upon coder review may  be revised to meet current compliance requirements. Justice Britain, MD 07/12/2020 11:45:45 AM Number of Addenda: 0

## 2020-07-12 NOTE — Anesthesia Preprocedure Evaluation (Addendum)
Anesthesia Evaluation  Patient identified by MRN, date of birth, ID band Patient awake    Reviewed: Allergy & Precautions, H&P , NPO status , Patient's Chart, lab work & pertinent test results  Airway Mallampati: II  TM Distance: >3 FB Neck ROM: Full   Comment: Good neck range of motion despite h/o anterior cervical fusion Dental no notable dental hx.    Pulmonary sleep apnea , COPD, Current Smoker and Patient abstained from smoking.,    Pulmonary exam normal breath sounds clear to auscultation       Cardiovascular negative cardio ROS Normal cardiovascular exam Rhythm:Regular Rate:Normal     Neuro/Psych  Headaches, PSYCHIATRIC DISORDERS Bipolar Disorder Schizophrenia  Neuromuscular disease (cervical radiculopathy s/p anterior fusion)    GI/Hepatic negative GI ROS, (+)     substance abuse  marijuana use,   Endo/Other  obesity  Renal/GU negative Renal ROS  negative genitourinary   Musculoskeletal negative musculoskeletal ROS (+)   Abdominal   Peds negative pediatric ROS (+)  Hematology negative hematology ROS (+) anemia ,   Anesthesia Other Findings   Reproductive/Obstetrics negative OB ROS                            Anesthesia Physical Anesthesia Plan  ASA: III  Anesthesia Plan: MAC   Post-op Pain Management:    Induction: Intravenous  PONV Risk Score and Plan: 1 and Propofol infusion, TIVA and Treatment may vary due to age or medical condition  Airway Management Planned:   Additional Equipment: None  Intra-op Plan:   Post-operative Plan:   Informed Consent: I have reviewed the patients History and Physical, chart, labs and discussed the procedure including the risks, benefits and alternatives for the proposed anesthesia with the patient or authorized representative who has indicated his/her understanding and acceptance.     Dental advisory given  Plan Discussed with:  CRNA, Anesthesiologist and Surgeon  Anesthesia Plan Comments:        Anesthesia Quick Evaluation

## 2020-07-12 NOTE — Anesthesia Procedure Notes (Signed)
Procedure Name: MAC Date/Time: 07/12/2020 10:43 AM Performed by: Claudia Desanctis, CRNA Pre-anesthesia Checklist: Patient identified, Emergency Drugs available, Suction available, Patient being monitored and Timeout performed Patient Re-evaluated:Patient Re-evaluated prior to induction Oxygen Delivery Method: Simple face mask Preoxygenation: Pre-oxygenation with 100% oxygen (POM mask) Placement Confirmation: positive ETCO2

## 2020-07-12 NOTE — Discharge Instructions (Signed)

## 2020-07-12 NOTE — Progress Notes (Signed)
Call placed to Dr. Donavan Foil MDA. Reported patient complains of headacche 6/10. Requests tylenol. Verbal order for tylenol 1000mg  PO x1 dose.

## 2020-07-12 NOTE — Op Note (Signed)
[facility] Patient Name: Beverly Wong Procedure Date: 07/12/2020 MRN: [id number] Attending MD: Corliss ParishGabriel Mansouraty , MD Date of Birth: 02/06/1981 CSN: [id number] Age: [age] Admit Type: Outpatient Procedure:                Upper GI endoscopy Indications:              Epigastric abdominal pain, Abdominal bloating Providers:                Corliss ParishGabriel Mansouraty, MD, Margaree MackintoshHayleigh Westmoreland, RN,                            Harrington ChallengerHope Parker, Technician Referring MD:             Verlon SettingEdwin A. Avbuere MD, Stephanie Couphristopher M. White MD, MD Medicines:                Monitored Anesthesia Care Complications:            No immediate complications. Estimated Blood Loss:     Estimated blood loss was minimal. Procedure:                Pre-Anesthesia Assessment:                           - Prior to the procedure, a History and Physical                            was performed, and patient medications and                            allergies were reviewed. The patient's tolerance of                            previous anesthesia was also reviewed. The risks                            and benefits of the procedure and the sedation                            options and risks were discussed with the patient.                            All questions were answered, and informed consent                            was obtained. Prior Anticoagulants: The patient has                            taken no previous anticoagulant or antiplatelet                            agents. ASA Grade Assessment: II - A patient with                            mild systemic disease. After reviewing the risks  and benefits, the patient was deemed in                            satisfactory condition to undergo the procedure.                           After obtaining informed consent, the endoscope was                            passed under direct vision. Throughout the                            procedure, the  patient's blood pressure, pulse, and                            oxygen saturations were monitored continuously. The                            GIF-H190 ([id number] Olympus gastroscope was                            introduced through the mouth, and advanced to the                            second part of duodenum. The upper GI endoscopy was                            accomplished without difficulty. The patient                            tolerated the procedure. Scope In: Scope Out: Findings:      No gross lesions were noted in the proximal esophagus and in the mid       esophagus.      Scattered islands of salmon-colored mucosa were present from 38 to 39       cm. No other visible abnormalities were present. Biopsies were taken       with a cold forceps for histology.      LA Grade B (one or more mucosal breaks greater than 5 mm, not extending       between the tops of two mucosal folds) esophagitis with no bleeding was       found in the distal esophagus.      The Z-line was regular and was found 40 cm from the incisors.      Patchy mildly erythematous mucosa without bleeding was found in the       gastric body.      No gross lesions were noted in the entire examined stomach. Biopsies       were taken with a cold forceps for histology and Helicobacter pylori       testing.      No gross lesions were noted in the duodenal bulb, in the first portion       of the duodenum and in the second portion of the duodenum. Biopsies were       taken with a cold forceps for histology. Impression:               -  No gross lesions in esophagus.                           - Salmon-colored mucosa suspicious for Barrett's                            esophagus. Biopsied.                           - LA Grade B esophagitis with no bleeding.                           - Z-line regular, 40 cm from the incisors.                           - Erythematous mucosa in the gastric body.                           - No  gross lesions in the stomach. Biopsied.                           - No gross lesions in the duodenal bulb, in the                            first portion of the duodenum and in the second                            portion of the duodenum. Biopsied. Moderate Sedation:      Not Applicable - Patient had care per Anesthesia. Recommendation:           - Proceed to scheduled colonoscopy.                           - Continue present medications.                           - Await pathology results.                           - Initiate Omeprazole 40 mg twice daily.                           - Repeat upper endoscopy for surveillance based on                            pathology results but likely 3-4 months to check on                            healing of esophagitis.                           - The findings and recommendations were discussed                            with the patient.                           -  The findings and recommendations were discussed                            with the designated responsible adult. Procedure Code(s):        --- Professional ---                           [phone], Esophagogastroduodenoscopy, flexible,                            transoral; with biopsy, single or multiple Diagnosis Code(s):        --- Professional ---                           K22.8, Other specified diseases of esophagus                           K31.89, Other diseases of stomach and duodenum                           R10.13, Epigastric pain                           R14.0, Abdominal distension (gaseous) CPT copyright 2019 American Medical Association. All rights reserved. The codes documented in this report are preliminary and upon coder review may  be revised to meet current compliance requirements. Corliss Parish, MD 07/12/2020 11:35:40 AM Number of Addenda: 0

## 2020-07-12 NOTE — Anesthesia Postprocedure Evaluation (Signed)
Anesthesia Post Note  Patient: Beverly Wong  Procedure(s) Performed: COLONOSCOPY WITH PROPOFOL (N/A ) ESOPHAGOGASTRODUODENOSCOPY (EGD) WITH PROPOFOL (N/A ) BIOPSY     Patient location during evaluation: PACU Anesthesia Type: MAC Level of consciousness: awake and alert Pain management: pain level controlled Vital Signs Assessment: post-procedure vital signs reviewed and stable Respiratory status: spontaneous breathing and respiratory function stable Cardiovascular status: stable Postop Assessment: no apparent nausea or vomiting Anesthetic complications: no   No complications documented.  Last Vitals:  Vitals:   07/12/20 1135 07/12/20 1145  BP: 130/79 (!) 114/37  Pulse: 76 (!) 54  Resp: 18 19  Temp: 36.9 C   SpO2: 99% 99%    Last Pain:  Vitals:   07/12/20 1145  TempSrc:   PainSc: 6                  Candra R Keddrick Wyne

## 2020-07-12 NOTE — Interval H&P Note (Signed)
History and Physical Interval Note:  07/12/2020 10:30 AM  Beverly Wong  has presented today for surgery, with the diagnosis of Diverticulitis, Hx of Anemia, Bloating, Abd pain.  The various methods of treatment have been discussed with the patient and family. After consideration of risks, benefits and other options for treatment, the patient has consented to  Procedure(s): COLONOSCOPY WITH PROPOFOL (N/A) ESOPHAGOGASTRODUODENOSCOPY (EGD) WITH PROPOFOL (N/A) as a surgical intervention.  The patient's history has been reviewed, patient examined, no change in status, stable for surgery.  I have reviewed the patient's chart and labs.  Questions were answered to the patient's satisfaction.     [employer]

## 2020-07-13 LAB — SURGICAL PATHOLOGY

## 2020-07-16 ENCOUNTER — Encounter: Payer: Self-pay | Admitting: Gastroenterology

## 2020-07-16 ENCOUNTER — Telehealth: Payer: Self-pay | Admitting: Gastroenterology

## 2020-07-16 NOTE — Telephone Encounter (Signed)
Called patient to schedule OV left voicemail. ?

## 2020-07-16 NOTE — Progress Notes (Unsigned)
Review of [facility] surgery consultation by Dr. Cliffton Asters  Patient evaluated on July 14, 2020  Patient was offered consideration of colectomy as a result of her recurrent diverticulitis attacks as well as previous pericolonic abscesses and multiple attacks.  After discussion with Dr. Cliffton Asters, she was not interested in pursuing surgery but was welcome to return for follow-up.  At this point in time, we will continue to monitor the patient with hope that she continues to have clinical improvement overall.  No further antibiotics will be administered at this time.  I recommend a follow-up in clinic with PA Esterwood or myself in 6 to 10 weeks.  We can consider at that point repeat imaging to document hopeful completion of abscess treatment.  We will set up the follow-up in clinic.  Corliss Parish, MD [facility] # [phone]

## 2020-07-16 NOTE — Progress Notes (Unsigned)
Please see note below and schedule pt for OV.

## 2020-08-24 ENCOUNTER — Telehealth: Payer: Self-pay | Admitting: Gastroenterology

## 2020-08-24 DIAGNOSIS — R109 Unspecified abdominal pain: Secondary | ICD-10-CM

## 2020-08-24 DIAGNOSIS — K5732 Diverticulitis of large intestine without perforation or abscess without bleeding: Secondary | ICD-10-CM

## 2020-08-24 NOTE — Telephone Encounter (Signed)
Placed call to the pt and no answer message left to return call

## 2020-08-24 NOTE — Telephone Encounter (Signed)
This needs to be triage by the nurse. Please forward to Nurse.

## 2020-08-24 NOTE — Telephone Encounter (Signed)
Inbound call from patient. Request a call back to discuss stomach pain, hard time to go to the bathroom, believes have an infection wants antibiotic amoxcillin. Best contact # [phone]

## 2020-08-25 MED ORDER — CIPROFLOXACIN HCL 500 MG PO TABS: 500.0000 mg | ORAL_TABLET | Freq: Two times a day (BID) | ORAL | 0 refills | Status: AC

## 2020-08-25 MED ORDER — METRONIDAZOLE 500 MG PO TABS: 500.0000 mg | ORAL_TABLET | Freq: Two times a day (BID) | ORAL | 0 refills | Status: AC

## 2020-08-25 NOTE — Telephone Encounter (Signed)
The pt has been advised  Prescription sent  CT ordered and sent to the schedulers to call pt with appt.   She will call with any further concerns- we will call her with results.

## 2020-08-25 NOTE — Telephone Encounter (Signed)
Okay for antibiotics.  Cipro 500 twice daily x10 days and Flagyl 500 twice daily x10 days.  Patient needs CT scan abdomen/pelvis with IV and oral contrast.  She can initiate antibiotics first.  Concern for possible recurrent diverticulitis. She had deferred on partial colectomy by Dr. Cliffton Asters in the past but if she has recurrent evidence of diverticulitis then I am not sure we will be able to just continue throwing antibiotics on her especially when she has had complicated diverticulitis in the past 2. Thanks. GM  FYI CW about our mutual patient

## 2020-08-25 NOTE — Telephone Encounter (Signed)
The pt calls with complaints of Constipation, Left side abd pain, Rectal pain, No fever, Mucous in stool but no blood she has a history of diverticulitis.  An appt has been made for 5/4 as follow up.  She would like antibiotics called in please advise

## 2020-09-15 ENCOUNTER — Ambulatory Visit: Payer: Medicaid Other | Admitting: Gastroenterology

## 2020-09-15 ENCOUNTER — Telehealth: Payer: Self-pay

## 2020-09-15 DIAGNOSIS — R109 Unspecified abdominal pain: Secondary | ICD-10-CM

## 2020-09-15 DIAGNOSIS — K5732 Diverticulitis of large intestine without perforation or abscess without bleeding: Secondary | ICD-10-CM

## 2020-09-15 NOTE — Telephone Encounter (Signed)
Called and spoke with pt. She would like to have CT scan done before having follow up with Dr. [name]. CT scan has been scheduled for 09/27/20 @ [facility] 8:30am. TOA 8:15am. Office follow-up appointment with Dr.Mansouraty on 10/22/20 @ 9:30am. Pt will come by office and pick-up contrast. Pt will also need to have labs drawn prior to having CT. Order for BMET has been placed in epic.Pt voiced understanding.                                                                                                                                    You have been scheduled for a CT scan of the abdomen and pelvis at [facility], 1st floor Radiology. You are scheduled on 09/27/20 at 8:30am. You should arrive 15 minutes prior to your appointment time for registration.  Please pick up 2 bottles of contrast from [facility] at least 3 days prior to your scan. The solution may taste better if refrigerated, but do NOT add ice or any other liquid to this solution. Shake well before drinking.   Please follow the written instructions below on the day of your exam:   1) Do not eat anything after 4:30am (4 hours prior to your test)   2) Drink 1 bottle of contrast @ 6:30am (2 hours prior to your exam)  Remember to shake well before drinking and do NOT pour over ice.     Drink 1 bottle of contrast @ 7:30am (1 hour prior to your exam)   You may take any medications as prescribed with a small amount of water, if necessary. If you take any of the following medications: METFORMIN, GLUCOPHAGE, GLUCOVANCE, AVANDAMET, RIOMET, FORTAMET, [place] MET, JANUMET, GLUMETZA or METAGLIP, you MAY be asked to HOLD this medication 48 hours AFTER the exam.   The purpose of you drinking the oral contrast is to aid in the visualization of your intestinal tract. The contrast solution may cause some diarrhea. Depending on your individual set of symptoms, you may also receive an intravenous injection of x-ray contrast/dye. Plan on  being at [facility] for 45 minutes or longer, depending on the type of exam you are having performed.   If you have any questions regarding your exam or if you need to reschedule, you may call [facility] at [phone] between the hours of 8:00 am and 5:00 pm, Monday-Friday.

## 2020-09-27 ENCOUNTER — Ambulatory Visit (HOSPITAL_COMMUNITY): Admission: RE | Admit: 2020-09-27 | Payer: Medicaid Other | Source: Ambulatory Visit

## 2020-10-01 ENCOUNTER — Ambulatory Visit (HOSPITAL_COMMUNITY): Payer: Medicaid Other

## 2020-10-08 ENCOUNTER — Ambulatory Visit (HOSPITAL_COMMUNITY): Payer: Medicaid Other

## 2020-10-22 ENCOUNTER — Ambulatory Visit: Payer: Medicaid Other | Admitting: Gastroenterology

## 2020-11-24 ENCOUNTER — Encounter: Payer: Self-pay | Admitting: Gastroenterology

## 2021-07-25 ENCOUNTER — Encounter (HOSPITAL_COMMUNITY): Payer: Self-pay | Admitting: Physician Assistant

## 2021-07-25 ENCOUNTER — Emergency Department
Admission: EM | Admit: 2021-07-25 | Discharge: 2021-07-26 | Disposition: A | Discharge status: Other Acute Inpatient Hospital | Payer: Medicaid Other | Attending: Physician Assistant | Admitting: Physician Assistant

## 2021-07-25 ENCOUNTER — Other Ambulatory Visit: Payer: Self-pay

## 2021-07-25 ENCOUNTER — Emergency Department (EMERGENCY_DEPARTMENT_HOSPITAL): Payer: Medicaid Other

## 2021-07-25 DIAGNOSIS — K5732 Diverticulitis of large intestine without perforation or abscess without bleeding: Secondary | ICD-10-CM

## 2021-07-25 DIAGNOSIS — Z6832 Body mass index (BMI) 32.0-32.9, adult: Secondary | ICD-10-CM

## 2021-07-25 DIAGNOSIS — K760 Fatty (change of) liver, not elsewhere classified: Secondary | ICD-10-CM | POA: Insufficient documentation

## 2021-07-25 DIAGNOSIS — N83201 Unspecified ovarian cyst, right side: Secondary | ICD-10-CM

## 2021-07-25 DIAGNOSIS — R1032 Left lower quadrant pain: Secondary | ICD-10-CM

## 2021-07-25 DIAGNOSIS — N83202 Unspecified ovarian cyst, left side: Secondary | ICD-10-CM

## 2021-07-25 DIAGNOSIS — R9389 Abnormal findings on diagnostic imaging of other specified body structures: Secondary | ICD-10-CM

## 2021-07-25 DIAGNOSIS — K5730 Diverticulosis of large intestine without perforation or abscess without bleeding: Secondary | ICD-10-CM

## 2021-07-25 DIAGNOSIS — K5792 Diverticulitis of intestine, part unspecified, without perforation or abscess without bleeding: Secondary | ICD-10-CM

## 2021-07-25 HISTORY — DX: Schizophrenia, unspecified (CMS HCC): F20.9

## 2021-07-25 LAB — COMPREHENSIVE METABOLIC PANEL, NON-FASTING
ALBUMIN/GLOBULIN RATIO: 1.6 — ABNORMAL HIGH (ref 0.8–1.4)
ALBUMIN: 4.4 g/dL (ref 3.5–5.7)
ALKALINE PHOSPHATASE: 51 U/L (ref 34–104)
ALT (SGPT): 8 U/L (ref 7–52)
ANION GAP: 7 mmol/L — ABNORMAL LOW (ref 10–20)
AST (SGOT): 10 U/L — ABNORMAL LOW (ref 13–39)
BILIRUBIN TOTAL: 0.4 mg/dL (ref 0.3–1.2)
BUN/CREA RATIO: 7 (ref 6–22)
BUN: 5 mg/dL — ABNORMAL LOW (ref 7–25)
CALCIUM, CORRECTED: 9 mg/dL (ref 8.9–10.8)
CALCIUM: 9.4 mg/dL (ref 8.6–10.3)
CHLORIDE: 107 mmol/L (ref 98–107)
CO2 TOTAL: 26 mmol/L (ref 21–31)
CREATININE: 0.74 mg/dL (ref 0.60–1.30)
ESTIMATED GFR: 104 mL/min/{1.73_m2} (ref >59)
GLOBULIN: 2.8 — ABNORMAL LOW (ref 2.9–5.4)
GLUCOSE: 96 mg/dL (ref 74–109)
OSMOLALITY, CALCULATED: 277 mOsm/kg (ref 270–290)
POTASSIUM: 3.7 mmol/L (ref 3.5–5.1)
PROTEIN TOTAL: 7.2 g/dL (ref 6.4–8.9)
SODIUM: 140 mmol/L (ref 136–145)

## 2021-07-25 LAB — CBC WITH DIFF
BASOPHIL #: 0.1 10*3/uL (ref 0.00–2.50)
BASOPHIL %: 1 % (ref 0–3)
EOSINOPHIL #: 0 10*3/uL (ref 0.00–2.40)
EOSINOPHIL %: 0 % (ref 0–7)
HCT: 41.6 % (ref 37.0–47.0)
HGB: 14.2 g/dL (ref 12.5–16.0)
LYMPHOCYTE #: 2.2 10*3/uL (ref 2.10–11.00)
LYMPHOCYTE %: 26 % (ref 25–45)
MCH: 30.7 pg (ref 27.0–32.0)
MCHC: 34.1 g/dL (ref 32.0–36.0)
MCV: 90 fL (ref 78.0–99.0)
MONOCYTE #: 0.5 10*3/uL (ref 0.00–4.10)
MONOCYTE %: 6 % (ref 0–12)
MPV: 8.4 fL (ref 7.4–10.4)
NEUTROPHIL #: 5.7 10*3/uL (ref 4.10–29.00)
NEUTROPHIL %: 68 % (ref 40–76)
PLATELETS: 289 10*3/uL (ref 140–440)
RBC: 4.63 10*6/uL (ref 4.20–5.40)
RDW: 14.4 % (ref 11.6–14.8)
WBC: 8.5 10*3/uL (ref 4.0–10.5)
WBCS UNCORRECTED: 8.5 10*3/uL

## 2021-07-25 LAB — C-REACTIVE PROTEIN(CRP),INFLAMMATION: C-REACTIVE PROTEIN (CRP): 1.9 mg/dL — ABNORMAL HIGH (ref 0.1–0.5)

## 2021-07-25 LAB — LACTIC ACID LEVEL W/ REFLEX FOR LEVEL >2.0: LACTIC ACID: 1 mmol/L (ref 0.5–2.2)

## 2021-07-25 MED ORDER — LEVOFLOXACIN 500 MG/100 ML IN 5 % DEXTROSE INTRAVENOUS PIGGYBACK
500.0000 mg | INJECTION | INTRAVENOUS | Status: AC
Start: 2021-07-26 — End: 2021-07-26
  Administered 2021-07-25: 500 mg via INTRAVENOUS
  Administered 2021-07-26: 0 mg via INTRAVENOUS

## 2021-07-25 MED ORDER — ONDANSETRON HCL (PF) 4 MG/2 ML INJECTION SOLUTION
4.0000 mg | INTRAMUSCULAR | Status: AC
Start: 2021-07-25 — End: 2021-07-25
  Administered 2021-07-25: 4 mg via INTRAVENOUS

## 2021-07-25 MED ORDER — ONDANSETRON HCL (PF) 4 MG/2 ML INJECTION SOLUTION
INTRAMUSCULAR | Status: AC
Start: 2021-07-25 — End: 2021-07-25
  Filled 2021-07-25: qty 2

## 2021-07-25 MED ORDER — MORPHINE 4 MG/ML INJECTION WRAPPER
INJECTION | INTRAMUSCULAR | Status: AC
Start: 2021-07-25 — End: 2021-07-25
  Filled 2021-07-25: qty 1

## 2021-07-25 MED ORDER — METRONIDAZOLE 500 MG/100 ML IN SODIUM CHLOR(ISO) INTRAVENOUS PIGGYBACK
INJECTION | INTRAVENOUS | Status: AC
Start: 2021-07-25 — End: 2021-07-25
  Filled 2021-07-25: qty 100

## 2021-07-25 MED ORDER — IOHEXOL 350 MG IODINE/ML INTRAVENOUS SOLUTION
100.0000 mL | INTRAVENOUS | Status: AC
Start: 2021-07-25 — End: 2021-07-25
  Administered 2021-07-25: 100 mL via INTRAVENOUS

## 2021-07-25 MED ORDER — MORPHINE 4 MG/ML INJECTION WRAPPER
4.0000 mg | INJECTION | INTRAMUSCULAR | Status: AC
Start: 2021-07-26 — End: 2021-07-26
  Administered 2021-07-26: 4 mg via INTRAVENOUS

## 2021-07-25 MED ORDER — METRONIDAZOLE 500 MG/100 ML IN SODIUM CHLOR(ISO) INTRAVENOUS PIGGYBACK
500.0000 mg | INJECTION | INTRAVENOUS | Status: AC
Start: 2021-07-26 — End: 2021-07-26
  Administered 2021-07-25: 500 mg via INTRAVENOUS
  Administered 2021-07-26: 0 mg via INTRAVENOUS

## 2021-07-25 MED ORDER — LEVOFLOXACIN 500 MG/100 ML IN 5 % DEXTROSE INTRAVENOUS PIGGYBACK
INJECTION | INTRAVENOUS | Status: AC
Start: 2021-07-25 — End: 2021-07-25
  Filled 2021-07-25: qty 100

## 2021-07-25 MED ORDER — MORPHINE 4 MG/ML INJECTION WRAPPER
4.0000 mg | INJECTION | INTRAMUSCULAR | Status: AC
Start: 2021-07-25 — End: 2021-07-25
  Administered 2021-07-25: 4 mg via INTRAVENOUS

## 2021-07-25 NOTE — ED Triage Notes (Shared)
[facility]  Emergency Department  Provider in Triage Note    Name: Rebecca Sparks  Age: 41 y.o.  Gender: female     Subjective:   Rebecca Sparks is a 41 y.o. female who presents with complaint of Abdominal Pain (Lower abdominal pain, nausea for about a week.  Hx of diverticulitis.  Scheduled for surgery on march 17th.)      Objective:   There were no vitals filed for this visit.   Focused Physical Exam shows patient sitting in chair, nauseated. Lower abd pain tenderness    Assessment:  A medical screening exam was completed.  This patient is a 41 y.o. female with initial findings showing abd pain and nausea    Plan:  Please see initial orders and work-up below.  This is to be continued with full evaluation in the main Emergency Department.     No current facility-administered medications for this encounter.     No results found for this or any previous visit (from the past 24 hour(s)).     Sherlie Ban, FNP-BC  07/25/2021, 19:13

## 2021-07-25 NOTE — ED Triage Notes (Signed)
Lower abdominal pain, nausea, scheduled for surgery for diverticulits.

## 2021-07-25 NOTE — ED Provider Notes (Signed)
Emergency Medicine      Name: Rebecca Sparks  Age and Gender: 41 y.o. female  Date of Birth: [birth year]  MRN: [id number]  PCP: Yancey Flemings, PA    CC:  Chief Complaint   Patient presents with    Abdominal Pain     Lower abdominal pain, nausea for about a week.  Hx of diverticulitis.  Scheduled for surgery on march 17th.       HPI:  Rebecca Sparks is a 41 y.o. Black/African American female who presents to the ER with lower abdominal pain for the past week.  Patient states she has a history of diverticulitis and has been having increased pain in her left lower quadrant for the past week.  She says today the pain has been unbearable.  Patient follows with Dr. Wyline Beady who plans to perform a partial bowel resection on Friday due to the patient's recurrent episodes of diverticulitis.  Patient reports associated nausea and vomiting, chills and sweats.  Her last bowel movement was today and was formed.    Below pertinent information reviewed with patient:  Past Medical History:   Diagnosis Date    Schizophrenia (CMS HCC)            No Known Allergies    Past Surgical History:   Procedure Laterality Date    ENDOMETRIAL ABLATION      HX TUBAL LIGATION              Social History     Socioeconomic History    Marital status: Single       ROS:  No other overt positive review of systems are noted other than stated in the HPI.      Objective:    ED Triage Vitals [07/25/21 1914]   BP (Non-Invasive) (!) 124/93   Heart Rate 91   Respiratory Rate 18   Temperature 36.7 C (98 F)   SpO2 99 %   Weight 97.5 kg (215 lb)   Height 1.727 m (5\' 8" )     Filed Vitals:    07/25/21 1914   BP: (!) 124/93   Pulse: 91   Resp: 18   Temp: 36.7 C (98 F)   SpO2: 99%       Nursing notes and vital signs reviewed.    Constitutional - No acute distress.  Alert and Active.  HEENT - Normocephalic. Atraumatic. PERRL. EOMI. Conjunctiva clear. TM's pearly grey, translucent, without bulging or retraction. Oropharynx with no erythema, lesions, or  exudates. Moist mucous membranes.   Neck - Trachea midline. No stridor. No hoarseness.  Cardiac - Regular rate and rhythm. No murmurs, rubs, or gallops. Intact distal pulses.  Respiratory/Chest - Normal respiratory effort. Clear to auscultation bilaterally. No rales, wheezes or rhonchi. No chest tenderness.  Abdomen - Normal bowel sounds.  Tenderness in the left lower quadrant,, soft, non-distended. No rebound or guarding.   Musculoskeletal - Good AROM.  Tenderness across the low back.  No clubbing, cyanosis or edema.  Skin - Warm and dry, without any rashes or other lesions.  Neuro - Alert and oriented x 3. Cranial nerves II-XII are grossly intact.  Moving all extremities symmetrically. Normal gait.  Psych - Normal mood and affect. Behavior is normal       Any pertinent labs and imaging obtained during this encounter reviewed below in MDM.    MDM/ED Course:      Medical Decision Making  Patient with a past medical history of recurrent diverticulitis presented  to the ER with worsening left lower quadrant pain over the past week.  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  Changes of bilateral ovaries.  Patient was made aware of diagnostic findings.  She was given morphine, Zofran, Levaquin, and Flagyl while in the ER.  She did ask that we consult with her surgeon for transfer as she is currently scheduled for an outpatient partial colectomy on Friday.  I was able to discuss the case with the patient's surgeon, Dr. Wyline BeadyAmanda Reece, who agreed to see the patient upon transfer.  ER physician [facility]rinka [facility]Smith at [facility] the patient for ER to ER transfer    Amount and/or Complexity of Data Reviewed  Labs: ordered. Decision-making details documented in ED  Course.  Radiology: ordered. Decision-making details documented in ED Course.      Risk  Prescription drug management.  Parenteral controlled substances.              ED Course as of 07/26/21 0120   Mon Jul 25, 2021   2354 Call placed to [facility] in attempt to transfer patient as this is where her surgeon has privileges   Tue Jul 26, 2021   0101 Spoke with Dr Jeanella Antoneece who agrees to see patient in transfer. 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  Thickening and fluid noted within the endometrium simply associated with lower uterine endometrial or endocervical lesion and/or stricture. Ultrasound and/or hysteroscopy evaluation recommended.Cystic changes seen of bilateral ovaries       Orders Placed This Encounter    CT ABDOMEN PELVIS W IV CONTRAST    CBC/DIFF    COMPREHENSIVE METABOLIC PANEL, NON-FASTING    C-REACTIVE PROTEIN(CRP),INFLAMMATION    LACTIC ACID LEVEL W/ REFLEX FOR LEVEL >2.0    CBC WITH DIFF    URINALYSIS, MACROSCOPIC AND MICROSCOPIC W/CULTURE REFLEX    URINALYSIS, MACROSCOPIC    URINALYSIS, MICROSCOPIC    EXTRA TUBES    BLUE TOP TUBE    GOLD TOP TUBE    ondansetron (ZOFRAN) 2 mg/mL injection    morphine 4 mg/mL injection    iohexol (OMNIPAQUE 350) infusion    levoFLOXacin (LEVAQUIN) 500 mg in D5W 100 mL premix IVPB    metroNIDAZOLE (FLAGYL) 500 mg in NS 100 mL premix IVPB    morphine 4 mg/mL injection           Impression:   Clinical Impression   Acute diverticulitis (Primary)       Disposition: Transfered to Another Facility      Portions of this note may have been dictated using voice recognition software.     Maryagnes AmosKristin Philbert Ocallaghan  PA-C    -----------------------  Results for orders placed or performed during the hospital encounter of 07/25/21 (from the past 12 hour(s))   COMPREHENSIVE METABOLIC PANEL, NON-FASTING   Result Value Ref Range    SODIUM 140 136 - 145 mmol/L    POTASSIUM 3.7 3.5 - 5.1 mmol/L  CHLORIDE 107 98 - 107 mmol/L    CO2 TOTAL 26 21 - 31 mmol/L    ANION GAP 7 (L) 10 - 20 mmol/L    BUN 5 (L) 7 - 25 mg/dL    CREATININE 4.17 5.30 - 1.30 mg/dL    BUN/CREA RATIO 7 6 - 22    ESTIMATED GFR 104 >59 mL/min/1.8m2    ALBUMIN 4.4 3.5 - 5.7 g/dL    CALCIUM 9.4 8.6 - 10.4 mg/dL    GLUCOSE 96 74 - 045 mg/dL    ALKALINE PHOSPHATASE 51 34 - 104 U/L    ALT (SGPT) 8 7 - 52 U/L    AST (SGOT) 10 (L) 13 - 39 U/L    BILIRUBIN TOTAL 0.4 0.3 - 1.2 mg/dL    PROTEIN TOTAL 7.2 6.4 - 8.9 g/dL    ALBUMIN/GLOBULIN RATIO 1.6 (H) 0.8 - 1.4    OSMOLALITY, CALCULATED 277 270 - 290 mOsm/kg    CALCIUM, CORRECTED 9.0 8.9 - 10.8 mg/dL    GLOBULIN 2.8 (L) 2.9 - 5.4   C-REACTIVE PROTEIN(CRP),INFLAMMATION   Result Value Ref Range    C-REACTIVE PROTEIN (CRP) 1.9 (H) 0.1 - 0.5 mg/dL   LACTIC ACID LEVEL W/ REFLEX FOR LEVEL >2.0   Result Value Ref Range    LACTIC ACID 1.0 0.5 - 2.2 mmol/L   CBC WITH DIFF   Result Value Ref Range    WBCS UNCORRECTED 8.5 x10^3/uL    WBC 8.5 4.0 - 10.5 x103/uL    RBC 4.63 4.20 - 5.40 x106/uL    HGB 14.2 12.5 - 16.0 g/dL    HCT 91.3 68.5 - 99.2 %    MCV 90.0 78.0 - 99.0 fL    MCH 30.7 27.0 - 32.0 pg    MCHC 34.1 32.0 - 36.0 g/dL    RDW 34.1 44.3 - 60.1 %    PLATELETS 289 140 - 440 x103/uL    MPV 8.4 7.4 - 10.4 fL    NEUTROPHIL % 68 40 - 76 %    LYMPHOCYTE % 26 25 - 45 %    MONOCYTE % 6 0 - 12 %    EOSINOPHIL % 0 0 - 7 %    BASOPHIL % 1 0 - 3 %    NEUTROPHIL # 5.70 4.10 - 29.00 x103/uL    LYMPHOCYTE # 2.20 2.10 - 11.00 x103/uL    MONOCYTE # 0.50 0.00 - 4.10 x103/uL    EOSINOPHIL # 0.00 0.00 - 2.40 x103/uL    BASOPHIL # 0.10 0.00 - 2.50 x103/uL   BLUE TOP TUBE   Result Value Ref Range    RAINBOW/EXTRA TUBE AUTO RESULT Yes      CT  ABDOMEN PELVIS W IV CONTRAST   Final Result

## 2021-07-26 LAB — BLUE TOP TUBE

## 2021-07-26 LAB — GOLD TOP TUBE

## 2021-07-26 MED ORDER — FENTANYL (PF) 50 MCG/ML INJECTION SOLUTION
25.0000 ug | INTRAMUSCULAR | Status: AC
Start: 2021-07-26 — End: 2021-07-26
  Administered 2021-07-26: 25 ug via INTRAVENOUS

## 2021-07-26 MED ORDER — FENTANYL (PF) 50 MCG/ML INJECTION SOLUTION
INTRAMUSCULAR | Status: AC
Start: 2021-07-26 — End: 2021-07-26
  Filled 2021-07-26: qty 2

## 2021-07-26 NOTE — ED Nurses Note (Signed)
[facility] here to transport patient at this time.

## 2021-07-26 NOTE — ED Nurses Note (Signed)
[facility] in department to transport [facility] to [facility]

## 2021-07-26 NOTE — ED Nurses Note (Signed)
[facility] contacted for transportation to [facility]

## 2021-07-26 NOTE — ED Nurses Note (Signed)
Called report to Annitta Needs at [facility] ED at this time.

## 2021-10-06 ENCOUNTER — Encounter (HOSPITAL_COMMUNITY): Payer: Self-pay | Admitting: Student in an Organized Health Care Education/Training Program

## 2021-10-06 ENCOUNTER — Emergency Department
Admission: EM | Admit: 2021-10-06 | Discharge: 2021-10-06 | Disposition: A | Discharge status: Home / Self Care | Payer: Medicaid Other | Attending: Student in an Organized Health Care Education/Training Program | Admitting: Student in an Organized Health Care Education/Training Program

## 2021-10-06 ENCOUNTER — Other Ambulatory Visit: Payer: Self-pay

## 2021-10-06 DIAGNOSIS — Z6831 Body mass index (BMI) 31.0-31.9, adult: Secondary | ICD-10-CM

## 2021-10-06 DIAGNOSIS — Z8719 Personal history of other diseases of the digestive system: Secondary | ICD-10-CM

## 2021-10-06 DIAGNOSIS — K9401 Colostomy hemorrhage: Secondary | ICD-10-CM | POA: Insufficient documentation

## 2021-10-06 DIAGNOSIS — Z9049 Acquired absence of other specified parts of digestive tract: Secondary | ICD-10-CM | POA: Insufficient documentation

## 2021-10-06 LAB — CBC WITH DIFF
BASOPHIL #: 0.1 10*3/uL (ref 0.00–0.30)
BASOPHIL %: 1 % (ref 0–3)
EOSINOPHIL #: 0.1 10*3/uL (ref 0.00–0.80)
EOSINOPHIL %: 1 % (ref 0–7)
HCT: 42.6 % (ref 37.0–47.0)
HGB: 14.4 g/dL (ref 12.5–16.0)
LYMPHOCYTE #: 3 10*3/uL (ref 1.10–5.00)
LYMPHOCYTE %: 39 % (ref 25–45)
MCH: 29.9 pg (ref 27.0–32.0)
MCHC: 33.9 g/dL (ref 32.0–36.0)
MCV: 88.2 fL (ref 78.0–99.0)
MONOCYTE #: 0.3 10*3/uL (ref 0.00–1.30)
MONOCYTE %: 4 % (ref 0–12)
MPV: 8.2 fL (ref 7.4–10.4)
NEUTROPHIL #: 4.3 10*3/uL (ref 1.80–8.40)
NEUTROPHIL %: 55 % (ref 40–76)
PLATELETS: 338 10*3/uL (ref 140–440)
RBC: 4.82 10*6/uL (ref 4.20–5.40)
RDW: 14.7 % (ref 11.6–14.8)
WBC: 7.7 10*3/uL (ref 4.0–10.5)
WBCS UNCORRECTED: 7.7 10*3/uL

## 2021-10-06 LAB — COMPREHENSIVE METABOLIC PANEL, NON-FASTING
ALBUMIN/GLOBULIN RATIO: 1.5 — ABNORMAL HIGH (ref 0.8–1.4)
ALBUMIN: 4.4 g/dL (ref 3.5–5.7)
ALKALINE PHOSPHATASE: 60 U/L (ref 34–104)
ALT (SGPT): 11 U/L (ref 7–52)
ANION GAP: 7 mmol/L — ABNORMAL LOW (ref 10–20)
AST (SGOT): 14 U/L (ref 13–39)
BILIRUBIN TOTAL: 0.3 mg/dL (ref 0.3–1.2)
BUN/CREA RATIO: 7 (ref 6–22)
BUN: 6 mg/dL — ABNORMAL LOW (ref 7–25)
CALCIUM, CORRECTED: 8.8 mg/dL — ABNORMAL LOW (ref 8.9–10.8)
CALCIUM: 9.2 mg/dL (ref 8.6–10.3)
CHLORIDE: 109 mmol/L — ABNORMAL HIGH (ref 98–107)
CO2 TOTAL: 26 mmol/L (ref 21–31)
CREATININE: 0.83 mg/dL (ref 0.60–1.30)
ESTIMATED GFR: 91 mL/min/{1.73_m2} (ref >59)
GLOBULIN: 2.9 (ref 2.9–5.4)
GLUCOSE: 83 mg/dL (ref 74–109)
OSMOLALITY, CALCULATED: 280 mOsm/kg (ref 270–290)
POTASSIUM: 3.8 mmol/L (ref 3.5–5.1)
PROTEIN TOTAL: 7.3 g/dL (ref 6.4–8.9)
SODIUM: 142 mmol/L (ref 136–145)

## 2021-10-06 LAB — PT/INR
INR: 1 (ref <=5.00)
PROTHROMBIN TIME: 11.6 seconds (ref 9.8–12.7)

## 2021-10-06 LAB — PTT (PARTIAL THROMBOPLASTIN TIME): APTT: 31.5 seconds (ref 26.0–36.0)

## 2021-10-06 NOTE — ED Triage Notes (Signed)
Pt noted blood in colostomy x 5 days, started out as brighter red but has been increasingly darker since. Pt is scheduled for reversal surgery next week and just wants checked out.     [facility]:  [facility] transport

## 2021-10-06 NOTE — ED Nurses Note (Signed)
Patient discharged home.  AVS reviewed with patient.  A written copy of the AVS and discharge instructions was given to the patient.  Questions sufficiently answered as needed.  Patient encouraged to follow up with PCP as indicated.  In the event of an emergency, patient instructed to call 911 or go to the nearest emergency room.

## 2021-10-06 NOTE — Discharge Instructions (Signed)
Spoke with your general surgeon at [facility] hospital Dr. Haskel Schroeder nurse.  She advise that you follow-up with Dr. Pecola Leisure on Tuesday per Dr. Samara Snide request.  If bleeding does worsen you do need a return emergency department or summoned EMS.  Follow up with your PCP for close CV follow-up.  Return emergency department for new or worsening symptoms that concern you.

## 2021-10-06 NOTE — ED Provider Notes (Signed)
[facility]  ED Primary Provider Note  History of Present Illness   Chief Complaint   Patient presents with   . Bleeding From Stoma     Rebecca Sparks is a 41 y.o. female who had concerns including Bleeding From Stoma.  Arrival: The patient arrived by Ambulance    41 year old female arrives today complaining of blood in colostomy bag.  Patient reports this has been present for 4 days.  Patient reports she has history of perforated bowel secondary to diverticulitis.  Patient was seen at [facility] when she had 33-1/2 inches of her intestines removed and a colostomy bag placed.  Patient is followed by Dr. Inocencio Homes.  Patient does report that she spoke with her surgeon Dr. Ayesha Rumpf who advised her to come to the emergency department follow up with her regarding workup.  Patient has no other somatic complaints at this time.  Patient denies abdominal pain, shortness of breath, nausea, vomiting.        Review of Systems   Pertinent positive and negative ROS as per HPI.  Historical Data   History Reviewed This Encounter: Medical History  Surgical History  Family History  Social History      Physical Exam   ED Triage Vitals [10/06/21 1154]   BP (Non-Invasive) 125/73   Heart Rate 75   Respiratory Rate 16   Temperature 37.1 C (98.8 F)   SpO2 98 %   Weight 93.4 kg (206 lb)   Height 1.727 m (_0 )     Physical Exam  Vitals and nursing note reviewed.   HENT:      Head: Normocephalic and atraumatic.   Cardiovascular:      Rate and Rhythm: Normal rate and regular rhythm.      Pulses: Normal pulses.      Heart sounds: Normal heart sounds.   Pulmonary:      Effort: Pulmonary effort is normal.      Breath sounds: Normal breath sounds.   Abdominal:      General: Abdomen is flat.      Palpations: Abdomen is soft.      Comments: Colostomy bag present with dark blood/stool present        Patient Data     Labs Ordered/Reviewed   COMPREHENSIVE METABOLIC PANEL, NON-FASTING - Abnormal; Notable for the  following components:       Result Value    CHLORIDE 109 (*)     ANION GAP 7 (*)     BUN 6 (*)     ALBUMIN/GLOBULIN RATIO 1.5 (*)     CALCIUM, CORRECTED 8.8 (*)     All other components within normal limits    Narrative:     Estimated Glomerular Filtration Rate (eGFR) is calculated using the CKD-EPI (2021) equation, intended for patients 41 years of age and older. If gender is not documented or "unknown", there will be no eGFR calculation.   PT/INR - Normal    Narrative:     INR OF 2.0-3.0  RECOMMENDED FOR: PROPHYLAXIS/TREATMENT OF VENEOUS THROMBOSIS, PULMONARY EMBOLISM, PREVENTION OF SYSTEMIC EMBOLISM FROM ATRIAL FIBRILATION, MYOCARDIAL INFARCTION.    INR OF 2.5-3.5  RECOMMENDED FOR MECHANICAL PROSTHETIC HEART VALVES, RECURRENT SYSTEMIC EMBOLISM, RECURRENT MYOCARDIAL INFARCTION.     PTT (PARTIAL THROMBOPLASTIN TIME) - Normal   CBC/DIFF    Narrative:     The following orders were created for panel order CBC/DIFF.  Procedure  Abnormality         Status                     ---------                               -----------         ------                     CBC WITH MVEH[[id number]]                                    Final result                 Please view results for these tests on the individual orders.   CBC WITH DIFF     No orders to display     Medical Decision Making        Medical Decision Making  PT INR normal.  CBC and CMP grossly unremarkable for acute concerns.  Did reach out and speak with Dr. Zenia Resides nurse who relayed workup and finding to Dr. Ayesha Rumpf.  Dr. Ayesha Rumpf advise patient is appropriate for discharge and they would be following up with her to schedule appointment for Tuesday.  Relayed this conversation to the patient which the patient was in agreement to.  All questions and concerns addressed.  Strict ED return precautions were given.    Amount and/or Complexity of Data Reviewed  Labs: ordered.                  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

## 2021-10-18 ENCOUNTER — Emergency Department
Admission: EM | Admit: 2021-10-18 | Discharge: 2021-10-18 | Disposition: A | Discharge status: Home / Self Care | Payer: Medicaid Other | Attending: Physician Assistant | Admitting: Physician Assistant

## 2021-10-18 ENCOUNTER — Encounter (HOSPITAL_COMMUNITY): Payer: Self-pay | Admitting: Physician Assistant

## 2021-10-18 ENCOUNTER — Other Ambulatory Visit: Payer: Self-pay

## 2021-10-18 ENCOUNTER — Emergency Department (HOSPITAL_COMMUNITY): Payer: Medicaid Other

## 2021-10-18 DIAGNOSIS — R1031 Right lower quadrant pain: Secondary | ICD-10-CM | POA: Insufficient documentation

## 2021-10-18 DIAGNOSIS — R109 Unspecified abdominal pain: Secondary | ICD-10-CM

## 2021-10-18 DIAGNOSIS — R110 Nausea: Secondary | ICD-10-CM | POA: Insufficient documentation

## 2021-10-18 LAB — CBC WITH DIFF
BASOPHIL #: 0 10*3/uL (ref 0.00–0.30)
BASOPHIL %: 1 % (ref 0–3)
EOSINOPHIL #: 0.1 10*3/uL (ref 0.00–0.80)
EOSINOPHIL %: 1 % (ref 0–7)
HCT: 37.9 % (ref 37.0–47.0)
HGB: 13 g/dL (ref 12.5–16.0)
LYMPHOCYTE #: 1.7 10*3/uL (ref 1.10–5.00)
LYMPHOCYTE %: 18 % — ABNORMAL LOW (ref 25–45)
MCH: 30.3 pg (ref 27.0–32.0)
MCHC: 34.2 g/dL (ref 32.0–36.0)
MCV: 88.6 fL (ref 78.0–99.0)
MONOCYTE #: 0.6 10*3/uL (ref 0.00–1.30)
MONOCYTE %: 6 % (ref 0–12)
MPV: 8.5 fL (ref 7.4–10.4)
NEUTROPHIL #: 7.2 10*3/uL (ref 1.80–8.40)
NEUTROPHIL %: 75 % (ref 40–76)
PLATELETS: 330 10*3/uL (ref 140–440)
RBC: 4.28 10*6/uL (ref 4.20–5.40)
RDW: 14.8 % (ref 11.6–14.8)
WBC: 9.6 10*3/uL (ref 4.0–10.5)
WBCS UNCORRECTED: 9.6 10*3/uL

## 2021-10-18 LAB — HCG, SERUM QUALITATIVE, PREGNANCY: PREGNANCY, SERUM QUALITATIVE: NEGATIVE

## 2021-10-18 LAB — COMPREHENSIVE METABOLIC PANEL, NON-FASTING
ALBUMIN/GLOBULIN RATIO: 1.5 — ABNORMAL HIGH (ref 0.8–1.4)
ALBUMIN: 4.2 g/dL (ref 3.5–5.7)
ALKALINE PHOSPHATASE: 58 U/L (ref 34–104)
ALT (SGPT): 11 U/L (ref 7–52)
ANION GAP: 6 mmol/L — ABNORMAL LOW (ref 10–20)
AST (SGOT): 11 U/L — ABNORMAL LOW (ref 13–39)
BILIRUBIN TOTAL: 0.5 mg/dL (ref 0.3–1.2)
BUN/CREA RATIO: 8 (ref 6–22)
BUN: 7 mg/dL (ref 7–25)
CALCIUM, CORRECTED: 9 mg/dL (ref 8.9–10.8)
CALCIUM: 9.2 mg/dL (ref 8.6–10.3)
CHLORIDE: 105 mmol/L (ref 98–107)
CO2 TOTAL: 30 mmol/L (ref 21–31)
CREATININE: 0.85 mg/dL (ref 0.60–1.30)
ESTIMATED GFR: 88 mL/min/{1.73_m2} (ref >59)
GLOBULIN: 2.8 — ABNORMAL LOW (ref 2.9–5.4)
GLUCOSE: 101 mg/dL (ref 74–109)
OSMOLALITY, CALCULATED: 279 mOsm/kg (ref 270–290)
POTASSIUM: 3.6 mmol/L (ref 3.5–5.1)
PROTEIN TOTAL: 7 g/dL (ref 6.4–8.9)
SODIUM: 141 mmol/L (ref 136–145)

## 2021-10-18 LAB — LIPASE: LIPASE: 15 U/L (ref 11–82)

## 2021-10-18 MED ORDER — SODIUM CHLORIDE 0.9 % IV BOLUS
1000.0000 mL | INJECTION | Status: AC
Start: 2021-10-18 — End: 2021-10-18
  Administered 2021-10-18: 1000 mL via INTRAVENOUS
  Administered 2021-10-18: 0 mL via INTRAVENOUS

## 2021-10-18 MED ORDER — MORPHINE 4 MG/ML INJECTION WRAPPER
INJECTION | INTRAMUSCULAR | Status: AC
Start: 2021-10-18 — End: 2021-10-18
  Filled 2021-10-18: qty 1

## 2021-10-18 MED ORDER — HYDROCODONE 5 MG-ACETAMINOPHEN 325 MG TABLET
1.0000 | ORAL_TABLET | Freq: Four times a day (QID) | ORAL | 0 refills | Status: DC | PRN
Start: 2021-10-18 — End: 2021-10-29

## 2021-10-18 MED ORDER — ONDANSETRON HCL (PF) 4 MG/2 ML INJECTION SOLUTION
INTRAMUSCULAR | Status: AC
Start: 2021-10-18 — End: 2021-10-18
  Filled 2021-10-18: qty 2

## 2021-10-18 MED ORDER — MORPHINE 4 MG/ML INJECTION WRAPPER
4.0000 mg | INJECTION | INTRAMUSCULAR | Status: AC
Start: 2021-10-18 — End: 2021-10-18
  Administered 2021-10-18: 4 mg via INTRAVENOUS

## 2021-10-18 MED ORDER — ONDANSETRON HCL (PF) 4 MG/2 ML INJECTION SOLUTION
4.0000 mg | INTRAMUSCULAR | Status: AC
Start: 2021-10-18 — End: 2021-10-18
  Administered 2021-10-18: 4 mg via INTRAVENOUS

## 2021-10-18 MED ORDER — IOHEXOL 350 MG IODINE/ML INTRAVENOUS SOLUTION
100.0000 mL | INTRAVENOUS | Status: AC
Start: 2021-10-18 — End: 2021-10-18
  Administered 2021-10-18: 100 mL via INTRAVENOUS

## 2021-10-18 NOTE — ED Nurses Note (Signed)
Patient discharged home.  AVS reviewed with patient.  A written copy of the AVS and discharge instructions was given to the patient. Questions sufficiently answered as needed.  Patient encouraged to follow up with PCP as indicated.  In the event of an emergency, patient/care giver instructed to call 911 or go to the nearest emergency room.

## 2021-10-18 NOTE — ED Provider Notes (Signed)
[facility]  ED Primary Provider Note  History of Present Illness   Chief Complaint   Patient presents with   . Abdominal Pain     Rebecca Sparks Docter is a 41 y.o. female who had concerns including Abdominal Pain.  Arrival: The patient arrived by Ambulance    HPI   Patient comes in the emergency department with concern for right lower quadrant tenderness status post colostomy reversal.  She states she had surgery in [place] just a couple days ago.  She reports that the pain is 10/10, constant, radiates to the pubic region and her back and is associated with chills and nausea but no fevers or vomiting.  She denies blood in her stool or her urine and states she had a bowel movement 2 days ago which was soft and has been passing gas since then.  Patient denies any urinary symptoms.    Review of Systems   Review of systems negative except as documented below:  Constitutional:  Chills  GI: Abdominal pain in right lower quadrant and nausea     Historical Data   History Reviewed This Encounter: Medical History  Surgical History  Family History  Social History      Physical Exam   ED Triage Vitals [10/18/21 1143]   BP (Non-Invasive) (!) 133/95   Heart Rate 73   Respiratory Rate 18   Temperature 36.8 C (98.3 F)   SpO2 98 %   Weight    Height      Physical Exam   General: Well appearing, nontoxic, no acute distress  Head: Normocephalic Atraumatic  Eyes:  EOMI  Neck: supple, no meningismus; trachea midline  Chest: Lungs clear to auscultation bilateral  Cardiac: Regular rate and rhythm, no murmurs, rubs or gallops  Abdomen: soft, moderate to severe tenderness of the right lower quadrant, bandage from previous surgery removed and staples and surrounding skin looks normal without redness, heat, purulence, nondistended; no guarding or rebound tenderness, BS present but decreased in 4 quadrants  Musculoskeletal: Moves all fours, no obvious deformities  Skin: No rash, normal skin tone;    Neuro: Alert and Oriented; No focal deficit, CN 2-12 symmetric and intact; memory intact  Psych: Pleasant and Cooperative  Patient Data     Labs Ordered/Reviewed   COMPREHENSIVE METABOLIC PANEL, NON-FASTING - Abnormal; Notable for the following components:       Result Value    ANION GAP 6 (*)     AST (SGOT) 11 (*)     ALBUMIN/GLOBULIN RATIO 1.5 (*)     GLOBULIN 2.8 (*)     All other components within normal limits    Narrative:     Estimated Glomerular Filtration Rate (eGFR) is calculated using the CKD-EPI (2021) equation, intended for patients 23 years of age and older. If gender is not documented or "unknown", there will be no eGFR calculation.   CBC WITH DIFF - Abnormal; Notable for the following components:    LYMPHOCYTE % 18 (*)     All other components within normal limits   LIPASE - Normal   HCG, SERUM QUALITATIVE, PREGNANCY - Normal    Narrative:     [id number]  01/30/2023   CBC/DIFF    Narrative:     The following orders were created for panel order CBC/DIFF.  Procedure  Abnormality         Status                     ---------                               -----------         ------                     CBC WITH QJFH[[id number]]                Abnormal            Final result                 Please view results for these tests on the individual orders.     CT ABDOMEN PELVIS W IV CONTRAST   Final Result by Edi, Radresults In (06/06 1613)   Wall thickening and surrounding fat stranding within the right lower quadrant at the site of recent anastomosis. This may be postsurgical; correlation with surgical history would be helpful. No evidence of obstruction.       Unremarkable appearance of the rectosigmoid anastomosis.         One or more dose reduction techniques were used (e.g., Automated exposure control, adjustment of the mA and/or kV according to patient size, use of iterative reconstruction technique).         Radiologist location ID: [place] Decision  Making        MDM     Differential diagnosis to include:  Acute appendicitis, UTI, surgical site infection, cellulitis         Medications Administered in the ED   NS bolus infusion 1,000 mL (0 mL Intravenous Stopped 10/18/21 1428)   ondansetron (ZOFRAN) 2 mg/mL injection (4 mg Intravenous Given 10/18/21 1320)   morphine 4 mg/mL injection (4 mg Intravenous Given 10/18/21 1320)   iohexol (OMNIPAQUE 350) infusion (100 mL Intravenous Given 10/18/21 1545)     Patient has abdominal pain at surgical site status post colostomy reversal.  She did have bowel movements since colostomy and has not passing gas.  Patient had medication given in the emergency department which significantly improved the pain and nausea and vomiting.  Patient had laboratory studies which showed white blood cell of 9.6 and lipase of 15 and she had a CT scan which did show wall thickening and stranding with possible association from recent surgery.  I was able to call general surgeon on-call, Dr. Tamala Julian with [facility] who encouraged patient to follow up with their practice in 2-3 days and to be discharged with narcotic pain control.  He is not concerned at this time with CT findings.      Clinical Impression   Abdominal pain, unspecified abdominal location (Primary)       Disposition: Discharged  .Renato Gails, PA-C

## 2021-10-18 NOTE — ED Triage Notes (Signed)
Complaining of lower abd pain that radiates to  Lower back and legs.  Colostomy reversal 10/12/21.  Constipation, last BM's 2 days ago not responding to home treatment.  Transport:  Vs, Zofran 4 mg po

## 2021-10-29 ENCOUNTER — Encounter (HOSPITAL_BASED_OUTPATIENT_CLINIC_OR_DEPARTMENT_OTHER): Payer: Self-pay

## 2021-10-29 ENCOUNTER — Other Ambulatory Visit: Payer: Self-pay

## 2021-10-29 ENCOUNTER — Emergency Department
Admission: EM | Admit: 2021-10-29 | Discharge: 2021-10-29 | Disposition: A | Discharge status: Home / Self Care | Payer: Medicaid Other | Attending: FAMILY PRACTICE | Admitting: FAMILY PRACTICE

## 2021-10-29 ENCOUNTER — Emergency Department (HOSPITAL_BASED_OUTPATIENT_CLINIC_OR_DEPARTMENT_OTHER): Payer: Medicaid Other

## 2021-10-29 DIAGNOSIS — Z6831 Body mass index (BMI) 31.0-31.9, adult: Secondary | ICD-10-CM

## 2021-10-29 DIAGNOSIS — R110 Nausea: Secondary | ICD-10-CM

## 2021-10-29 DIAGNOSIS — G8918 Other acute postprocedural pain: Secondary | ICD-10-CM | POA: Insufficient documentation

## 2021-10-29 DIAGNOSIS — K5792 Diverticulitis of intestine, part unspecified, without perforation or abscess without bleeding: Secondary | ICD-10-CM

## 2021-10-29 DIAGNOSIS — R1032 Left lower quadrant pain: Secondary | ICD-10-CM | POA: Insufficient documentation

## 2021-10-29 DIAGNOSIS — F209 Schizophrenia, unspecified: Secondary | ICD-10-CM

## 2021-10-29 LAB — CBC WITH DIFF
BASOPHIL #: 0.01 10*3/uL (ref 0.00–0.30)
BASOPHIL %: 0 % (ref 0–3)
EOSINOPHIL #: 0.04 10*3/uL (ref 0.00–0.80)
EOSINOPHIL %: 1 % (ref 0–7)
HCT: 41.7 % (ref 37.0–47.0)
HGB: 13.7 g/dL (ref 12.5–16.0)
LYMPHOCYTE #: 1.72 10*3/uL (ref 1.10–5.00)
LYMPHOCYTE %: 21 % — ABNORMAL LOW (ref 25–45)
MCH: 29.7 pg (ref 27.0–32.0)
MCHC: 32.8 g/dL (ref 32.0–36.0)
MCV: 90.4 fL (ref 78.0–99.0)
MONOCYTE #: 0.44 10*3/uL (ref 0.00–1.30)
MONOCYTE %: 5 % (ref 0–12)
MPV: 7.9 fL (ref 7.4–10.4)
NEUTROPHIL #: 5.92 10*3/uL (ref 1.80–8.40)
NEUTROPHIL %: 73 % (ref 40–76)
PLATELETS: 357 10*3/uL (ref 140–440)
RBC: 4.61 10*6/uL (ref 4.20–5.40)
RDW: 17.4 % — ABNORMAL HIGH (ref 11.6–14.8)
WBC: 8.1 10*3/uL (ref 4.0–10.5)

## 2021-10-29 LAB — BASIC METABOLIC PANEL
ANION GAP: 11 mmol/L (ref 10–20)
BUN/CREA RATIO: 10
BUN: 9 mg/dL (ref 7–18)
CALCIUM: 9 mg/dL (ref 8.5–10.1)
CHLORIDE: 105 mmol/L (ref 98–107)
CO2 TOTAL: 24 mmol/L (ref 21–32)
CREATININE: 0.92 mg/dL (ref 0.55–1.02)
ESTIMATED GFR: 80 mL/min/{1.73_m2} (ref >59)
GLUCOSE: 108 mg/dL — ABNORMAL HIGH (ref 74–106)
OSMOLALITY, CALCULATED: 279 mOsm/kg (ref 270–290)
POTASSIUM: 3.6 mmol/L (ref 3.5–5.1)
SODIUM: 140 mmol/L (ref 136–145)

## 2021-10-29 LAB — MAGNESIUM: MAGNESIUM: 2 mg/dL (ref 1.8–2.4)

## 2021-10-29 MED ORDER — ONDANSETRON HCL (PF) 4 MG/2 ML INJECTION SOLUTION
4.0000 mg | INTRAMUSCULAR | Status: AC
Start: 2021-10-29 — End: 2021-10-29
  Administered 2021-10-29: 4 mg via INTRAVENOUS

## 2021-10-29 MED ORDER — LACTATED RINGERS INTRAVENOUS SOLUTION
INTRAVENOUS | Status: AC
Start: 2021-10-29 — End: ?

## 2021-10-29 MED ORDER — BUPRENORPHINE HCL 0.3 MG/ML INJECTION SOLUTION
INTRAMUSCULAR | Status: AC
Start: 2021-10-29 — End: 2021-10-29
  Filled 2021-10-29: qty 1

## 2021-10-29 MED ORDER — ONDANSETRON 4 MG DISINTEGRATING TABLET
4.0000 mg | ORAL_TABLET | Freq: Three times a day (TID) | ORAL | 0 refills | Status: DC | PRN
Start: 2021-10-29 — End: 2021-11-05

## 2021-10-29 MED ORDER — SODIUM CHLORIDE 0.9 % (FLUSH) INJECTION SYRINGE
3.0000 mL | INJECTION | INTRAMUSCULAR | Status: DC | PRN
Start: 2021-10-29 — End: 2021-10-30
  Administered 2021-10-29: 0 mL

## 2021-10-29 MED ORDER — SODIUM CHLORIDE 0.9 % (FLUSH) INJECTION SYRINGE
3.0000 mL | INJECTION | Freq: Three times a day (TID) | INTRAMUSCULAR | Status: DC
Start: 2021-10-29 — End: 2021-10-30
  Administered 2021-10-29: 0 mL

## 2021-10-29 MED ORDER — IBUPROFEN 800 MG TABLET
ORAL_TABLET | ORAL | Status: AC
Start: 2021-10-29 — End: 2021-10-29
  Filled 2021-10-29: qty 1

## 2021-10-29 MED ORDER — BUPRENORPHINE HCL 0.3 MG/ML INJECTION SOLUTION
0.1500 mg | INTRAMUSCULAR | Status: AC
Start: 2021-10-29 — End: 2021-10-29
  Administered 2021-10-29: 0.15 mg via INTRAVENOUS

## 2021-10-29 MED ORDER — IBUPROFEN 800 MG TABLET
800.0000 mg | ORAL_TABLET | ORAL | Status: AC
Start: 2021-10-29 — End: 2021-10-29
  Administered 2021-10-29: 800 mg via ORAL

## 2021-10-29 MED ORDER — ONDANSETRON HCL (PF) 4 MG/2 ML INJECTION SOLUTION
INTRAMUSCULAR | Status: AC
Start: 2021-10-29 — End: 2021-10-29
  Filled 2021-10-29: qty 2

## 2021-10-29 NOTE — ED Provider Notes (Signed)
[facility], [facility] Emergency Department  ED Primary Provider Note  History of Present Illness   Chief Complaint   Patient presents with   . Abdominal Pain     Rebecca Sparks is a 41 y.o. female who had concerns including Abdominal Pain.  Arrival: The patient arrived by Car    This 41 year old female patient presents emergency department today with left lower quadrant suprapubic pain that has been severe today, and has been having some left lower quadrant discomfort over the last 2 days.  She has had nausea, sweats and chills but no fever, has still been eating and drinking, having bowel movements and passing gas.  She had a severe diverticulitis flare in March, was transferred to [facility], [place], [place] and had surgery done by Dr. Inocencio Homes, with a diverting colostomy, she had a colostomy reversal on 31 May, did have some pain and initially that did improve several days, but in the last 2 days has recurred in the suprapubic and left lower quadrant respectively.  She does have history schizophrenia and takes Abilify 400 mg IM monthly, and takes Benefiber along with MiraLax for constipation.  She does occasionally use marijuana, last use was over 2 weeks ago, no other illicit drug use, tobacco vaping alcohol.    She is not COVID or influenza vaccinated.        Review of Systems   Pertinent positive and negative ROS as per HPI.  Historical Data   History Reviewed This Encounter:  Past medical surgical social history reviewed and noted.    Physical Exam   ED Triage Vitals [10/29/21 1814]   BP (Non-Invasive) (!) 151/97   Heart Rate 76   Respiratory Rate 16   Temperature 35.9 C (96.7 F)   SpO2 98 %   Weight 95.3 kg (210 lb)   Height 1.727 m (_0 )     Physical Exam   General:  No acute distress, does appear to be in pain uncomfortable.  Nontoxic.  Ears:  TMs clear throat for traction   Nasal:  Pink and moist patent bilaterally   Oral:  Pink moist oropharynx: Pink moist    Neck:  Supple with no accessory muscle use to breathe.    Lungs: Clear symmetrical good aeration   Heart: Regular rate rhythm S1-S2 without murmur gallop   Abdomen:  Soft normal bowel sounds nontender in the upper abdomen, she does have some tenderness in the suprapubic and left lower quadrant abdomen with a mild amount of guarding in the left lower quadrant abdomen.  No referred pain.  Her ostomy site in the right lower abdomen is well healed approximated, staples in place and are to be removed on Tuesday.  Extremities: Moving symmetrically   Skin:  No suspicious rashes lesions   Neurological:  No focal deficits.      Patient Data     Labs Ordered/Reviewed   BASIC METABOLIC PANEL - Abnormal; Notable for the following components:       Result Value    GLUCOSE 108 (*)     All other components within normal limits    Narrative:     Estimated Glomerular Filtration Rate (eGFR) is calculated using the CKD-EPI (2021) equation, intended for patients 46 years of age and older. If gender is not documented or "unknown", there will be no eGFR calculation.   CBC WITH DIFF - Abnormal; Notable for the following components:    RDW 17.4 (*)     LYMPHOCYTE %  21 (*)     All other components within normal limits   MAGNESIUM - Normal   CBC/DIFF    Narrative:     The following orders were created for panel order CBC/DIFF.  Procedure                               Abnormality         Status                     ---------                               -----------         ------                     CBC WITH QIWL[[id number]]                Abnormal            Final result                 Please view results for these tests on the individual orders.     XR KUB AND UPRIGHT ABDOMEN   Final Result by Edi, Radresults In (06/17 1931)   Nonobstructive bowel gas pattern            Radiologist location ID: [place] Making        Medical Decision Making  Moderate complexity due to the patient's comorbidities  presentation.  Differential includes bowel obstruction, sigmoid stenosis, perforated viscus, sepsis, bowel dysmotility.    Amount and/or Complexity of Data Reviewed  Labs: ordered.  Radiology: ordered.      Risk  Prescription drug management.  Parenteral controlled substances.        ED Course as of 10/29/21 1948   Sat Oct 29, 2021   1940 Transitioned over to Dr. Nelson Chimes, night shift provider for diagnosis, continue care and disposition.   1940 Patient seen re-evaluated, labs and x-ray discussed, will discharge to home after her Zofran.  See discharge         Medications Administered in the ED   NS flush syringe (has no administration in time range)   NS flush syringe (has no administration in time range)   LR premix infusion ( Intravenous New Bag/New Syringe 10/29/21 1943)   buprenorphine (BUPRENEX) 0.3 mg/mL injection (0.15 mg Intravenous Given 10/29/21 1944)   ondansetron (ZOFRAN) 2 mg/mL injection (4 mg Intravenous Given 10/29/21 1944)     Clinical Impression   Postoperative left lower quadrant abdominal pain (Primary)   Nausea       Disposition: Discharged    .Marland KitchenBretta Bang, DO

## 2021-10-29 NOTE — ED Nurses Note (Signed)
Pt states that she is tender in R & L LQ; states nothing relieves pain; also has complaint of nausea.

## 2021-10-29 NOTE — ED Nurses Note (Signed)
Pt has received pain medication and did not have a person to drive her home; will continue to monitor pt until she is able to obtain transportation or safe to discharge

## 2021-10-29 NOTE — Discharge Instructions (Signed)
Continue your Tylenol up to 650 mg 4 times a day as needed for pain, you may also use either ibuprofen, or Naprosyn/Aleve, as directed on their bottles but do not use both.  See you may alternate Tylenol with ibuprofen, or alternate Tylenol with Aleve.  Continue other home medications, call Dr. Ashley Jacobs on Monday for follow-up or further direction.  I did give you prescription for Zofran for nausea should you needed.  Continue diet as tolerated per the direction of Dr. Pecola Leisure.

## 2021-10-29 NOTE — ED Triage Notes (Signed)
Lower abdominal cramping and lower back sharp pain and nausea,  Also surgery may 31 she had an ileostomy revseral.

## 2021-10-29 NOTE — ED Nurses Note (Signed)
Pt A&O x4; respirations even and unlabored; ambulated to restroom w/ steady gait.

## 2021-10-29 NOTE — ED Nurses Note (Addendum)
Pt does not have ride home; resting in room; respirations even and unlabored

## 2021-11-05 ENCOUNTER — Emergency Department
Admission: EM | Admit: 2021-11-05 | Discharge: 2021-11-05 | Disposition: A | Discharge status: Home / Self Care | Payer: Medicaid Other | Attending: FAMILY PRACTICE | Admitting: FAMILY PRACTICE

## 2021-11-05 ENCOUNTER — Emergency Department (HOSPITAL_BASED_OUTPATIENT_CLINIC_OR_DEPARTMENT_OTHER): Payer: Medicaid Other

## 2021-11-05 ENCOUNTER — Other Ambulatory Visit: Payer: Self-pay

## 2021-11-05 ENCOUNTER — Encounter (HOSPITAL_BASED_OUTPATIENT_CLINIC_OR_DEPARTMENT_OTHER): Payer: Self-pay

## 2021-11-05 DIAGNOSIS — F209 Schizophrenia, unspecified: Secondary | ICD-10-CM | POA: Insufficient documentation

## 2021-11-05 DIAGNOSIS — Z6831 Body mass index (BMI) 31.0-31.9, adult: Secondary | ICD-10-CM

## 2021-11-05 DIAGNOSIS — R110 Nausea: Secondary | ICD-10-CM | POA: Insufficient documentation

## 2021-11-05 DIAGNOSIS — K5792 Diverticulitis of intestine, part unspecified, without perforation or abscess without bleeding: Secondary | ICD-10-CM | POA: Insufficient documentation

## 2021-11-05 DIAGNOSIS — N83201 Unspecified ovarian cyst, right side: Secondary | ICD-10-CM | POA: Insufficient documentation

## 2021-11-05 DIAGNOSIS — R1032 Left lower quadrant pain: Secondary | ICD-10-CM | POA: Insufficient documentation

## 2021-11-05 DIAGNOSIS — R609 Edema, unspecified: Secondary | ICD-10-CM | POA: Insufficient documentation

## 2021-11-05 DIAGNOSIS — Z8742 Personal history of other diseases of the female genital tract: Secondary | ICD-10-CM | POA: Insufficient documentation

## 2021-11-05 LAB — LIPASE: LIPASE: 79 U/L (ref 73–393)

## 2021-11-05 LAB — LACTIC ACID LEVEL W/ REFLEX FOR LEVEL >2.0: LACTIC ACID: 1.7 mmol/L (ref 0.4–2.0)

## 2021-11-05 LAB — COMPREHENSIVE METABOLIC PANEL, NON-FASTING
ALBUMIN/GLOBULIN RATIO: 1.1 (ref 0.8–1.4)
ALBUMIN: 3.8 g/dL (ref 3.4–5.0)
ALKALINE PHOSPHATASE: 64 U/L (ref 46–116)
ALT (SGPT): 20 U/L (ref <=78)
ANION GAP: 11 mmol/L (ref 10–20)
AST (SGOT): 14 U/L — ABNORMAL LOW (ref 15–37)
BILIRUBIN TOTAL: 0.2 mg/dL (ref 0.2–1.0)
BUN/CREA RATIO: 7
BUN: 6 mg/dL — ABNORMAL LOW (ref 7–18)
CALCIUM, CORRECTED: 9.1 mg/dL
CALCIUM: 8.9 mg/dL (ref 8.5–10.1)
CHLORIDE: 109 mmol/L — ABNORMAL HIGH (ref 98–107)
CO2 TOTAL: 24 mmol/L (ref 21–32)
CREATININE: 0.86 mg/dL (ref 0.55–1.02)
ESTIMATED GFR: 87 mL/min/{1.73_m2} (ref >59)
GLOBULIN: 3.4
GLUCOSE: 97 mg/dL (ref 74–106)
OSMOLALITY, CALCULATED: 284 mOsm/kg (ref 270–290)
POTASSIUM: 3.6 mmol/L (ref 3.5–5.1)
PROTEIN TOTAL: 7.2 g/dL (ref 6.4–8.2)
SODIUM: 144 mmol/L (ref 136–145)

## 2021-11-05 LAB — CBC WITH DIFF
BASOPHIL #: 0.02 10*3/uL (ref 0.00–0.30)
BASOPHIL %: 0 % (ref 0–3)
EOSINOPHIL #: 0.08 10*3/uL (ref 0.00–0.80)
EOSINOPHIL %: 1 % (ref 0–7)
HCT: 40.6 % (ref 37.0–47.0)
HGB: 13.2 g/dL (ref 12.5–16.0)
LYMPHOCYTE #: 2.93 10*3/uL (ref 1.10–5.00)
LYMPHOCYTE %: 37 % (ref 25–45)
MCH: 29.5 pg (ref 27.0–32.0)
MCHC: 32.6 g/dL (ref 32.0–36.0)
MCV: 90.6 fL (ref 78.0–99.0)
MONOCYTE #: 0.36 10*3/uL (ref 0.00–1.30)
MONOCYTE %: 5 % (ref 0–12)
MPV: 8.5 fL (ref 7.4–10.4)
NEUTROPHIL #: 4.46 10*3/uL (ref 1.80–8.40)
NEUTROPHIL %: 57 % (ref 40–76)
PLATELETS: 337 10*3/uL (ref 140–440)
RBC: 4.48 10*6/uL (ref 4.20–5.40)
RDW: 17.2 % — ABNORMAL HIGH (ref 11.6–14.8)
WBC: 7.9 10*3/uL (ref 4.0–10.5)

## 2021-11-05 LAB — URINALYSIS, MACRO/MICRO
BILIRUBIN: NEGATIVE mg/dL
BLOOD: NEGATIVE mg/dL
GLUCOSE: NEGATIVE mg/dL
KETONES: NEGATIVE mg/dL
LEUKOCYTES: NEGATIVE WBCs/uL
NITRITE: NEGATIVE
PH: 5.5 (ref 4.6–8.0)
PROTEIN: NEGATIVE mg/dL
SPECIFIC GRAVITY: 1.01 (ref 1.003–1.035)
UROBILINOGEN: 0.2 mg/dL (ref 0.2–1.0)

## 2021-11-05 MED ORDER — TRAMADOL 50 MG TABLET
1.0000 | ORAL_TABLET | Freq: Four times a day (QID) | ORAL | 0 refills | Status: AC | PRN
Start: 2021-11-05 — End: 2021-11-07

## 2021-11-05 MED ORDER — MORPHINE 4 MG/ML INJECTION WRAPPER
4.0000 mg | INJECTION | INTRAMUSCULAR | Status: AC
Start: 2021-11-05 — End: 2021-11-05
  Administered 2021-11-05: 4 mg via INTRAVENOUS

## 2021-11-05 MED ORDER — ONDANSETRON HCL (PF) 4 MG/2 ML INJECTION SOLUTION
INTRAMUSCULAR | Status: AC
Start: 2021-11-05 — End: 2021-11-05
  Filled 2021-11-05: qty 2

## 2021-11-05 MED ORDER — LACTATED RINGERS INTRAVENOUS SOLUTION
INTRAVENOUS | Status: DC
Start: 2021-11-05 — End: 2021-11-05

## 2021-11-05 MED ORDER — ONDANSETRON HCL (PF) 4 MG/2 ML INJECTION SOLUTION
4.0000 mg | INTRAMUSCULAR | Status: AC
Start: 2021-11-05 — End: 2021-11-05
  Administered 2021-11-05: 4 mg via INTRAVENOUS

## 2021-11-05 MED ORDER — MORPHINE 4 MG/ML INJECTION WRAPPER
INJECTION | INTRAMUSCULAR | Status: AC
Start: 2021-11-05 — End: 2021-11-05
  Filled 2021-11-05: qty 1

## 2021-11-05 NOTE — ED Triage Notes (Signed)
Patient is co vaginal pain ongoing 2 days. Noticed pain with urination but no discharge or bleeding. Pain in abdomen LL and RU abdomen. No n/v, no diarrhea.

## 2021-11-05 NOTE — ED Provider Notes (Signed)
[facility], [facility] Emergency Department  ED Primary Provider Note  History of Present Illness   Chief Complaint   Patient presents with   . Groin Pain   . Difficulty Urinating     Rebecca Sparks is a 41 y.o. female who had concerns including Groin Pain and Difficulty Urinating.  Arrival: The patient arrived by Car    This 41 year old female patient presents emergency department with left lower quadrant into the suprapubic area colicky pain for the last 2 days, very severe in nature intervals with associated nausea, sweats, chills, and radiation of pain into the back.  She denies dysuria, or hematuria and has had no history of ureteral lithiasis or kidney stones.  She has had severe diverticulitis, she had a diverting colostomy in March, reversed 2 weeks ago, also history of ovarian cyst with drainage on the left as well.  She has no vomiting, diarrhea, hematuria, dysuria, or frequency but she does have the sweats chills and pain as described above.  She does have history of schizophrenia, ovarian cyst, and diverticulitis.  She does occasionally smoke marijuana states it has been about a month since her last marijuana use.    Patient is [place] vaccinated but no boosters.        Review of Systems   Pertinent positive and negative ROS as per HPI.  Historical Data   History Reviewed This Encounter:  Patient's past medical, surgical, social histories were reviewed and noted.    Physical Exam   ED Triage Vitals [11/05/21 1554]   BP (Non-Invasive) 135/79   Heart Rate 82   Resp    Temperature 36.6 C (97.8 F)   SpO2 100 %   Weight 95.3 kg (210 lb)   Height 1.727 m ('5\' 8"'$ )     Physical Exam   General:  Patient appears to be in obvious pain, sitting up and can not sit still in the bed.    Neck:  Supple without accessory muscle use to breathe, trachea is in midline.    Lungs:  Clear symmetrical with good aeration   Heart:  Regular rate rhythm S1-S2 without murmur gallop   Abdomen:   Soft, normal bowel sounds, does have some epigastric pain with voluntary guarding, right lower quadrant pain around the area of the take down of the diverting colostomy, and some left lower quadrant suprapubic pain also with guarding.  She is no referred pain.    Extremities:  Moving symmetrically   Skin:  No suspicious rashes lesions   Neurological:  No focal deficits.      Patient Data     Labs Ordered/Reviewed   COMPREHENSIVE METABOLIC PANEL, NON-FASTING - Abnormal; Notable for the following components:       Result Value    CHLORIDE 109 (*)     BUN 6 (*)     AST (SGOT) 14 (*)     All other components within normal limits    Narrative:     Estimated Glomerular Filtration Rate (eGFR) is calculated using the CKD-EPI (2021) equation, intended for patients 86 years of age and older. If gender is not documented or "unknown", there will be no eGFR calculation.   CBC WITH DIFF - Abnormal; Notable for the following components:    RDW 17.2 (*)     All other components within normal limits   URINALYSIS, MACRO/MICRO - Abnormal; Notable for the following components:    COLOR Light Yellow (*)     All other  components within normal limits   LIPASE - Normal   LACTIC ACID LEVEL W/ REFLEX FOR LEVEL >2.0 - Normal   CBC/DIFF    Narrative:     The following orders were created for panel order CBC/DIFF.  Procedure                               Abnormality         Status                     ---------                               -----------         ------                     CBC WITH DIFF[[id number]]                Abnormal            Final result                 Please view results for these tests on the individual orders.   URINALYSIS WITH REFLEX MICROSCOPIC AND CULTURE IF POSITIVE    Narrative:     The following orders were created for panel order URINALYSIS WITH REFLEX MICROSCOPIC AND CULTURE IF POSITIVE.  Procedure                               Abnormality         Status                     ---------                                -----------         ------                     URINALYSIS, MACRO/MICRO[[id number]]      Abnormal            Final result                 Please view results for these tests on the individual orders.     CT ABDOMEN PELVIS WO IV CONTRAST   Final Result by Edi, Radresults In (06/24 1726)   5.6 x 5.7 x 3.8 cm right ovarian cyst which has increased in size compared to 10/18/2021.      Persistent area of mild edema/inflammation within the mesenteric fat of the left hemipelvis without evidence of discrete etiology.      Persistent mild edema/inflammation adjacent to the right lower quadrant bowel anastomosis which has however decreased compared to the prior study.      Improved posterior to changes within the right lower quadrant subcutaneous soft tissues.      Nonacute findings as described.          One or more dose reduction techniques were used (e.g., Automated exposure control, adjustment of the mA and/or kV according to patient size, use of iterative reconstruction technique).         Radiologist location ID: [place]  Making        Medical Decision Making  Complexity due presentation, differential includes ureterolithiasis, nephrolithiasis, sigmoid diverticulitis, peritonitis, ovarian cyst    Amount and/or Complexity of Data Reviewed  Labs: ordered.  Radiology: ordered.      Risk  Prescription drug management.  Parenteral controlled substances.        ED Course as of 11/05/21 1833   Sat Nov 05, 2021   1825 Patient seen and re-evaluated, laboratory data CT discussed.  Has a right ovarian cyst has enlarged since the last CT of the abdomen pelvis, the inflammatory changes and surgical site area on the left has improved.  Will discharge home with 8 tramadol pills, enough to do 2 days where the pain management.  She is call her surgeon, primary care provider for follow-up.         Medications Administered in the ED   LR premix infusion (has no administration in time range)   ondansetron (ZOFRAN) 2  mg/mL injection (4 mg Intravenous Given 11/05/21 1723)   morphine 4 mg/mL injection (4 mg Intravenous Given 11/05/21 1720)     Clinical Impression   Acute left lower quadrant pain (Primary)   Nausea       Disposition: Discharged    .Marland KitchenBretta Bang, DO

## 2021-11-05 NOTE — ED Nurses Note (Signed)
Discharge instructions given to and went over with patient. Understanding stated.  Reports abdominal  pain is tolerable at present time. Ambulated off unit without problem

## 2021-11-05 NOTE — Discharge Instructions (Signed)
Your left lower abdominal pain causes undetermined, the postoperative changes from your surgery the last May your 1st of June has improved since last CT scan.  You do have an ovarian cyst on the right that has enlarged since her last CT scan.  Bland diet, continue home medications and call your primary care provider if needed on Monday for appointment.  You do need to call Dr. Pecola Leisure on Monday as well for follow-up for your left lower and suprapubic abdominal pain.  I have given you 8 tramadol pills at your pharmacy to take 1 every 6 hours if needed for pain.  This may constipate you, saw recommend stool softeners even while taking tramadol.

## 2023-01-24 ENCOUNTER — Other Ambulatory Visit (HOSPITAL_COMMUNITY): Payer: Self-pay | Admitting: PHYSICIAN ASSISTANT

## 2023-01-24 DIAGNOSIS — Z1231 Encounter for screening mammogram for malignant neoplasm of breast: Secondary | ICD-10-CM

## 2023-02-07 ENCOUNTER — Other Ambulatory Visit: Payer: Self-pay

## 2023-02-07 ENCOUNTER — Ambulatory Visit
Admission: RE | Admit: 2023-02-07 | Discharge: 2023-02-07 | Disposition: A | Discharge status: Home / Self Care | Payer: Medicaid Other | Source: Ambulatory Visit | Attending: PHYSICIAN ASSISTANT | Admitting: PHYSICIAN ASSISTANT

## 2023-02-07 ENCOUNTER — Encounter (HOSPITAL_COMMUNITY): Payer: Self-pay

## 2023-02-07 DIAGNOSIS — Z1231 Encounter for screening mammogram for malignant neoplasm of breast: Secondary | ICD-10-CM | POA: Insufficient documentation

## 2023-02-10 DIAGNOSIS — Z1231 Encounter for screening mammogram for malignant neoplasm of breast: Secondary | ICD-10-CM

## 2023-02-14 ENCOUNTER — Other Ambulatory Visit: Payer: Self-pay

## 2023-02-14 ENCOUNTER — Ambulatory Visit: Payer: Medicaid Other | Attending: Obstetrics & Gynecology

## 2023-02-14 DIAGNOSIS — Z0000 Encounter for general adult medical examination without abnormal findings: Secondary | ICD-10-CM | POA: Insufficient documentation

## 2023-02-14 LAB — BASIC METABOLIC PANEL
ANION GAP: 7 mmol/L (ref 4–13)
BUN/CREA RATIO: 10 (ref 6–22)
BUN: 8 mg/dL (ref 7–25)
CALCIUM: 9.2 mg/dL (ref 8.6–10.3)
CHLORIDE: 108 mmol/L — ABNORMAL HIGH (ref 98–107)
CO2 TOTAL: 26 mmol/L (ref 21–31)
CREATININE: 0.81 mg/dL (ref 0.60–1.30)
ESTIMATED GFR: 93 mL/min/{1.73_m2} (ref >59)
GLUCOSE: 202 mg/dL — ABNORMAL HIGH (ref 74–109)
OSMOLALITY, CALCULATED: 285 mosm/kg (ref 270–290)
POTASSIUM: 3.8 mmol/L (ref 3.5–5.1)
SODIUM: 141 mmol/L (ref 136–145)

## 2023-02-14 LAB — CBC WITH DIFF
BASOPHIL #: 0.1 10*3/uL (ref 0.00–0.10)
BASOPHIL %: 1 % (ref 0–1)
EOSINOPHIL #: 0.1 10*3/uL (ref 0.00–0.50)
EOSINOPHIL %: 1 % (ref 1–7)
HCT: 40.7 % (ref 31.2–41.9)
HGB: 13.5 g/dL (ref 10.9–14.3)
LYMPHOCYTE #: 2.5 10*3/uL (ref 1.00–3.00)
LYMPHOCYTE %: 34 % (ref 16–44)
MCH: 30.3 pg (ref 24.7–32.8)
MCHC: 33.2 g/dL (ref 32.3–35.6)
MCV: 91.2 fL (ref 75.5–95.3)
MONOCYTE #: 0.4 10*3/uL (ref 0.30–1.00)
MONOCYTE %: 5 % (ref 5–13)
MPV: 8.3 fL (ref 7.9–10.8)
NEUTROPHIL #: 4.2 10*3/uL (ref 1.85–7.80)
NEUTROPHIL %: 58 % (ref 43–77)
PLATELETS: 307 10*3/uL (ref 140–440)
RBC: 4.47 10*6/uL (ref 3.63–4.92)
RDW: 14.8 % (ref 12.3–17.7)
WBC: 7.2 10*3/uL (ref 3.8–11.8)

## 2023-02-20 ENCOUNTER — Ambulatory Visit
Admit: 2023-02-20 | Discharge: 2023-02-20 | Disposition: A | Discharge status: Home / Self Care | Payer: Medicaid Other | Source: Ambulatory Visit | Attending: Obstetrics & Gynecology | Admitting: Obstetrics & Gynecology

## 2023-02-20 ENCOUNTER — Encounter (HOSPITAL_COMMUNITY)
Disposition: A | Discharge status: Home / Self Care | Payer: Self-pay | Source: Ambulatory Visit | Attending: Obstetrics & Gynecology

## 2023-02-20 ENCOUNTER — Encounter (HOSPITAL_COMMUNITY): Payer: Self-pay | Admitting: Obstetrics & Gynecology

## 2023-02-20 ENCOUNTER — Ambulatory Visit (HOSPITAL_COMMUNITY): Payer: Medicaid Other | Admitting: Anesthesiology

## 2023-02-20 ENCOUNTER — Other Ambulatory Visit: Payer: Self-pay

## 2023-02-20 DIAGNOSIS — J449 Chronic obstructive pulmonary disease, unspecified: Secondary | ICD-10-CM | POA: Insufficient documentation

## 2023-02-20 DIAGNOSIS — Z9049 Acquired absence of other specified parts of digestive tract: Secondary | ICD-10-CM | POA: Insufficient documentation

## 2023-02-20 DIAGNOSIS — E669 Obesity, unspecified: Secondary | ICD-10-CM | POA: Insufficient documentation

## 2023-02-20 DIAGNOSIS — N7011 Chronic salpingitis: Secondary | ICD-10-CM | POA: Insufficient documentation

## 2023-02-20 DIAGNOSIS — Z720 Tobacco use: Secondary | ICD-10-CM | POA: Insufficient documentation

## 2023-02-20 DIAGNOSIS — F419 Anxiety disorder, unspecified: Secondary | ICD-10-CM | POA: Insufficient documentation

## 2023-02-20 DIAGNOSIS — Z8719 Personal history of other diseases of the digestive system: Secondary | ICD-10-CM | POA: Insufficient documentation

## 2023-02-20 DIAGNOSIS — Z6836 Body mass index (BMI) 36.0-36.9, adult: Secondary | ICD-10-CM | POA: Insufficient documentation

## 2023-02-20 DIAGNOSIS — N838 Other noninflammatory disorders of ovary, fallopian tube and broad ligament: Secondary | ICD-10-CM | POA: Insufficient documentation

## 2023-02-20 DIAGNOSIS — F319 Bipolar disorder, unspecified: Secondary | ICD-10-CM | POA: Insufficient documentation

## 2023-02-20 DIAGNOSIS — N8301 Follicular cyst of right ovary: Secondary | ICD-10-CM | POA: Insufficient documentation

## 2023-02-20 HISTORY — DX: Chronic obstructive pulmonary disease, unspecified: J44.9

## 2023-02-20 HISTORY — DX: Bipolar disorder, unspecified: F31.9

## 2023-02-20 LAB — HCG, URINE QUALITATIVE, PREGNANCY: HCG URINE QUALITATIVE: NEGATIVE

## 2023-02-20 SURGERY — LAPAROSCOPY DIAGNOSTIC
Anesthesia: General | Site: Abdomen | Laterality: Right | Wound class: Clean Contaminated Wounds-The respiratory, GI, Genital, or urinary

## 2023-02-20 MED ORDER — FENTANYL (PF) 50 MCG/ML INJECTION SOLUTION
INTRAMUSCULAR | Status: AC
Start: 2023-02-20 — End: 2023-02-20
  Filled 2023-02-20: qty 2

## 2023-02-20 MED ORDER — PROCHLORPERAZINE EDISYLATE 10 MG/2 ML (5 MG/ML) INJECTION SOLUTION
10.0000 mg | Freq: Four times a day (QID) | INTRAMUSCULAR | Status: DC | PRN
Start: 2023-02-20 — End: 2023-02-20

## 2023-02-20 MED ORDER — PROPOFOL 10 MG/ML IV BOLUS
INJECTION | Freq: Once | INTRAVENOUS | Status: DC | PRN
Start: 2023-02-20 — End: 2023-02-20
  Administered 2023-02-20: 200 mg via INTRAVENOUS

## 2023-02-20 MED ORDER — HYDROMORPHONE 2 MG/ML INJECTION WRAPPER
0.2000 mg | INJECTION | INTRAMUSCULAR | Status: DC | PRN
Start: 2023-02-20 — End: 2023-02-20

## 2023-02-20 MED ORDER — ROPIVACAINE (PF) 2 MG/ML (0.2 %) INJECTION SOLUTION
Freq: Once | INTRAMUSCULAR | Status: DC | PRN
Start: 2023-02-20 — End: 2023-02-20
  Administered 2023-02-20: 20 mL via INTRAMUSCULAR

## 2023-02-20 MED ORDER — KETOROLAC 30 MG/ML (1 ML) INJECTION SOLUTION
INTRAMUSCULAR | Status: AC
Start: 2023-02-20 — End: 2023-02-20
  Filled 2023-02-20: qty 1

## 2023-02-20 MED ORDER — LACTATED RINGERS INTRAVENOUS SOLUTION
INTRAVENOUS | Status: DC
Start: 2023-02-20 — End: 2023-02-20

## 2023-02-20 MED ORDER — FAMOTIDINE (PF) 20 MG/2 ML INTRAVENOUS SOLUTION
20.0000 mg | Freq: Once | INTRAVENOUS | Status: AC
Start: 2023-02-20 — End: 2023-02-20
  Administered 2023-02-20: 20 mg via INTRAVENOUS

## 2023-02-20 MED ORDER — DEXAMETHASONE SODIUM PHOSPHATE 4 MG/ML INJECTION SOLUTION
INTRAMUSCULAR | Status: AC
Start: 2023-02-20 — End: 2023-02-20
  Filled 2023-02-20: qty 1

## 2023-02-20 MED ORDER — SODIUM CHLORIDE 0.9 % (FLUSH) INJECTION SYRINGE
3.0000 mL | INJECTION | INTRAMUSCULAR | Status: DC | PRN
Start: 2023-02-20 — End: 2023-02-20

## 2023-02-20 MED ORDER — FAMOTIDINE (PF) 20 MG/2 ML INTRAVENOUS SOLUTION
INTRAVENOUS | Status: AC
Start: 2023-02-20 — End: 2023-02-20
  Filled 2023-02-20: qty 2

## 2023-02-20 MED ORDER — SUCCINYLCHOLINE 20 MG/ML INTRAVENOUS WRAPPER
INJECTION | Freq: Once | INTRAVENOUS | Status: DC | PRN
Start: 2023-02-20 — End: 2023-02-20
  Administered 2023-02-20: 160 mg via INTRAVENOUS

## 2023-02-20 MED ORDER — SODIUM CHLORIDE 0.9 % (FLUSH) INJECTION SYRINGE
3.0000 mL | INJECTION | Freq: Three times a day (TID) | INTRAMUSCULAR | Status: DC
Start: 2023-02-20 — End: 2023-02-20

## 2023-02-20 MED ORDER — DEXAMETHASONE SODIUM PHOSPHATE 4 MG/ML INJECTION SOLUTION
4.0000 mg | Freq: Once | INTRAMUSCULAR | Status: AC
Start: 2023-02-20 — End: 2023-02-20
  Administered 2023-02-20: 4 mg via INTRAVENOUS

## 2023-02-20 MED ORDER — ALBUTEROL SULFATE 2.5 MG/3 ML (0.083 %) SOLUTION FOR NEBULIZATION
2.5000 mg | INHALATION_SOLUTION | Freq: Once | RESPIRATORY_TRACT | Status: DC | PRN
Start: 2023-02-20 — End: 2023-02-20

## 2023-02-20 MED ORDER — SUGAMMADEX 100 MG/ML INTRAVENOUS SOLUTION
Freq: Once | INTRAVENOUS | Status: DC | PRN
Start: 2023-02-20 — End: 2023-02-20
  Administered 2023-02-20: 100 mg via INTRAVENOUS
  Administered 2023-02-20: 300 mg via INTRAVENOUS

## 2023-02-20 MED ORDER — OXYCODONE-ACETAMINOPHEN 5 MG-325 MG TABLET
1.0000 | ORAL_TABLET | ORAL | Status: DC | PRN
Start: 2023-02-20 — End: 2023-02-20
  Administered 2023-02-20: 1 via ORAL
  Filled 2023-02-20: qty 1

## 2023-02-20 MED ORDER — ROCURONIUM 10 MG/ML INTRAVENOUS SOLUTION
Freq: Once | INTRAVENOUS | Status: DC | PRN
Start: 2023-02-20 — End: 2023-02-20
  Administered 2023-02-20: 50 mg via INTRAVENOUS

## 2023-02-20 MED ORDER — FENTANYL (PF) 50 MCG/ML INJECTION WRAPPER
INJECTION | Freq: Once | INTRAMUSCULAR | Status: DC | PRN
Start: 2023-02-20 — End: 2023-02-20
  Administered 2023-02-20: 100 ug via INTRAVENOUS

## 2023-02-20 MED ORDER — ONDANSETRON HCL (PF) 4 MG/2 ML INJECTION SOLUTION
4.0000 mg | INTRAMUSCULAR | Status: DC | PRN
Start: 2023-02-20 — End: 2023-02-20

## 2023-02-20 MED ORDER — LIDOCAINE (PF) 100 MG/5 ML (2 %) INTRAVENOUS SYRINGE
INJECTION | Freq: Once | INTRAVENOUS | Status: DC | PRN
Start: 2023-02-20 — End: 2023-02-20
  Administered 2023-02-20: 100 mg via INTRAVENOUS

## 2023-02-20 MED ORDER — ONDANSETRON HCL (PF) 4 MG/2 ML INJECTION SOLUTION
4.0000 mg | Freq: Once | INTRAMUSCULAR | Status: DC | PRN
Start: 2023-02-20 — End: 2023-02-20

## 2023-02-20 MED ORDER — ONDANSETRON HCL (PF) 4 MG/2 ML INJECTION SOLUTION
4.0000 mg | Freq: Once | INTRAMUSCULAR | Status: AC
Start: 2023-02-20 — End: 2023-02-20
  Administered 2023-02-20: 4 mg via INTRAVENOUS

## 2023-02-20 MED ORDER — ONDANSETRON HCL (PF) 4 MG/2 ML INJECTION SOLUTION
INTRAMUSCULAR | Status: AC
Start: 2023-02-20 — End: 2023-02-20
  Filled 2023-02-20: qty 2

## 2023-02-20 MED ORDER — ROPIVACAINE (PF) 2 MG/ML (0.2 %) INJECTION SOLUTION
INTRAMUSCULAR | Status: AC
Start: 2023-02-20 — End: 2023-02-20
  Filled 2023-02-20: qty 10

## 2023-02-20 MED ORDER — MIDAZOLAM 5 MG/ML INJECTION WRAPPER
INTRAMUSCULAR | Status: AC
Start: 2023-02-20 — End: 2023-02-20
  Filled 2023-02-20: qty 1

## 2023-02-20 MED ORDER — LACTATED RINGERS INTRAVENOUS SOLUTION
INTRAVENOUS | Status: DC
Start: 2023-02-20 — End: 2023-02-20
  Administered 2023-02-20: 0 via INTRAVENOUS

## 2023-02-20 MED ORDER — KETOROLAC 30 MG/ML (1 ML) INJECTION SOLUTION
15.0000 mg | Freq: Four times a day (QID) | INTRAMUSCULAR | Status: DC | PRN
Start: 2023-02-20 — End: 2023-02-20
  Administered 2023-02-20: 15 mg via INTRAVENOUS

## 2023-02-20 MED ORDER — MIDAZOLAM 5 MG/ML INJECTION WRAPPER
2.0000 mg | Freq: Once | INTRAMUSCULAR | Status: DC | PRN
Start: 2023-02-20 — End: 2023-02-20
  Administered 2023-02-20: 2 mg via INTRAVENOUS

## 2023-02-20 MED ORDER — FENTANYL (PF) 50 MCG/ML INJECTION WRAPPER
25.0000 ug | INJECTION | INTRAMUSCULAR | Status: DC | PRN
Start: 2023-02-20 — End: 2023-02-20

## 2023-02-20 MED ORDER — FENTANYL (PF) 50 MCG/ML INJECTION WRAPPER
50.0000 ug | INJECTION | INTRAMUSCULAR | Status: DC | PRN
Start: 2023-02-20 — End: 2023-02-20

## 2023-02-20 MED ORDER — PROCHLORPERAZINE EDISYLATE 10 MG/2 ML (5 MG/ML) INJECTION SOLUTION
5.0000 mg | Freq: Once | INTRAMUSCULAR | Status: DC | PRN
Start: 2023-02-20 — End: 2023-02-20

## 2023-02-20 MED ORDER — IPRATROPIUM 0.5 MG-ALBUTEROL 3 MG (2.5 MG BASE)/3 ML NEBULIZATION SOLN
3.0000 mL | INHALATION_SOLUTION | Freq: Once | RESPIRATORY_TRACT | Status: DC | PRN
Start: 2023-02-20 — End: 2023-02-20

## 2023-02-20 MED ORDER — DEXMEDETOMIDINE 100 MCG/ML INTRAVENOUS SOLUTION
INTRAVENOUS | Status: AC
Start: 2023-02-20 — End: 2023-02-20
  Filled 2023-02-20: qty 2

## 2023-02-20 MED ORDER — OXYCODONE-ACETAMINOPHEN 5 MG-325 MG TABLET
2.0000 | ORAL_TABLET | ORAL | Status: DC | PRN
Start: 2023-02-20 — End: 2023-02-20

## 2023-02-20 SURGICAL SUPPLY — 43 items
ADH SKNCLS CYNCRLT EXOFIN HVSC STRL TISS LF  DISP 1G (MED SURG SUPPLIES) ×2 IMPLANT
BAG DRAIN 2000ML ANTIREFLUX TWR SLIDE TAP PORT BLUNT CANN LF (UROLOGICAL SUPPLIES) ×2 IMPLANT
BLADE 11 2 END CBNSTL SURG STRL DISP (SURGICAL CUTTING SUPPLIES) ×2 IMPLANT
BLADE SURG CLPR W 37.2MM GP EXIST HNDL GTT IN CHRG .23MM NONST LF  DISP (MED SURG SUPPLIES) IMPLANT
CANNULA LAPSCP 5MM 100MM VERSAONE STD UNIV FIX BLDLS DLPHN NOSE TIP LOW PROF STRL LF  DISP (ENDOSCOPIC SUPPLIES) ×2 IMPLANT
CATH URETH DOVER 16FR FOLEY 2W LRG SMOOTH DRAIN EYE FIRM TIP SIL 10ML STRL LF  BLU STRP CLR (UROLOGICAL SUPPLIES) ×2 IMPLANT
CONV USE 339067 - DRAPE ABS FENESTRATE ADH 121X102X77IN ABDOMINAL 12IN 13IN PRXM LF  STRL DISP SURG SMS 20X36IN (DRAPE/PACKS/SHEETS/OR TOWEL) ×2
CONV USE 404705 - PACK SURG SIRUS OB III REINF TBL CVR GRAD UNDERBUTTOCK STRL 76X44IN 46X33.5IN POLY LF (MED SURG SUPPLIES) ×2
CONV USE ITEM 107364 - COVER STAND 54X23IN MAYO REG FBRC REINF STD TLSCP FOLD STRL (DRAPE/PACKS/SHEETS/OR TOWEL) ×2 IMPLANT
CONV USE ITEM 321997 - GLOVE SURG 7 LF PF BEAD CUF SMOOTH STRL WHT 12 IN (GLOVES AND ACCESSORIES) IMPLANT
CONV USE ITEM 337687 - DRAPE REINF FNFLD 90X44IN LF  STRL DISP SURG (DRAPE/PACKS/SHEETS/OR TOWEL) ×2
COUNTER 20 CNT BLOCK ADH NEEDLE STRL LF  RD SHARP FOAM 15.75X11.5X14IN DISP (MED SURG SUPPLIES) ×2 IMPLANT
COVER STAND 54X23IN MAYO REG FBRC REINF STD TLSCP FOLD STRL (DRAPE/PACKS/SHEETS/OR TOWEL) ×2
DETERGENT INSTR 22OZ TRNSPT GEL RINSE FREE NEUT PH PREKLENZ CLR PLSNT LF (MISCELLANEOUS PT CARE ITEMS) ×2 IMPLANT
DEVICE LAPSCP VOYANT MARYLAND 37CM 1 ACT CURVE JAW DSCT TIP FUS  7MM 18MM 20MM 5MM TROCAR (ENDOSCOPIC SUPPLIES) ×2 IMPLANT
DEVICE SPEC RETR INZII 10MM GUIDE BEAD STD ENDOS 225ML LF (ENDOSCOPIC SUPPLIES) ×2 IMPLANT
DEVICE SUT CROSSBOW GUIDE SUT PSR FC CLSR (ENDOSCOPIC SUPPLIES) ×2
DEVICE SUTURE CROSSBOW GUIDE SUT PSR FC CLSR (ENDOSCOPIC SUPPLIES) ×2 IMPLANT
DRAPE ABS FENESTRATE ADH 121X102X77IN ABDOMINAL 12IN 13IN PRXM LF  STRL DISP SURG SMS 20X36IN (DRAPE/PACKS/SHEETS/OR TOWEL) ×2 IMPLANT
DRAPE REINF FNFLD 90X44IN LF  STRL DISP SURG (DRAPE/PACKS/SHEETS/OR TOWEL) ×2 IMPLANT
GLOVE SURG 6.5 LF  PF SMOOTH BEAD CUF INTLK STRL BLU 11.3IN PROTEXIS NEU-THERA PLISPRN THK7.9 MIL (GLOVES AND ACCESSORIES) IMPLANT
GLOVE SURG 7.5 LF  PF SMOOTH BEAD CUF INTLK STRL BLU 11.8IN PROTEXIS NEU-THERA PLISPRN THK7.9 MIL (GLOVES AND ACCESSORIES) ×2 IMPLANT
GLOVE SURG 8 LTX PF SMOOTH BEAD CUF STRL YW 12IN PROTEXIS NEU-THERA DDRGL THK8.7 MIL (GLOVES AND ACCESSORIES) ×2 IMPLANT
GOWN SURG 2XL XLNG LGTH L3 HKLP CLSR RGLN SLEEVE TWL STRL LF  DISP GRN AERO BLU PRFRM FBRC (DRAPE/PACKS/SHEETS/OR TOWEL) ×2 IMPLANT
GOWN SURG LRG STD LGTH REG L3 NONREINFORCE BRTHBL TWL STRL LF  DISP BLU HALYARD SPECTRUM SMS (DRAPE/PACKS/SHEETS/OR TOWEL) IMPLANT
GOWN SURG XL STD LGTH L3 NONREINFORCE HKLP CLSR TWL STRL LF DISP BLU SPECTRUM SMS (DRAPE/PACKS/SHEETS/OR TOWEL) ×2 IMPLANT
JELLY LUB EZ BCTRST WATER SOL NGRS FLIPTOP TUBE STRL 4OZ LF (MED SURG SUPPLIES) IMPLANT
LABEL MED CORRECT MED LABELING SYS 4 FLG 2 SHEET 24 PRPRNT STRL (MED SURG SUPPLIES) ×2 IMPLANT
MANIPULATR SURG 4.5MM ZUMI UT MN DILATION UNQ HNDL LESS TRMA CURVE STRL DISP LAPSCP MINILAPAROTOMY (MED SURG SUPPLIES) ×2 IMPLANT
NEEDLE HYPO  23GA 1.5IN TW PRCSNGL SS POLYPROP REG BVL LL HUB DEHP-FR TRQS STRL LF  DISP (MED SURG SUPPLIES) ×4 IMPLANT
PACK SURG SIRUS OB III REINF TBL CVR GRAD UNDERBUTTOCK STRL 76X44IN 46X33.5IN POLY LF (MED SURG SUPPLIES) ×2 IMPLANT
SET TUBING PNEUMOCLEAR HIFLO SMOKE EVAC (MED SURG SUPPLIES) ×2 IMPLANT
SOL ANFG DFGR ISOPRPNL PAD OVAL BTL NABRSV ADH STRL LF  DISP (ENDOSCOPIC SUPPLIES) ×2 IMPLANT
SOL IRRG 0.9% NACL 1000ML PLASTIC PR BTL ISTNC N-PYRG STRL LF (MEDICATIONS/SOLUTIONS) ×2 IMPLANT
SOL PREP 10% PVP IOD 4OZ ANSEP SCREW TOP BTL LIQUID LF  DRK BRN (MED SURG SUPPLIES) ×2 IMPLANT
SOL SURG PREP 26ML DRPRP 74% ISPRP 0.7% IOD POVACRYLEX SLF CNTN APPL SKIN STRL PREOP (MED SURG SUPPLIES) ×2 IMPLANT
SUTURE 0 CT2 VICRYL+ 27IN VIOL BRD ANBCTRL COAT ABS (SUTURE/WOUND CLOSURE) ×2 IMPLANT
SUTURE 2-0 CT VICRYL+ 27IN UNDYED BRD ANBCTRL COAT ABS (SUTURE/WOUND CLOSURE) ×2 IMPLANT
SUTURE 4-0 PS2 MONOCRYL MTPS 27IN UNDYED MONOF ABS (SUTURE/WOUND CLOSURE) ×2 IMPLANT
SYRINGE LL 10ML LF  STRL GRAD N-PYRG DEHP-FR PVC FREE MED DISP (MED SURG SUPPLIES) ×6 IMPLANT
TOWEL 24X16IN COTTON BLU DISP SURG STRL LF (DRAPE/PACKS/SHEETS/OR TOWEL) ×2 IMPLANT
TROCAR LAPSCP STD 100MM 11MM VERSAONE FIX CANN BLDLS DLPHN NOSE TIP OPTC STRL LF  DISP (ENDOSCOPIC SUPPLIES) ×2 IMPLANT
TROCAR LAPSCP STD 100MM 5MM VERSAONE FIX CANN BLDLS DLPHN NOSE TIP OPTC STRL LF  DISP (ENDOSCOPIC SUPPLIES) ×2 IMPLANT

## 2023-02-20 NOTE — Anesthesia Transfer of Care (Signed)
ANESTHESIA TRANSFER OF CARE   Rebecca Sparks is a 42 y.o. ,female, Weight: 109 kg (240 lb)   had Procedure(s):  LAPAROSCOPIC RIGHT SALPINGO-OOPHORECTOMY  performed  02/20/23   Primary Service: Tobie Poet*    Past Medical History:   Diagnosis Date   . Bipolar disorder, unspecified (CMS HCC)    . Chronic obstructive airway disease (CMS HCC)    . Schizophrenia (CMS HCC)       Allergy History as of 02/20/23        No Known Allergies                  I completed my transfer of care / handoff to the receiving personnel during which we discussed:  Access, Airway, All key/critical aspects of case discussed, Analgesia, Antibiotics, Gave opportunity for questions and acknowledgement of understanding, Fluids/Product, Expectation of post procedure, Labs and PMHx      Post Location: PACU                                                           Last OR Temp: Temperature: 36.3 C (97.3 F)  ABG:  POTASSIUM   Date Value Ref Range Status   02/14/2023 3.8 3.5 - 5.1 mmol/L Final     KETONES   Date Value Ref Range Status   11/05/2021 Negative Negative mg/dL Final     CALCIUM   Date Value Ref Range Status   02/14/2023 9.2 8.6 - 10.3 mg/dL Final     Airway:* No LDAs found *  Blood pressure (!) 120/91, pulse 79, temperature 36.3 C (97.3 F), resp. rate (!) 10, height 1.727 m (5\' 8" ), weight 109 kg (240 lb), SpO2 100%.

## 2023-02-20 NOTE — Discharge Instructions (Addendum)
You will follow up in office on October 22nd 3:30 PM    You may shower tomorrow.     NO tub baths, swimming pools, or hot tubs until Dr. Vida Rigger clears you.     You are on complete vaginal rest. Nothing in or around the pelvis until Dr. Vida Rigger clears you.    If you experience a fever greater than 100.4, chills, foul smelling drainage, or uncontrollable bleeding (saturating more than one pad per hour) please call Dr. Harl Favor office. If it is after hours or the weekend please go to your nearest ER.     You may resume your home medications as prescribed.

## 2023-02-20 NOTE — Anesthesia Postprocedure Evaluation (Signed)
Anesthesia Post Op Evaluation    Patient: Rebecca Sparks  Procedure(s):  LAPAROSCOPIC RIGHT SALPINGO-OOPHORECTOMY    Last Vitals:Temperature: 36.3 C (97.3 F) (02/20/23 1330)  Heart Rate: 80 (02/20/23 1333)  BP (Non-Invasive): 104/79 (02/20/23 1333)  Respiratory Rate: 16 (02/20/23 1333)  SpO2: 100 % (02/20/23 1333)    No notable events documented.    Patient is sufficiently recovered from the effects of anesthesia to participate in the evaluation and has returned to their pre-procedure level.  Patient location during evaluation: PACU       Patient participation: complete - patient participated  Level of consciousness: awake and alert and responsive to verbal stimuli    Pain management: adequate  Airway patency: patent    Anesthetic complications: no  Cardiovascular status: acceptable  Respiratory status: acceptable  Hydration status: acceptable  Patient post-procedure temperature: Pt Normothermic   PONV Status: Absent

## 2023-02-20 NOTE — H&P (Signed)
Document sent from office to be scanned in

## 2023-02-20 NOTE — Interval H&P Note (Signed)
[facility]       H&P UPDATE FORM                                                                                    Patient's Name: Tula Schryver Propps 42 y.o. female  Date of Admission:  02/20/2023  Patient's Date of Birth: 1981/03/17    02/20/2023       H & P updated the day of the procedure.  1.  H&P completed within 30 days of surgical procedure  and has been reviewed within 24 hours of the surgery, the patient has been examined, and no change has occured in the patients condition since the H&P was completed.       Change in medications: No    2.  Patient continues to be appropriate candidate for planned surgical procedure. YES        Budd Palmer, DO, PhD, [facility]

## 2023-02-20 NOTE — OR Surgeon (Signed)
[facility]  Operative Note    Patient Name: Makenna, Macaluso  Hospital Number: [id number]  Date of Service: 02/20/2023   Date of Birth: [birth year]      Pre-Operative Diagnosis: RIGHT OVARIAN CYST     Post-Operative Diagnosis: RIGHT OVARIAN CYST    Procedure(s)/Description:  LAPAROSCOPIC RIGHT SALPINGO-OOPHORECTOMY: 49320 (CPT)     Findings: RIGHT OVARIAN CYST normal-appearing external female genitalia, vaginal mucosa, cervix.  Uterus sounded to 7 cm.  On laparoscopy the abdominal survey was normal in appearance.  There was no scarring of the bowel from the previous laparoscopy for resection of colon.  There were omental adhesions in the posterior cul-de-sacs in the midline to the uterus.  This encompassed the adhesions to the right ovary.  The left ovary was normal in appearance.  The right ovary was enlarged.  Bilateral tubes were status post tubal sterilization with Hulka clips in subsequent hydrosalpinx.    Attending Surgeon: Budd Palmer, DO     Anesthesia:  Anesthesiologist: Cornett, Mayme Genta, MD  CRNA: Levi Aland, CRNA    Anesthesia Type: .General     Estimated Blood Loss:  Minimal    Specimens Removed:   ID Type Source Tests Collected by Time Destination   1 : RIGHT OVARY AND FALLOPIAN TUBE Tissue Ovary SURGICAL PATHOLOGY SPECIMEN Budd Palmer, DO 02/20/2023 1259       Order Name Source Comment Collection Info Order Time   HCG, URINE QUALITATIVE, PREGNANCY Urine, Site not specified  Collected By: Kandis Cocking, RN 02/20/2023  7:15 AM     Release to patient   Automated        SURGICAL PATHOLOGY SPECIMEN Ovary Pre-op diagnosis:  RIGHT OVARIAN CYST Collected By: Budd Palmer, DO 02/20/2023  1:02 PM     Release to patient   Automated             Complications:  None      Condition: stable    Disposition: PACU - hemodynamically stable.    Description of Procedure:    After confirming validity of the consents, the patient was transported to the operating room  and placed on the operating table.  After adequate general anesthesia, the patient's legs were placed in stirrups and she was sterilely prepped and draped in the usual fashion.  Surgical timeout was performed.  Sims and Deaver was inserted in the vagina to visualize the cervix.  I then grasped on the anterior lip using a single-tooth tenaculum and The Rumi uterine manipulator was inserted into the uterus and a single-tooth tenaculum was removed.  A Foley catheter was inserted into the bladder.  Attention was turned to the abdomen where the umbilicus was identified and incised for a 5 mm trocar which was inserted under direct visualization and pneumoperitoneum was created to 15 mmHg with CO2.  The trocar sheath was left in place and the trocar was removed.  The left lower quadrant was identified 2 fingerbreadths above and 2 fingerbreadths medial to the ASIS and the trocar was inserted under direct visualization.  The umbilicus was identified and injected with 10 cc of 0.2% ropivacaine diluted one-to-one with normal saline.  This was incised for a 10 mm trocar which was inserted under direct visualization.  The patient was placed in Trendelenburg.  The findings were as noted above.  The camera scope was placed in the umbilical port and the DIRECTV and Voyant bipolar forceps were introduced through the left lower and left upper quadrant ports respectively.  The uterus was anteverted in the posterior adhesions were taken down using the bipolar forceps.  The ovary was freed from the underlying adhesions using the bipolar forceps.  The IP ligament was identified and grasped with the bipolar forceps.  The ureter was identified and noted to be distal and away from the area of intended coagulation.  This area was then coagulated and transected and carried down the peritoneum to the utero-ovarian junction which was then grasped coagulated and transected freeing the ovary.  The fallopian tube at the uterine tubal  junction was then identified grasped coagulated and transected freeing the entire specimen from the sidewall and uterus.  This was then placed in an Endo-Catch bag and extracted up through the umbilical port.  The umbilical incision was extended and the specimen was removed.  The fascia was closed using 0 Vicryl in a crossbow.  The pneumoperitoneum was deflated after removing the remainder of the instruments.  The ports were then removed.  The incisions were closed using 4-0 Monocryl and surgical glue.  The Foley catheter and uterine manipulator removed.  The patient was awakened transported to PACU in stable condition.  All sponge and instrument counts were correct x3.    Budd Palmer D.O., Ph.D., [facility]

## 2023-02-20 NOTE — Anesthesia Preprocedure Evaluation (Signed)
ANESTHESIA PRE-OP EVALUATION  Planned Procedure: DIAGNOSTIC LAPAROSCOPY; LAPAROSCOPIC RIGHT OOPHORECTOMY (Abdomen)  Review of Systems     anesthesia history negative     patient summary reviewed  nursing notes reviewed        Pulmonary   COPD, mild, current smoker and Smoked in last 24 hours,   Cardiovascular  negative cardio ROS,   No peripheral edema,  Exercise Tolerance: > or = 4 METS        GI/Hepatic/Renal   negative GI/hepatic/renal ROS,         Endo/Other    obesity,      Neuro/Psych/MS    psychiatric history, bipolar disorder     Cancer    negative hematology/oncology ROS,               Physical Assessment      Airway       Mallampati: II    TM distance: >3 FB    Neck ROM: full  Mouth Opening: good.            Dental       Dentition intact             Pulmonary    Breath sounds clear to auscultation  (-) no rhonchi, no decreased breath sounds, no wheezes, no rales and no stridor     Cardiovascular    Rhythm: regular  Rate: Normal  (-) no friction rub, carotid bruit is not present, no peripheral edema and no murmur     Other findings          Plan  ASA 3     Planned anesthesia type: general     general anesthesia with endotracheal tube intubation      PONV Plan:  I plan to administer pharmcologic prophalaxis antiemetics                  Anesthesia issues/risks discussed are: Dental Injuries, Stroke, Aspiration, Cardiac Events/MI, Intraoperative Awareness/ Recall and Sore Throat.  Anesthetic plan and risks discussed with patient  signed consent obtained          Patient's NPO status is appropriate for Anesthesia.           Plan discussed with CRNA.

## 2023-02-21 DIAGNOSIS — N8301 Follicular cyst of right ovary: Secondary | ICD-10-CM

## 2023-02-21 DIAGNOSIS — N838 Other noninflammatory disorders of ovary, fallopian tube and broad ligament: Secondary | ICD-10-CM

## 2023-02-21 LAB — SURGICAL PATHOLOGY SPECIMEN

## 2023-05-18 ENCOUNTER — Emergency Department (HOSPITAL_COMMUNITY): Payer: 59

## 2023-05-18 ENCOUNTER — Emergency Department
Admission: EM | Admit: 2023-05-18 | Discharge: 2023-05-18 | Disposition: A | Discharge status: Home / Self Care | Payer: 59 | Attending: Family | Admitting: Family

## 2023-05-18 ENCOUNTER — Other Ambulatory Visit: Payer: Self-pay

## 2023-05-18 DIAGNOSIS — S1081XA Abrasion of other specified part of neck, initial encounter: Secondary | ICD-10-CM

## 2023-05-18 DIAGNOSIS — S1091XA Abrasion of unspecified part of neck, initial encounter: Secondary | ICD-10-CM | POA: Insufficient documentation

## 2023-05-18 DIAGNOSIS — M542 Cervicalgia: Secondary | ICD-10-CM

## 2023-05-18 DIAGNOSIS — T71193A Asphyxiation due to mechanical threat to breathing due to other causes, assault, initial encounter: Secondary | ICD-10-CM | POA: Insufficient documentation

## 2023-05-18 DIAGNOSIS — Z043 Encounter for examination and observation following other accident: Secondary | ICD-10-CM | POA: Insufficient documentation

## 2023-05-18 NOTE — ED Nurses Note (Signed)
Patient discharged home.  AVS reviewed with patient.  A written copy of the AVS and discharge instructions was given to the patient.  Questions sufficiently answered as needed.  Patient encouraged to follow up with PCP as indicated.  In the event of an emergency, patient instructed to call 911 or go to the nearest emergency room.
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
## 2023-05-18 NOTE — Discharge Instructions (Signed)
 Unless otherwise specified you should typically follow-up with your PCP or specialist within the next 2 days.     Some abnormal test results may not relate specifically to problems addressed today but may benefit from further testing and followup with your doctor.     It is very important that you follow up with your doctor to discuss all of your results from today's visit.  Discuss any new medications prescribed to be sure these do not interact with any you may already be taking.  Do not drive or operate heavy machinery while taking any pain medication or muscle relaxers.     Please return immediately if your condition worsens, further concerns arise, or if you have trouble getting a followup appointment.

## 2023-07-31 ENCOUNTER — Encounter (HOSPITAL_COMMUNITY): Payer: Self-pay | Admitting: Emergency Medicine

## 2023-07-31 ENCOUNTER — Other Ambulatory Visit: Payer: Self-pay

## 2023-07-31 ENCOUNTER — Emergency Department (HOSPITAL_COMMUNITY)

## 2023-07-31 ENCOUNTER — Emergency Department
Admission: EM | Admit: 2023-07-31 | Discharge: 2023-07-31 | Disposition: A | Discharge status: Home / Self Care | Attending: Emergency Medicine | Admitting: Emergency Medicine

## 2023-07-31 DIAGNOSIS — F17290 Nicotine dependence, other tobacco product, uncomplicated: Secondary | ICD-10-CM | POA: Insufficient documentation

## 2023-07-31 DIAGNOSIS — Z1152 Encounter for screening for COVID-19: Secondary | ICD-10-CM | POA: Insufficient documentation

## 2023-07-31 DIAGNOSIS — J069 Acute upper respiratory infection, unspecified: Secondary | ICD-10-CM | POA: Insufficient documentation

## 2023-07-31 DIAGNOSIS — J40 Bronchitis, not specified as acute or chronic: Secondary | ICD-10-CM | POA: Insufficient documentation

## 2023-07-31 DIAGNOSIS — B9789 Other viral agents as the cause of diseases classified elsewhere: Secondary | ICD-10-CM | POA: Insufficient documentation

## 2023-07-31 LAB — COVID-19, FLU A/B, RSV RAPID BY PCR
INFLUENZA VIRUS TYPE A: NOT DETECTED
INFLUENZA VIRUS TYPE B: NOT DETECTED
RESPIRATORY SYNCTIAL VIRUS (RSV): NOT DETECTED
SARS-CoV-2: NOT DETECTED

## 2023-07-31 LAB — RAPID THROAT SCREEN, STREPTOCOCCUS, WITH REFLEX: THROAT RAPID SCREEN, STREPTOCOCCUS: NEGATIVE

## 2023-07-31 MED ORDER — METHYLPREDNISOLONE ACETATE 80 MG/ML SUSPENSION FOR INJECTION
80.0000 mg | Freq: Once | INTRAMUSCULAR | Status: AC
Start: 2023-07-31 — End: 2023-07-31
  Administered 2023-07-31: 80 mg via INTRAMUSCULAR

## 2023-07-31 MED ORDER — METHYLPREDNISOLONE ACETATE 80 MG/ML SUSPENSION FOR INJECTION
INTRAMUSCULAR | Status: AC
Start: 2023-07-31 — End: 2023-07-31
  Filled 2023-07-31: qty 1

## 2023-07-31 NOTE — ED Triage Notes (Signed)
 COUGH/CONGESTION, SORE THROAT, BODY ACHES X 2 DAYS. EXPOSED TO FLU B.

## 2023-07-31 NOTE — ED Provider Notes (Signed)
 [facility]  ED Primary Provider Note        Arrival: The patient arrived by Car     History of Present Illness   chief complaint  Rebecca Sparks is a 43 y.o. female who had concerns including Flu Like Symptoms and Sore Throat.  Patient 43 year old female that presents emergency room with a chief complaint of nasal congestion, sore throat, productive cough with a green sputum times 2 days.  She states she has been around somebody with the flu B.  Had flu A several weeks ago.  Is has been using promethazine DM.  Patient does vape.  Is denying any chest pain or shortness of breath.  No other complaints.  Blood pressure 139/91 with a heart rate of 110, respiratory rate of 18 oxygen saturation 97% on room air.  Afebrile at 97.2  All nursing notes reviewed        Review of Systems     No other overt Review of Systems are noted to be positive except noted in the HPI.      Historical Data   History Reviewed This Encounter: Medical History  Surgical History  Family History  Social History      Physical Exam   ED Triage Vitals [07/31/23 1824]   BP (Non-Invasive) (!) 139/91   Heart Rate (!) 110   Respiratory Rate 18   Temperature 36.2 C (97.2 F)   SpO2 97 %   Weight 109 kg (240 lb)   Height 1.753 m (5\' 9" )         Exam:   Constitutional:  Patient alert orient x3 in no apparent distress.  No limitations.  Head: Atraumatic normocephalic  Eyes :  Pupils are equal round reactive to light and accommodation extraocular muscles are intact.  Sclera and conjunctiva are unremarkable  Ears:  Tympanic membranes are pearly gray bilaterally; external auditory canals are unremarkable; external ears without any lesions  Nose:  Erythematous and edematous turbinates with a clear rhinorrhea   Mouth:  Mucosa is pink and moist without lesions.  Posterior pharynx is pink and moist without hypertrophy/exudate.  Clear postnasal drip  Neck:  Soft and supple without palpable lymphadenopathy.  Heart:  Regular  rate and rhythm without audible murmur  Lungs:  Clear to auscultation bilaterally without any wheezing/rales/rhonchi  Abdomen:  Soft nontender without any rebound or guarding; positive bowel sounds throughout  Genitalia:  Deferred  Skin:  Warm and dry without lesions.  Normal skin turgor.  Brisk capillary refill distally  Extremities:  Good strength bilaterally with full range of motion of upper and lower extremities.  No leg or calf tenderness.  No edema  Neuro:  Alert oriented x3.  Cranial nerves II-XII grossly intact as tested.  Excellent sensation distally over all dermatomes.  Psychiatric:  Patient cooperative, affect appropriate, insight and judgment good          Procedures           Patient Data     Labs Ordered/Reviewed   RAPID THROAT SCREEN, STREPTOCOCCUS, WITH REFLEX - Normal    Narrative:     Walk-Away Mode   COVID-19, FLU A/B, RSV RAPID BY PCR - Normal    Narrative:     Results are for the simultaneous qualitative identification of SARS-CoV-2 (formerly 2019-nCoV), Influenza A, Influenza B, and RSV RNA. These etiologic agents are generally detectable in nasopharyngeal and nasal swabs during the ACUTE PHASE of infection. Hence, this test is intended to be  performed on respiratory specimens collected from individuals with signs and symptoms of upper respiratory tract infection who meet Centers for Disease Control and Prevention (CDC) clinical and/or epidemiological criteria for Coronavirus Disease 2019 (COVID-19) testing. CDC COVID-19 criteria for testing on human specimens is available at [facility] webpage information for Healthcare Professionals: Coronavirus Disease 2019 (COVID-19) ([employer]).     False-negative results may occur if the virus has genomic mutations, insertions, deletions, or rearrangements or if performed very early in the course of illness. Otherwise, negative results indicate virus specific RNA targets are not detected, however negative  results do not preclude SARS-CoV-2 infection/COVID-19, Influenza, or Respiratory syncytial virus infection. Results should not be used as the sole basis for patient management decisions. Negative results must be combined with clinical observations, patient history, and epidemiological information. If upper respiratory tract infection is still suspected based on exposure history together with other clinical findings, re-testing should be considered.    Test methodology:   Cepheid Xpert Xpress SARS-CoV-2/Flu/RSV Assay real-time polymerase chain reaction (RT-PCR) test on the GeneXpert Dx and Xpert Xpress systems.   THROAT CULTURE, BETA HEMOLYTIC STREPTOCOCCUS       XR CHEST PA AND LATERAL   Final Result by Edi, Radresults In (03/18 2221)   NO ACTIVE INFILTRATE         Radiologist location ID: [id number]             Medical Decision Making          Medical Decision Making  COVID/flu/RSV/strep were negative.  Two-view chest shows no acute infiltrate, no acute disease process.  Patient findings are consistent with a upper respiratory illness/bronchitis.  Patient given Depo-Medrol 40 mg IM.  Is advised to stop vaping.  See discharge instructions for detailed    Amount and/or Complexity of Data Reviewed  Labs: ordered. Decision-making details documented in ED Course.  Radiology: ordered and independent interpretation performed. Decision-making details documented in ED Course.    Risk  Prescription drug management.    Critical Care  Total time providing critical care: 0 minutes        ED Course as of 07/31/23 2229   Tue Jul 31, 2023   2029 COVID/flu/RSV/strep were all negative   2223 Two-view chest shows no acute infiltrate, no acute disease process         Medications Ordered/Administered in the ED   methylPREDNISolone acetate (DEPO-medrol) 80 mg/mL injection (has no administration in time range)       Following the history, physical exam, and ED workup, the patient was deemed stable and suitable for discharge. The  patient/caregiver was advised to return to the ED for any new or worsening symptoms. Discharge medications, and follow-up instructions were discussed with the patient/caregiver in detail, who verbalizes understanding. The patient/caregiver is in agreement and is comfortable with the plan of care.    Disposition: Discharged         Current Discharge Medication List        CONTINUE these medications - NO CHANGES were made during your visit.        Details   Abilify Maintena 400 mg suspension,extended rel syring syringe  Generic drug: ARIPiprazole   400 mg, IntraMUSCULAR, EVERY 30 DAYS  Refills: 0     ipratropium bromide 42 mcg (0.06 %) Spray, Non-Aerosol  Commonly known as: ATROVENT   2 Sprays, 3 TIMES DAILY  Refills: 0     meclizine 25 mg Tablet  Commonly known as: ANTIVERT   1 Tablet, Oral, 3 TIMES  DAILY PRN  Refills: 0     metFORMIN 500 mg Tablet Sustained Release 24 hr  Commonly known as: GLUCOPHAGE XR   1 Tablet, 3 TIMES DAILY WITH MEALS  Refills: 0            Follow up:   Yancey Flemings, FNP-BC  [address]  [place] [id number]  [phone]      Follow up with the primary care provider for recheck in 2-3 days                 Clinical Impression   Bronchitis (Primary)   Viral upper respiratory illness         Current Discharge Medication List          R.A. Santiago Bumpers, DO  Department of Emergency Medicine

## 2023-07-31 NOTE — ED Nurses Note (Signed)
 A full assessment was not completed by this nurse. The patient was seen and discharged from the waiting area. The discharge instructions were given to the patient and the patient ambulated out of the ed

## 2023-07-31 NOTE — Discharge Instructions (Addendum)
 Please do not vape  Follow up with the primary care provider for recheck in 2-3 days  Return to emergency room for any worsening of symptoms, chest pain, fever, or any concerns  Over-the-counter Tylenol/Motrin as needed for any fever or body aches

## 2023-07-31 NOTE — ED APP Handoff Note (Signed)
 [facility] - Emergency Department  Emergency Department  Provider in Triage Note    Name: Rebecca Sparks  Age: 43 y.o.  Gender: female     Subjective:   Rebecca Sparks is a 43 y.o. female who presents with complaint of Flu Like Symptoms and Sore Throat  .  Patient here with c/o flu like symptoms since Sunday     Objective:   Filed Vitals:    07/31/23 1824   BP: (!) 139/91   Pulse: (!) 110   Resp: 18   Temp: 36.2 C (97.2 F)   SpO2: 97%        Assessment:  A medical screening exam was completed.  This patient is a 43 y.o. female with initial findings showing flu like symptoms    Plan:  Please see initial orders and work-up below.  This is to be continued with full evaluation in the main Emergency Department.     No current facility-administered medications for this encounter.     No results found for this or any previous visit (from the past 24 hours).     Sherlie Ban, FNP, ENP-C  07/31/2023, 18:24

## 2023-08-02 LAB — THROAT CULTURE, BETA HEMOLYTIC STREPTOCOCCUS: THROAT CULTURE: NORMAL

## 2023-10-14 ENCOUNTER — Emergency Department (HOSPITAL_COMMUNITY)

## 2023-10-14 ENCOUNTER — Ambulatory Visit (HOSPITAL_COMMUNITY)

## 2023-10-14 ENCOUNTER — Other Ambulatory Visit: Payer: Self-pay

## 2023-10-14 ENCOUNTER — Emergency Department
Admission: EM | Admit: 2023-10-14 | Discharge: 2023-10-15 | Disposition: A | Discharge status: Home / Self Care | Source: Home / Self Care | Attending: Emergency Medicine | Admitting: Emergency Medicine

## 2023-10-14 DIAGNOSIS — R0781 Pleurodynia: Secondary | ICD-10-CM | POA: Insufficient documentation

## 2023-10-14 DIAGNOSIS — R079 Chest pain, unspecified: Secondary | ICD-10-CM

## 2023-10-14 DIAGNOSIS — R0602 Shortness of breath: Secondary | ICD-10-CM

## 2023-10-14 LAB — CBC WITH DIFF
BASOPHIL #: 0.1 10*3/uL (ref 0.00–0.10)
BASOPHIL %: 1 % (ref 0–1)
EOSINOPHIL #: 0.1 10*3/uL (ref 0.00–0.50)
EOSINOPHIL %: 1 % (ref 1–7)
HCT: 43.4 % — ABNORMAL HIGH (ref 31.2–41.9)
HGB: 15 g/dL — ABNORMAL HIGH (ref 10.9–14.3)
LYMPHOCYTE #: 3.2 10*3/uL — ABNORMAL HIGH (ref 1.10–3.10)
LYMPHOCYTE %: 30 % (ref 16–46)
MCH: 30.9 pg (ref 24.7–32.8)
MCHC: 34.6 g/dL (ref 32.3–35.6)
MCV: 89.3 fL (ref 75.5–95.3)
MONOCYTE #: 0.7 10*3/uL (ref 0.20–0.90)
MONOCYTE %: 6 % (ref 4–11)
MPV: 8.4 fL (ref 7.9–10.8)
NEUTROPHIL #: 6.7 10*3/uL (ref 1.90–8.20)
NEUTROPHIL %: 63 % (ref 43–77)
PLATELETS: 267 10*3/uL (ref 140–440)
RBC: 4.86 10*6/uL (ref 3.63–4.92)
RDW: 15.2 % (ref 12.3–17.7)
WBC: 10.6 10*3/uL (ref 3.8–11.8)

## 2023-10-14 LAB — COMPREHENSIVE METABOLIC PANEL, NON-FASTING
ALBUMIN/GLOBULIN RATIO: 1.9 — ABNORMAL HIGH (ref 0.8–1.4)
ALBUMIN: 4.6 g/dL (ref 3.5–5.7)
ALKALINE PHOSPHATASE: 68 U/L (ref 34–104)
ALT (SGPT): 16 U/L (ref 7–52)
ANION GAP: 8 mmol/L (ref 4–13)
AST (SGOT): 18 U/L (ref 13–39)
BILIRUBIN TOTAL: 0.3 mg/dL (ref 0.3–1.0)
BUN/CREA RATIO: 4 — ABNORMAL LOW (ref 6–22)
BUN: 4 mg/dL — ABNORMAL LOW (ref 7–25)
CALCIUM, CORRECTED: 8.8 mg/dL — ABNORMAL LOW (ref 8.9–10.8)
CALCIUM: 9.3 mg/dL (ref 8.6–10.3)
CHLORIDE: 107 mmol/L (ref 98–107)
CO2 TOTAL: 27 mmol/L (ref 21–31)
CREATININE: 0.9 mg/dL (ref 0.60–1.30)
ESTIMATED GFR: 81 mL/min/{1.73_m2} (ref >59)
GLOBULIN: 2.4 (ref 2.0–3.5)
GLUCOSE: 84 mg/dL (ref 74–109)
OSMOLALITY, CALCULATED: 279 mosm/kg (ref 270–290)
POTASSIUM: 4.1 mmol/L (ref 3.5–5.1)
PROTEIN TOTAL: 7 g/dL (ref 6.4–8.9)
SODIUM: 142 mmol/L (ref 136–145)

## 2023-10-14 LAB — TROPONIN-I
TROPONIN I: 3 ng/L (ref <15)
TROPONIN I: 3 ng/L (ref <15)
TROPONIN I: 3 ng/L (ref <15)

## 2023-10-14 LAB — PT/INR
INR: 0.99 (ref 0.84–1.10)
PROTHROMBIN TIME: 11.2 s (ref 9.8–12.7)

## 2023-10-14 LAB — PTT (PARTIAL THROMBOPLASTIN TIME): APTT: 30.4 s (ref 25.0–38.0)

## 2023-10-14 MED ORDER — IOHEXOL 350 MG IODINE/ML INTRAVENOUS SOLUTION
100.0000 mL | INTRAVENOUS | Status: AC
Start: 2023-10-14 — End: 2023-10-14
  Administered 2023-10-14: 100 mL via INTRAVENOUS

## 2023-10-14 MED ORDER — ASPIRIN 81 MG CHEWABLE TABLET
324.0000 mg | CHEWABLE_TABLET | ORAL | Status: AC
Start: 2023-10-14 — End: 2023-10-14
  Administered 2023-10-14: 324 mg via ORAL

## 2023-10-14 MED ORDER — ASPIRIN 81 MG CHEWABLE TABLET
CHEWABLE_TABLET | ORAL | Status: AC
Start: 2023-10-14 — End: 2023-10-14
  Filled 2023-10-14: qty 4

## 2023-10-14 NOTE — ED Triage Notes (Addendum)
 Pt was vaping and chest pain started apx 3 days. Pt stopped smoking vape because pain was worse with vaping. Pt started to smoke black and milds instead. Pain became unbearable. Pain increases with breathing, nothing eases. Pt recently had FLU, RSV, and strep.

## 2023-10-14 NOTE — ED Provider Notes (Signed)
 CHIEF COMPLAINT  Chief Complaint   Patient presents with    Chest Pain      HISTORY OF PRESENT ILLNESS  Rebecca Sparks, date of birth 05-25-1980, is a 43 y.o. female who presented to the Emergency Department complain of a pleuritic pain in the right posterior chest cavity.  In his scapula area.  Every inspiration she feels a sharp pain that worsens, it has been steady since it started 2 or 3 days ago.  No fever chills no cough no shortness a breath.    PAST MEDICAL/SURGICAL/FAMILY/SOCIAL HISTORY  Past Medical History:   Diagnosis Date    Bipolar disorder, unspecified     Chronic obstructive airway disease     Schizophrenia        Past Surgical History:   Procedure Laterality Date    COLON SURGERY      pt states they removed approximatley 33 inches of instestines    ENDOMETRIAL ABLATION      HX TUBAL LIGATION      NECK SURGERY         Family Medical History:       Problem Relation (Age of Onset)    Breast Cancer Maternal Aunt          Social History     Socioeconomic History    Marital status: Single   Tobacco Use    Smoking status: Never    Smokeless tobacco: Never   Vaping Use    Vaping status: Every Day   Substance and Sexual Activity    Alcohol use: Never    Drug use: Yes     Frequency: 2.0 times per week     Types: Marijuana     Comment: last use- last night at 1900      ALLERGIES  No Known Allergies    PHYSICAL EXAM  VITAL SIGNS:  Filed Vitals:    10/14/23 1905   BP: (!) 147/88   Pulse: 77   Resp: 16   Temp: 36 C (96.8 F)   SpO2: 99%     GENERAL: PATIENT IS ALERT AND ORIENTED TO PERSON, PLACE, AND TIME.  IN NO DISTRESS  HEAD: NORMOCEPHALIC AND ATRAUMATIC.  EYES: PUPILS EQUALLY ROUND AND REACT TO LIGHT. EXTRAOCULAR MOVEMENTS INTACT.  EARS: GROSS HEARING INTACT. EXTERNAL EARS WITHIN NORMAL LIMITS.  THROAT: MOIST ORAL MUCOSA. NO ERYTHEMA OR EXUDATE OF THE PHARYNX.  NECK: SUPPLE. TRACHEA MIDLINE.  NO LYMPHADENOPATHY  CARDIOVASCULAR: REGULAR, RATE, AND RHYTHM. NO MURMUR.  LUNGS: CLEAR TO AUSCULTATION  BILATERAL.    EXTREMITIES: NO CYANOSIS, CLUBBING, OR EDEMA.  NO GROSS DEFORMITIES, MOVES ALL 4 EXTREMITIES  SKIN: WARM AND DRY.  NEUROLOGIC: CRANIAL NERVES II THROUGH XII ARE GROSSLY INTACT ALTHOUGH NOT INDIVIDUALLY TESTED.  No gross motor deficits  PSYCHIATRIC: JUDGMENT AND INSIGHT ARE SEEMINGLY INTACT. MOOD AND AFFECT ARE APPROPRIATE FOR THE SITUATION.    PROCEDURES    DIAGNOSTICS  Labs:  Labs listed below were reviewed and interpreted by me.  Results for orders placed or performed during the hospital encounter of 10/14/23   COMPREHENSIVE METABOLIC PANEL, NON-FASTING   Result Value Ref Range    SODIUM 142 136 - 145 mmol/L    POTASSIUM 4.1 3.5 - 5.1 mmol/L    CHLORIDE 107 98 - 107 mmol/L    CO2 TOTAL 27 21 - 31 mmol/L    ANION GAP 8 4 - 13 mmol/L    BUN 4 (L) 7 - 25 mg/dL    CREATININE 4.25 9.56 - 1.30 mg/dL  BUN/CREA RATIO 4 (L) 6 - 22    ESTIMATED GFR 81 >59 mL/min/1.25m^2    ALBUMIN 4.6 3.5 - 5.7 g/dL    CALCIUM 9.3 8.6 - 16.1 mg/dL    GLUCOSE 84 74 - 096 mg/dL    ALKALINE PHOSPHATASE 68 34 - 104 U/L    ALT (SGPT) 16 7 - 52 U/L    AST (SGOT) 18 13 - 39 U/L    BILIRUBIN TOTAL 0.3 0.3 - 1.0 mg/dL    PROTEIN TOTAL 7.0 6.4 - 8.9 g/dL    ALBUMIN/GLOBULIN RATIO 1.9 (H) 0.8 - 1.4    OSMOLALITY, CALCULATED 279 270 - 290 mOsm/kg    CALCIUM, CORRECTED 8.8 (L) 8.9 - 10.8 mg/dL    GLOBULIN 2.4 2.0 - 3.5   PT/INR   Result Value Ref Range    PROTHROMBIN TIME 11.2 9.8 - 12.7 seconds    INR 0.99 0.84 - 1.10   PTT (PARTIAL THROMBOPLASTIN TIME)   Result Value Ref Range    APTT 30.4 25.0 - 38.0 seconds   TROPONIN-I NOW   Result Value Ref Range    TROPONIN I 3 <15 ng/L   TROPONIN-I IN ONE HOUR   Result Value Ref Range    TROPONIN I 3 <15 ng/L   TROPONIN-I IN THREE HOURS   Result Value Ref Range    TROPONIN I 3 <15 ng/L   CBC WITH DIFF   Result Value Ref Range    WBC 10.6 3.8 - 11.8 x10^3/uL    RBC 4.86 3.63 - 4.92 x10^6/uL    HGB 15.0 (H) 10.9 - 14.3 g/dL    HCT 04.5 (H) 40.9 - 41.9 %    MCV 89.3 75.5 - 95.3 fL    MCH 30.9 24.7 -  32.8 pg    MCHC 34.6 32.3 - 35.6 g/dL    RDW 81.1 91.4 - 78.2 %    PLATELETS 267 140 - 440 x10^3/uL    MPV 8.4 7.9 - 10.8 fL    NEUTROPHIL % 63 43 - 77 %    LYMPHOCYTE % 30 16 - 46 %    MONOCYTE % 6 4 - 11 %    EOSINOPHIL % 1 1 - 7 %    BASOPHIL % 1 0 - 1 %    NEUTROPHIL # 6.70 1.90 - 8.20 x10^3/uL    LYMPHOCYTE # 3.20 (H) 1.10 - 3.10 x10^3/uL    MONOCYTE # 0.70 0.20 - 0.90 x10^3/uL    EOSINOPHIL # 0.10 0.00 - 0.50 x10^3/uL    BASOPHIL # 0.10 0.00 - 0.10 x10^3/uL     Radiology:  Results for orders placed or performed during the hospital encounter of 10/14/23   XR CHEST PA AND LATERAL     Status: None    Narrative    Kajal D Gronewold    RADIOLOGIST: Justin A Torok    XR CHEST PA AND LATERAL performed on 10/14/2023 7:22 PM    CLINICAL HISTORY: Chest Pain  chest pain.    TECHNIQUE: Frontal and lateral views of the chest.    COMPARISON:  March 2025    FINDINGS:    The heart size is normal.  No interstitial edema  The lungs are clear.   No pleural effusion or pneumothorax  No acute osseous findings      Impression    No acute disease      Radiologist location ID: [id number]     CT ANGIO CHEST FOR PULMONARY EMBOLUS W IV CONTRAST  Status: None    Narrative    Ashantee D Morson    RADIOLOGIST: Nicholas A Naro    CT ANGIO CHEST FOR PULMONARY EMBOLUS W IV CONTRAST performed on 10/14/2023 10:28 PM    CLINICAL HISTORY: Chest pain, shortness for breath  Chest pain, shortness for breath, smoker 20+ yrs    TECHNIQUE: CTA imaging of the chest with intravenous contrast.  3D reconstructions.  CONTRAST: Omnipaque  350    COMPARISON: Chest radiograph performed earlier today         FINDINGS:  Pulmonary Vessels:  No evidence of acute pulmonary emboli through the major subsegmental branches.    Hardware:  None.    Lymph nodes:   No mediastinal, hilar, or axillary lymphadenopathy.    Heart:  Normal heart size.  No pericardial effusion.     Thoracic Aorta:  No thoracic aortic aneurysm or dissection.    Lungs and Airways:  The lungs are  normally expanded and clear.    Pleura: No pleural effusion.  No pneumothorax.    Upper Abdomen: No acute findings in the upper abdomen.    Bones: No acute osseous abnormalities are identified in the imaged bones.        Impression    Negative evaluation for acute pulmonary embolism.    No acute findings in the chest.        Radiologist location ID: [facility]         ED COURSE/MEDICAL DECISION MAKING  Medications Ordered/Administered in the ED   aspirin chewable tablet 324 mg (324 mg Oral Given 10/14/23 1910)   iohexol  (OMNIPAQUE  350) infusion (100 mL Intravenous Given 10/14/23 2229)          Medical Decision Making  Differential certainly could include pulmonary embolism, pneumonia, pleurisy, intercostal muscle strain or sprain, pneumothorax, cardiac    Amount and/or Complexity of Data Reviewed  Labs:  Decision-making details documented in ED Course.  Radiology:  Decision-making details documented in ED Course.      CRITICAL CARE    CLINICAL IMPRESSION  Clinical Impression   Chest pain (Primary)     DISPOSITION  Discharged       DISCHARGE MEDICATIONS  Current Discharge Medication List          //Doug Johnell Na M.D.   10/14/2023, 23:50   [facility]  Department of Emergency Medicine  [place]  [place]    This note was partially generated using MModal Fluency Direct system, and there may be some incorrect words, spellings, and punctuation that were not noted in checking the note before saving.    -----

## 2023-10-15 NOTE — ED Notes (Signed)
 [facility] - Emergency Department  Peer Recovery Coach Assessment    Initial Evaluation  Referred by:: Nurse  Location of Evaluation: Emergency Department  How many times in the last 12 months have you been to the ED?: 3  Have you ever served or are you currently serving in the Armed Forces?: No             Substance Use History  Patient current substance use status: Missed pt on 6/1 @ 6:59pm.  Patient states on the audit c that she has used marijuana in the past 12 months.  Will attempt to make contact with this patient by phone.                                  Family, Social, Home & Safety History  Marital Status: Single                            Contact phone number for the patient: [phone]  Emergency contact name and phone number: DICKERSON,PATRICIA (Mother)  [phone]              Employment            Engagement  Readiness ruler: 1    Brief Intervention  Discussed plan to reduce/quit substance use?: No  Discussed willingness to enter treatment?: No  Indicated patient's stage of change:: 1 - Precontemplation    Patient seen by Peer Recovery Coach and is a candidate for buprenorphine  administration in the ED. Patient needs assessment for bup treatment.: No    Plan  Was the patient referred to treatment?: No    Was patient referred to physician for Buprenorphine  Assessment in the ED?: No    Did patient receive Narcan in the ED?: No         Follow-up           Need for additional follow-up?: Yes       Bess Broody, Peer Recovery Coach 10/15/2023 08:27

## 2023-10-15 NOTE — ED Nurses Note (Signed)
 Pt ambulated out of ED with discharge instructions and belongings

## 2023-11-26 ENCOUNTER — Emergency Department: Admission: EM | Admit: 2023-11-26 | Discharge: 2023-11-26 | Disposition: A | Discharge status: Home / Self Care

## 2023-11-26 ENCOUNTER — Other Ambulatory Visit: Payer: Self-pay

## 2023-11-26 DIAGNOSIS — Z8719 Personal history of other diseases of the digestive system: Secondary | ICD-10-CM | POA: Insufficient documentation

## 2023-11-26 DIAGNOSIS — N898 Other specified noninflammatory disorders of vagina: Secondary | ICD-10-CM | POA: Insufficient documentation

## 2023-11-26 DIAGNOSIS — N390 Urinary tract infection, site not specified: Secondary | ICD-10-CM | POA: Insufficient documentation

## 2023-11-26 DIAGNOSIS — L292 Pruritus vulvae: Secondary | ICD-10-CM | POA: Insufficient documentation

## 2023-11-26 LAB — URINALYSIS, MICROSCOPIC
RBCS: 16 /HPF — ABNORMAL HIGH (ref <4)
SQUAMOUS EPITHELIAL: 1 /HPF (ref <28)
TRANSITIONAL EPITHELIAL CELLS URINE: 1 /HPF (ref <6)
WBCS: 331 /HPF — ABNORMAL HIGH (ref <6)

## 2023-11-26 LAB — COMPREHENSIVE METABOLIC PANEL, NON-FASTING
ALBUMIN/GLOBULIN RATIO: 1.6 — ABNORMAL HIGH (ref 0.8–1.4)
ALBUMIN: 4.6 g/dL (ref 3.5–5.7)
ALKALINE PHOSPHATASE: 67 U/L (ref 34–104)
ALT (SGPT): 14 U/L (ref 7–52)
ANION GAP: 9 mmol/L (ref 4–13)
AST (SGOT): 13 U/L (ref 13–39)
BILIRUBIN TOTAL: 0.7 mg/dL (ref 0.3–1.0)
BUN/CREA RATIO: 8 (ref 6–22)
BUN: 7 mg/dL (ref 7–25)
CALCIUM, CORRECTED: 8.9 mg/dL (ref 8.9–10.8)
CALCIUM: 9.4 mg/dL (ref 8.6–10.3)
CHLORIDE: 106 mmol/L (ref 98–107)
CO2 TOTAL: 26 mmol/L (ref 21–31)
CREATININE: 0.93 mg/dL (ref 0.60–1.30)
ESTIMATED GFR: 78 mL/min/1.73mˆ2 (ref >59)
GLOBULIN: 2.9 (ref 2.0–3.5)
GLUCOSE: 122 mg/dL — ABNORMAL HIGH (ref 74–109)
OSMOLALITY, CALCULATED: 281 mosm/kg (ref 270–290)
POTASSIUM: 3.9 mmol/L (ref 3.5–5.1)
PROTEIN TOTAL: 7.5 g/dL (ref 6.4–8.9)
SODIUM: 141 mmol/L (ref 136–145)

## 2023-11-26 LAB — URINALYSIS, MACROSCOPIC
BILIRUBIN: NEGATIVE mg/dL
BLOOD: 0.5 mg/dL — AB
GLUCOSE: NEGATIVE mg/dL
KETONES: NEGATIVE mg/dL
LEUKOCYTES: 500 WBCs/uL — AB
PH: 7.5 (ref 5.0–9.0)
PROTEIN: 30 mg/dL — AB
SPECIFIC GRAVITY: 1.013 (ref 1.002–1.030)
UROBILINOGEN: NORMAL mg/dL

## 2023-11-26 LAB — CBC WITH DIFF
BASOPHIL #: 0 x10ˆ3/uL (ref 0.00–0.10)
BASOPHIL %: 0 % (ref 0–1)
EOSINOPHIL #: 0 x10ˆ3/uL (ref 0.00–0.50)
EOSINOPHIL %: 0 % — ABNORMAL LOW (ref 1–7)
HCT: 43.2 % — ABNORMAL HIGH (ref 31.2–41.9)
HGB: 14.5 g/dL — ABNORMAL HIGH (ref 10.9–14.3)
LYMPHOCYTE #: 1.6 x10ˆ3/uL (ref 1.10–3.10)
LYMPHOCYTE %: 14 % — ABNORMAL LOW (ref 16–46)
MCH: 30 pg (ref 24.7–32.8)
MCHC: 33.6 g/dL (ref 32.3–35.6)
MCV: 89.1 fL (ref 75.5–95.3)
MONOCYTE #: 0.7 x10ˆ3/uL (ref 0.20–0.90)
MONOCYTE %: 6 % (ref 4–11)
MPV: 8.1 fL (ref 7.9–10.8)
NEUTROPHIL #: 9.1 x10ˆ3/uL — ABNORMAL HIGH (ref 1.90–8.20)
NEUTROPHIL %: 79 % — ABNORMAL HIGH (ref 43–77)
PLATELETS: 280 x10ˆ3/uL (ref 140–440)
RBC: 4.85 x10ˆ6/uL (ref 3.63–4.92)
RDW: 15.1 % (ref 12.3–17.7)
WBC: 11.4 x10ˆ3/uL (ref 3.8–11.8)

## 2023-11-26 LAB — LACTIC ACID LEVEL W/ REFLEX FOR LEVEL >2.0: LACTIC ACID: 2 mmol/L (ref 0.5–2.2)

## 2023-11-26 LAB — LIPASE: LIPASE: 18 U/L (ref 11–82)

## 2023-11-26 LAB — CHLAMYDIA / NEISSERIA DNA BY PCR
CHLAMYDIA TRACHOMATIS PCR: NOT DETECTED
NEISSERIA GONORRHOEAE PCR: NOT DETECTED

## 2023-11-26 MED ORDER — FLUCONAZOLE 150 MG TABLET
150.0000 mg | ORAL_TABLET | Freq: Once | ORAL | 0 refills | Status: AC
Start: 2023-11-26 — End: 2023-11-26

## 2023-11-26 MED ORDER — KETOROLAC 60 MG/2 ML INTRAMUSCULAR SOLUTION
60.0000 mg | INTRAMUSCULAR | Status: AC
Start: 2023-11-26 — End: 2023-11-26
  Administered 2023-11-26: 60 mg via INTRAMUSCULAR

## 2023-11-26 MED ORDER — KETOROLAC 60 MG/2 ML INTRAMUSCULAR SOLUTION
INTRAMUSCULAR | Status: AC
Start: 2023-11-26 — End: 2023-11-26
  Filled 2023-11-26: qty 2

## 2023-11-26 MED ORDER — CEFDINIR 300 MG CAPSULE
300.0000 mg | ORAL_CAPSULE | Freq: Two times a day (BID) | ORAL | 0 refills | Status: AC
Start: 2023-11-26 — End: 2023-12-06

## 2023-11-26 MED ORDER — ONDANSETRON 4 MG DISINTEGRATING TABLET: 4.0000 mg | ORAL_TABLET | Freq: Three times a day (TID) | ORAL | 0 refills | Status: DC | PRN

## 2023-11-26 MED ORDER — CEFTRIAXONE 1 GRAM SOLUTION FOR INJECTION
INTRAMUSCULAR | Status: AC
Start: 2023-11-26 — End: 2023-11-26
  Filled 2023-11-26: qty 10

## 2023-11-26 MED ORDER — LIDOCAINE HCL 10 MG/ML (1 %) INJECTION SOLUTION
1.0000 g | INTRAMUSCULAR | Status: AC
Start: 2023-11-26 — End: 2023-11-26
  Administered 2023-11-26: 1 g via INTRAMUSCULAR

## 2023-11-26 NOTE — ED Provider Notes (Signed)
 [facility]  ED Primary Provider Note      Name: Rebecca Sparks  Age and Gender: 43 y.o. female  Date of Birth: [birth year]  MRN: [id number]  PCP: Elouise Stabs, FNP-BC    CC:  Chief Complaint   Patient presents with    Flank Pain       HPI:  Rebecca Sparks is a 43 y.o. Black or [place] American female who presents to the ER with flank pain.  Patient reports right flank pain over the last couple of days.  She states that it radiates around the abdomen.  She reports her last bowel movement was yesterday.  She states that she has a history of diverticulitis, however she thinks that you this is associated with her urinary tract.  Patient may also be concerned that she has an STD.  She reports increased vaginal itching with discharge.  Denies any nausea vomiting fever chills    Below pertinent information reviewed with patient:  Past Medical History:   Diagnosis Date    Bipolar disorder, unspecified     Chronic obstructive airway disease     Schizophrenia            Allergies[1]    Past Surgical History:   Procedure Laterality Date    COLON SURGERY      pt states they removed approximatley 33 inches of instestines    ENDOMETRIAL ABLATION      HX TUBAL LIGATION      LAPAROSCOPIC RIGHT SALPINGO-OOPHORECTOMY N/A 02/20/2023    Performed by Adeline Penne HERO, DO at [facility]    NECK SURGERY          Social History     Socioeconomic History    Marital status: Single   Tobacco Use    Smoking status: Never    Smokeless tobacco: Never   Vaping Use    Vaping status: Every Day   Substance and Sexual Activity    Alcohol use: Never    Drug use: Yes     Frequency: 2.0 times per week     Types: Marijuana     Comment: last use- last night at 1900       ROS:  No other overt positive review of systems are noted other than stated in the HPI.      Objective:    ED Triage Vitals [11/26/23 1445]   BP (Non-Invasive) 123/67   Heart Rate 91   Respiratory Rate 20   Temperature 36.8 C (98.2 F)   SpO2 97 %    Weight 113 kg (250 lb)   Height 1.768 m (5' 9.6)     Filed Vitals:    11/26/23 1445   BP: 123/67   Pulse: 91   Resp: 20   Temp: 36.8 C (98.2 F)   SpO2: 97%       Nursing notes and vital signs reviewed.    Constitutional - No acute distress.  Alert and Active.  HEENT - Normocephalic. Atraumatic. PERRL. EOMI. Conjunctiva clear. TM's pearly grey, translucent, without bulging or retraction. Oropharynx with no erythema, lesions, or exudates. Moist mucous membranes.   Neck - Trachea midline. No stridor. No hoarseness.  Cardiac - Regular rate and rhythm. No murmurs, rubs, or gallops. Intact distal pulses.  Respiratory/Chest - Normal respiratory effort. Clear to auscultation bilaterally. No rales, wheezes or rhonchi. No chest tenderness.  Abdomen - Normal bowel sounds. Non-tender, soft, non-distended. No rebound or guarding.   Musculoskeletal - Good  AROM. No muscle or joint tenderness appreciated. No clubbing, cyanosis or edema.  Skin - Warm and dry, without any rashes or other lesions.  Neuro - Alert and oriented x 3. Cranial nerves II-XII are grossly intact.  Moving all extremities symmetrically. Normal gait.  Psych - Normal mood and affect. Behavior is normal        Any pertinent labs and imaging obtained during this encounter reviewed below in MDM.    MDM/ED Course:    Medical Decision Making  Patient presents to the ER with flank pain.  Patient reports right flank pain over the last couple of days.  She states that it radiates around the abdomen.  She reports her last bowel movement was yesterday.  She states that she has a history of diverticulitis, however she thinks that you this is associated with her urinary tract.  Patient may also be concerned that she has an STD.  She reports increased vaginal itching with discharge.  Denies any nausea vomiting fever chills      Differential diagnoses include:  Urinary tract infection, pyelonephritis, nephrolithiasis, ureterolithiasis    Patient underwent diagnostics with  results as noted in ED course.    Patient was found/suspected to have urinary tract infection.  STD testing was negative.  CBC showed a mildly elevated white blood cell count of 11.4, CMP was unremarkable.  Urinalysis was indicative of urinary tract infection.  Lactic acid was within normal limits along with lipase.  Patient was offered CT scan, however she states that she would really like to go home and follow up if needed.  Patient was given Toradol  for the discomfort and Rocephin  1 g intramuscularly.  Patient will be discharged with cefdinir  twice daily.  Encouraged close follow up with primary care doctor to ensure improvement of symptoms.  Patient agreeable to treatment course.  All questions answered.  Strict ER return precautions given    Patient will be discharged in stable condition at this time.    Amount and/or Complexity of Data Reviewed  Labs: ordered.  Radiology: ordered.  ECG/medicine tests: independent interpretation performed.    Risk  Prescription drug management.           ED Course as of 11/26/23 2000   Mon Nov 26, 2023   1835 CHLAMYDIA TRACHOMATIS PCR: Not Detected   1836 NEISSERIA GONORRHOEAE PCR: Not Detected   1904 CBC/DIFF(!)  WBCs 11.4, hemoglobin 14.5, hematocrit 43.2, platelet 282   1905 COMPREHENSIVE METABOLIC PANEL, NON-FASTING(!)  Na 141, K3.9, BUN 7, creatinine 0.93   1905 URINALYSIS, MACROSCOPIC AND MICROSCOPIC W/CULTURE REFLEX(!)  Indicative of urinary tract infection   1905 LACTIC ACID: 2.0   1905 LIPASE: 18       Orders Placed This Encounter    URINE CULTURE,ROUTINE    CANCELED: CT RENAL CALCULI SERIES WO IV CONTRAST    CT ABDOMEN PELVIS WO IV CONTRAST    CBC/DIFF    COMPREHENSIVE METABOLIC PANEL, NON-FASTING    LIPASE    URINALYSIS, MACROSCOPIC AND MICROSCOPIC W/CULTURE REFLEX    LACTIC ACID LEVEL W/ REFLEX FOR LEVEL >2.0    CBC WITH DIFF    URINALYSIS, MACROSCOPIC    URINALYSIS, MICROSCOPIC    CHLAMYDIA / NEISSERIA DNA BY PCR    EXTRA TUBES    BLUE TOP TUBE    GOLD TOP TUBE     ketorolac  (TORADOL ) 60mg /2 mL IM injection    cefTRIAXone  (ROCEPHIN ) 1 g in lidocaine  2.86 mL (tot vol) IM injection    cefdinir  (  OMNICEF ) 300 mg Oral Capsule    ondansetron  (ZOFRAN  ODT) 4 mg Oral Tablet, Rapid Dissolve    fluconazole  (DIFLUCAN ) 150 mg Oral Tablet         Impression:   Clinical Impression   Urinary tract infection (Primary)       Disposition: Discharged    / M. Sueanne Benders, APRN, FNP-C 11/26/2023, 19:12  [facility]  Department of Emergency Medicine  [place]  [place]    Portions of this note may have been dictated using voice recognition software.     -----------------------  Results for orders placed or performed during the hospital encounter of 11/26/23 (from the past 12 hours)   COMPREHENSIVE METABOLIC PANEL, NON-FASTING   Result Value Ref Range    SODIUM 141 136 - 145 mmol/L    POTASSIUM 3.9 3.5 - 5.1 mmol/L    CHLORIDE 106 98 - 107 mmol/L    CO2 TOTAL 26 21 - 31 mmol/L    ANION GAP 9 4 - 13 mmol/L    BUN 7 7 - 25 mg/dL    CREATININE 9.06 9.39 - 1.30 mg/dL    BUN/CREA RATIO 8 6 - 22    ESTIMATED GFR 78 >59 mL/min/1.32m^2    ALBUMIN 4.6 3.5 - 5.7 g/dL    CALCIUM 9.4 8.6 - 89.6 mg/dL    GLUCOSE 877 (H) 74 - 109 mg/dL    ALKALINE PHOSPHATASE 67 34 - 104 U/L    ALT (SGPT) 14 7 - 52 U/L    AST (SGOT) 13 13 - 39 U/L    BILIRUBIN TOTAL 0.7 0.3 - 1.0 mg/dL    PROTEIN TOTAL 7.5 6.4 - 8.9 g/dL    ALBUMIN/GLOBULIN RATIO 1.6 (H) 0.8 - 1.4    OSMOLALITY, CALCULATED 281 270 - 290 mOsm/kg    CALCIUM, CORRECTED 8.9 8.9 - 10.8 mg/dL    GLOBULIN 2.9 2.0 - 3.5   LIPASE   Result Value Ref Range    LIPASE 18 11 - 82 U/L   LACTIC ACID LEVEL W/ REFLEX FOR LEVEL >2.0   Result Value Ref Range    LACTIC ACID 2.0 0.5 - 2.2 mmol/L   CBC WITH DIFF   Result Value Ref Range    WBC 11.4 3.8 - 11.8 x10^3/uL    RBC 4.85 3.63 - 4.92 x10^6/uL    HGB 14.5 (H) 10.9 - 14.3 g/dL    HCT 56.7 (H) 68.7 - 41.9 %    MCV 89.1 75.5 - 95.3 fL    MCH 30.0 24.7 - 32.8 pg    MCHC 33.6 32.3 - 35.6 g/dL    RDW 84.8 87.6 -  82.2 %    PLATELETS 280 140 - 440 x10^3/uL    MPV 8.1 7.9 - 10.8 fL    NEUTROPHIL % 79 (H) 43 - 77 %    LYMPHOCYTE % 14 (L) 16 - 46 %    MONOCYTE % 6 4 - 11 %    EOSINOPHIL % 0 (L) 1 - 7 %    BASOPHIL % 0 0 - 1 %    NEUTROPHIL # 9.10 (H) 1.90 - 8.20 x10^3/uL    LYMPHOCYTE # 1.60 1.10 - 3.10 x10^3/uL    MONOCYTE # 0.70 0.20 - 0.90 x10^3/uL    EOSINOPHIL # 0.00 0.00 - 0.50 x10^3/uL    BASOPHIL # 0.00 0.00 - 0.10 x10^3/uL   URINALYSIS, MACROSCOPIC   Result Value Ref Range    COLOR Yellow Colorless, Light Yellow, Yellow    APPEARANCE  Turbid (A) Clear    SPECIFIC GRAVITY 1.013 1.002 - 1.030    PH 7.5 5.0 - 9.0    LEUKOCYTES 500 (A) Negative, 100  WBCs/uL    NITRITE 2+ (A) Negative    PROTEIN 30 (A) Negative, 10 , 20  mg/dL    GLUCOSE Negative Negative, 30  mg/dL    KETONES Negative Negative, Trace mg/dL    BILIRUBIN Negative Negative, 0.5 mg/dL    BLOOD 0.5 (A) Negative, 0.03 mg/dL    UROBILINOGEN Normal Normal mg/dL   URINALYSIS, MICROSCOPIC   Result Value Ref Range    MUCOUS Rare Rare, Occasional, Few /hpf    AMORPHOUS SEDIMENT Rare (A) (none) /hpf    RBCS 16 (H) <4 /hpf    WBCS 331 (H) <6 /hpf    WHITE BLOOD CELL CLUMP Many (A) (none) /hpf    SQUAMOUS EPITHELIAL 1 <28 /hpf    TRANSITIONAL EPITHELIAL CELLS URINE 1 <6 /hpf   CHLAMYDIA / NEISSERIA DNA BY PCR   Result Value Ref Range    CHLAMYDIA TRACHOMATIS PCR Not Detected Not Detected    NEISSERIA GONORRHOEAE PCR Not Detected Not Detected     No orders to display            [1] No Known Allergies

## 2023-11-26 NOTE — ED APP Handoff Note (Signed)
 [facility] - Emergency Department  Emergency Department  Provider in Triage Note    Name: Rebecca Sparks  Age: 43 y.o.  Gender: female     Subjective:   Rebecca Sparks is a 43 y.o. female who presents with complaint of Flank Pain  .  Patient reports right flank pain over the couple of days.  Let reports last bowel movement was yesterday.  History of abdominal surgeries.  Also concerned she may have an STD    Objective:   Filed Vitals:    11/26/23 1445   BP: 123/67   Pulse: 91   Resp: 20   Temp: 36.8 C (98.2 F)   SpO2: 97%      Focused Physical Exam shows patient in no acute distress    Assessment:  A medical screening exam was completed.  This patient is a 43 y.o. female with initial findings showing flank pain    Plan:  Please see initial orders and work-up below.  This is to be continued with full evaluation in the main Emergency Department.     No current facility-administered medications for this encounter.     Results for orders placed or performed during the hospital encounter of 11/26/23 (from the past 24 hours)   URINALYSIS, MACROSCOPIC AND MICROSCOPIC W/CULTURE REFLEX    Collection Time: 11/26/23  2:44 PM    Specimen: Urine, Site not specified    Narrative    The following orders were created for panel order URINALYSIS, MACROSCOPIC AND MICROSCOPIC W/CULTURE REFLEX.  Procedure                               Abnormality         Status                     ---------                               -----------         ------                     URINALYSIS, MACROSCOPIC[[id number]]                                                     URINALYSIS, MICROSCOPIC[[id number]]                                                       Please view results for these tests on the individual orders.   COMPREHENSIVE METABOLIC PANEL, NON-FASTING    Collection Time: 11/26/23  2:57 PM   Result Value Ref Range    SODIUM 141 136 - 145 mmol/L    POTASSIUM 3.9 3.5 - 5.1 mmol/L    CHLORIDE 106 98 - 107 mmol/L    CO2  TOTAL 26 21 - 31 mmol/L    ANION GAP 9 4 - 13 mmol/L    BUN 7 7 - 25 mg/dL    CREATININE 9.06 9.39 - 1.30 mg/dL    BUN/CREA RATIO 8 6 -  22    ESTIMATED GFR 78 >59 mL/min/1.15m^2    ALBUMIN 4.6 3.5 - 5.7 g/dL    CALCIUM 9.4 8.6 - 89.6 mg/dL    GLUCOSE 877 (H) 74 - 109 mg/dL    ALKALINE PHOSPHATASE 67 34 - 104 U/L    ALT (SGPT) 14 7 - 52 U/L    AST (SGOT) 13 13 - 39 U/L    BILIRUBIN TOTAL 0.7 0.3 - 1.0 mg/dL    PROTEIN TOTAL 7.5 6.4 - 8.9 g/dL    ALBUMIN/GLOBULIN RATIO 1.6 (H) 0.8 - 1.4    OSMOLALITY, CALCULATED 281 270 - 290 mOsm/kg    CALCIUM, CORRECTED 8.9 8.9 - 10.8 mg/dL    GLOBULIN 2.9 2.0 - 3.5    Narrative    Estimated Glomerular Filtration Rate (eGFR) is calculated using the CKD-EPI (2021) equation, intended for patients 72 years of age and older. If gender is not documented or unknown, there will be no eGFR calculation.     LIPASE    Collection Time: 11/26/23  2:57 PM   Result Value Ref Range    LIPASE 18 11 - 82 U/L   CBC/DIFF    Collection Time: 11/26/23  2:58 PM    Narrative    The following orders were created for panel order CBC/DIFF.  Procedure                               Abnormality         Status                     ---------                               -----------         ------                     CBC WITH IPQQ[[id number]]                Abnormal            Final result                 Please view results for these tests on the individual orders.   LACTIC ACID LEVEL W/ REFLEX FOR LEVEL >2.0    Collection Time: 11/26/23  2:58 PM   Result Value Ref Range    LACTIC ACID 2.0 0.5 - 2.2 mmol/L   CBC WITH DIFF    Collection Time: 11/26/23  2:58 PM   Result Value Ref Range    WBC 11.4 3.8 - 11.8 x10^3/uL    RBC 4.85 3.63 - 4.92 x10^6/uL    HGB 14.5 (H) 10.9 - 14.3 g/dL    HCT 56.7 (H) 68.7 - 41.9 %    MCV 89.1 75.5 - 95.3 fL    MCH 30.0 24.7 - 32.8 pg    MCHC 33.6 32.3 - 35.6 g/dL    RDW 84.8 87.6 - 82.2 %    PLATELETS 280 140 - 440 x10^3/uL    MPV 8.1 7.9 - 10.8 fL    NEUTROPHIL % 79 (H) 43 - 77 %     LYMPHOCYTE % 14 (L) 16 - 46 %    MONOCYTE % 6 4 - 11 %    EOSINOPHIL % 0 (L) 1 - 7 %    BASOPHIL %  0 0 - 1 %    NEUTROPHIL # 9.10 (H) 1.90 - 8.20 x10^3/uL    LYMPHOCYTE # 1.60 1.10 - 3.10 x10^3/uL    MONOCYTE # 0.70 0.20 - 0.90 x10^3/uL    EOSINOPHIL # 0.00 0.00 - 0.50 x10^3/uL    BASOPHIL # 0.00 0.00 - 0.10 x10^3/uL   EXTRA TUBES    Collection Time: 11/26/23  3:06 PM    Narrative    The following orders were created for panel order EXTRA TUBES.  Procedure                               Abnormality         Status                     ---------                               -----------         ------                     BLUE TOP ULAZ[[id number]]                                    In process                 GOLD TOP ULAZ[[id number]]                                    In process                   Please view results for these tests on the individual orders.        [place] JINNY Benders, FNP-C  11/26/2023, 14:44

## 2023-11-26 NOTE — Discharge Instructions (Signed)
 Take cefdinir  as directed.  Please follow up with family doctor to ensure improvement of symptoms.  Return to the ER for any new or worsening symptoms

## 2023-11-26 NOTE — ED Triage Notes (Signed)
 Rt side pain  .  Hx diverticulitis . Also c/o vaginal  itching  states not sure if she has std

## 2023-11-28 ENCOUNTER — Ambulatory Visit (HOSPITAL_COMMUNITY): Payer: Self-pay

## 2023-11-28 LAB — URINE CULTURE,ROUTINE: URINE CULTURE: >100000 — AB

## 2024-02-04 ENCOUNTER — Emergency Department
Admission: EM | Admit: 2024-02-04 | Discharge: 2024-02-04 | Disposition: A | Discharge status: Home / Self Care | Attending: Emergency Medicine | Admitting: Emergency Medicine

## 2024-02-04 ENCOUNTER — Other Ambulatory Visit: Payer: Self-pay

## 2024-02-04 ENCOUNTER — Emergency Department (HOSPITAL_COMMUNITY)

## 2024-02-04 ENCOUNTER — Encounter (HOSPITAL_COMMUNITY): Payer: Self-pay | Admitting: Emergency Medicine

## 2024-02-04 DIAGNOSIS — Z79899 Other long term (current) drug therapy: Secondary | ICD-10-CM | POA: Insufficient documentation

## 2024-02-04 DIAGNOSIS — R0789 Other chest pain: Secondary | ICD-10-CM | POA: Insufficient documentation

## 2024-02-04 DIAGNOSIS — R9431 Abnormal electrocardiogram [ECG] [EKG]: Secondary | ICD-10-CM | POA: Insufficient documentation

## 2024-02-04 LAB — CBC WITH DIFF
BASOPHIL #: 0 x10ˆ3/uL (ref 0.00–0.10)
BASOPHIL %: 0 % (ref 0–1)
EOSINOPHIL #: 0.1 x10ˆ3/uL (ref 0.00–0.50)
EOSINOPHIL %: 1 % (ref 1–7)
HCT: 40.1 % (ref 31.2–41.9)
HGB: 13.7 g/dL (ref 10.9–14.3)
LYMPHOCYTE #: 3.6 x10ˆ3/uL — ABNORMAL HIGH (ref 1.10–3.10)
LYMPHOCYTE %: 31 % (ref 16–46)
MCH: 30.7 pg (ref 24.7–32.8)
MCHC: 34.1 g/dL (ref 32.3–35.6)
MCV: 89.9 fL (ref 75.5–95.3)
MONOCYTE #: 0.8 x10ˆ3/uL (ref 0.20–0.90)
MONOCYTE %: 7 % (ref 4–11)
MPV: 7.9 fL (ref 7.9–10.8)
NEUTROPHIL #: 7.1 x10ˆ3/uL (ref 1.90–8.20)
NEUTROPHIL %: 61 % (ref 43–77)
PLATELETS: 270 x10ˆ3/uL (ref 140–440)
RBC: 4.46 x10ˆ6/uL (ref 3.63–4.92)
RDW: 14.4 % (ref 12.3–17.7)
WBC: 11.6 x10ˆ3/uL (ref 3.8–11.8)

## 2024-02-04 LAB — COMPREHENSIVE METABOLIC PANEL, NON-FASTING
ALBUMIN/GLOBULIN RATIO: 1.7 — ABNORMAL HIGH (ref 0.8–1.4)
ALBUMIN: 4.5 g/dL (ref 3.5–5.7)
ALKALINE PHOSPHATASE: 60 U/L (ref 34–104)
ALT (SGPT): 12 U/L (ref 7–52)
ANION GAP: 9 mmol/L (ref 4–13)
AST (SGOT): 15 U/L (ref 13–39)
BILIRUBIN TOTAL: 0.5 mg/dL (ref 0.3–1.0)
BUN/CREA RATIO: 11 (ref 6–22)
BUN: 11 mg/dL (ref 7–25)
CALCIUM, CORRECTED: 8.9 mg/dL (ref 8.9–10.8)
CALCIUM: 9.3 mg/dL (ref 8.6–10.3)
CHLORIDE: 105 mmol/L (ref 98–107)
CO2 TOTAL: 26 mmol/L (ref 21–31)
CREATININE: 0.98 mg/dL (ref 0.60–1.30)
ESTIMATED GFR: 73 mL/min/1.73mˆ2 (ref >59)
GLOBULIN: 2.7 (ref 2.0–3.5)
GLUCOSE: 117 mg/dL — ABNORMAL HIGH (ref 74–109)
OSMOLALITY, CALCULATED: 280 mosm/kg (ref 270–290)
POTASSIUM: 3.6 mmol/L (ref 3.5–5.1)
PROTEIN TOTAL: 7.2 g/dL (ref 6.4–8.9)
SODIUM: 140 mmol/L (ref 136–145)

## 2024-02-04 LAB — LACTIC ACID LEVEL W/ REFLEX FOR LEVEL >2.0: LACTIC ACID: 1.1 mmol/L (ref 0.5–2.2)

## 2024-02-04 LAB — MAGNESIUM: MAGNESIUM: 1.8 mg/dL — ABNORMAL LOW (ref 1.9–2.7)

## 2024-02-04 LAB — TROPONIN-I: TROPONIN I: <2 ng/L (ref <15)

## 2024-02-04 LAB — PTT (PARTIAL THROMBOPLASTIN TIME): APTT: 29.2 s (ref 25.0–38.0)

## 2024-02-04 LAB — PT/INR
INR: 1.03 (ref 0.84–1.10)
PROTHROMBIN TIME: 11.6 s (ref 9.8–12.7)

## 2024-02-04 MED ORDER — NAPROXEN 500 MG TABLET
500.0000 mg | ORAL_TABLET | Freq: Two times a day (BID) | ORAL | 0 refills | Status: AC | PRN
Start: 2024-02-04 — End: 2024-02-14

## 2024-02-04 MED ORDER — KETOROLAC 30 MG/ML (1 ML) INJECTION SOLUTION
INTRAMUSCULAR | Status: AC
Start: 2024-02-04 — End: 2024-02-04
  Filled 2024-02-04: qty 1

## 2024-02-04 MED ORDER — KETOROLAC 30 MG/ML (1 ML) INJECTION SOLUTION
30.0000 mg | INTRAMUSCULAR | Status: AC
Start: 2024-02-04 — End: 2024-02-04
  Administered 2024-02-04: 30 mg via INTRAVENOUS

## 2024-02-04 MED ORDER — ASPIRIN 81 MG CHEWABLE TABLET
324.0000 mg | CHEWABLE_TABLET | ORAL | Status: AC
Start: 2024-02-04 — End: 2024-02-04
  Administered 2024-02-04: 324 mg via ORAL

## 2024-02-04 MED ORDER — KETOROLAC 30 MG/ML (1 ML) INJECTION SOLUTION
30.0000 mg | INTRAMUSCULAR | Status: DC
Start: 2024-02-04 — End: 2024-02-04

## 2024-02-04 MED ORDER — ASPIRIN 81 MG CHEWABLE TABLET
CHEWABLE_TABLET | ORAL | Status: AC
Start: 2024-02-04 — End: 2024-02-04
  Filled 2024-02-04: qty 4

## 2024-02-04 NOTE — ED Provider Notes (Addendum)
 [facility]  ED Primary Provider Note        Arrival: The patient arrived by Private Vehicle     History of Present Illness   chief complaint  Rebecca Sparks is a 43 y.o. female who had concerns including Chest Pain .  All nursing notes reviewed.  Blood pressure 115/80 with a heart rate of 72, respiratory rate of 18 oxygen saturation 92% on room air.  Afebrile at 97.0    History of Present Illness  Rebecca Sparks is a 43 year old female who presents with chest pain.    Chest pain  - Sharp pain localized to the left upper chest  - Pain onset approximately 20 hours ago upon getting out of bed  - Pain worsens with deep breathing  - No radiation of pain reported  - No associated cough, leg swelling, tenderness, or shortness of breath  - No history of heart disease, recent injuries, or exposure to illness    Headache  - Mild headache accompanying chest pain  - No other neurological symptoms reported    Psychiatric history  - Current medications include Abilify 400 mg injection, Lexapro 15 mg, and Buspar 15 mg for anxiety and depression    Substance use  - Vapes regularly  - No history of cigarette smoking or chewing tobacco    Cardiometabolic risk factors  - No known history of hyperlipidemia, hypertension, or diabetes    Review of Systems     No other overt Review of Systems are noted to be positive except noted in the HPI.      Historical Data   History Reviewed This Encounter: Medical History  Surgical History  Family History  Social History      Physical Exam   ED Triage Vitals [02/04/24 0322]   BP (Non-Invasive) 136/74   Heart Rate 88   Respiratory Rate 20   Temperature 36.1 C (97 F)   SpO2 94 %   Weight 109 kg (240 lb)   Height 1.727 m (5' 8)         Exam:   Constitutional:  Patient alert orient x3 in no apparent distress.  No limitations.  Head: Atraumatic normocephalic  Eyes :  Pupils are equal round reactive to light and accommodation extraocular muscles are intact.   Sclera and conjunctiva are unremarkable  Ears:  Tympanic membranes are pearly gray bilaterally; external auditory canals are unremarkable; external ears without any lesions  Nose:  Nares are patent turbinates are pink and moist  Mouth:  Mucosa is pink and moist without lesions.  Posterior pharynx is pink and moist without hypertrophy/exudate.  Neck:  Soft and supple without palpable lymphadenopathy.  Chest: Patient tender to palpation left upper anterior chest wall.  That has completely reproduces symptoms.  Pain is sharp in nature.  No rashes or lesions  Heart:  Regular rate and rhythm without audible murmur  Lungs:  Clear to auscultation bilaterally without any wheezing/rales/rhonchi  Abdomen:  Soft nontender without any rebound or guarding; positive bowel sounds throughout  Genitalia:  Deferred  Skin:  Warm and dry without lesions.  Normal skin turgor.  Brisk capillary refill distally  Extremities:  Good strength bilaterally with full range of motion of upper and lower extremities.  No leg or calf tenderness, no edema  Neuro:  Alert oriented x3.  Cranial nerves II-XII grossly intact as tested.  Excellent sensation distally over all dermatomes.  Psychiatric:  Patient cooperative, affect appropriate, insight and  judgment good          Procedures           Patient Data     Labs Ordered/Reviewed   COMPREHENSIVE METABOLIC PANEL, NON-FASTING - Abnormal; Notable for the following components:       Result Value    GLUCOSE 117 (*)     ALBUMIN/GLOBULIN RATIO 1.7 (*)     All other components within normal limits    Narrative:     Estimated Glomerular Filtration Rate (eGFR) is calculated using the CKD-EPI (2021) equation, intended for patients 54 years of age and older. If gender is not documented or unknown, there will be no eGFR calculation.     CBC WITH DIFF - Abnormal; Notable for the following components:    LYMPHOCYTE # 3.60 (*)     All other components within normal limits   MAGNESIUM - Abnormal; Notable for the  following components:    MAGNESIUM 1.8 (*)     All other components within normal limits   TROPONIN-I - Normal   LACTIC ACID LEVEL W/ REFLEX FOR LEVEL >2.0 - Normal   PT/INR - Normal    Narrative:     In the setting of warfarin therapy, a moderate-intensity INR goal range is 2.0 to 3.0 and a high-intensity INR goal range is 2.5 to 3.5.    INR is ONLY validated to determine the level of anticoagulation with vitamin K antagonists (warfarin). Other factors may elevate the INR including but not limited to direct oral anticoagulants (DOACs), liver dysfunction, vitamin K deficiency, DIC, factor deficiencies, and factor inhibitors.   PTT (PARTIAL THROMBOPLASTIN TIME) - Normal   CBC/DIFF    Narrative:     The following orders were created for panel order CBC/DIFF.  Procedure                               Abnormality         Status                     ---------                               -----------         ------                     CBC WITH IPQQ[[id number]]                Abnormal            Final result                 Please view results for these tests on the individual orders.       XR CHEST PA AND LATERAL   Final Result by Edi, Radresults In (09/22 0347)   No acute cardiopulmonary process.               Radiologist location ID: [id number]             Medical Decision Making          Medical Decision Making  Amount and/or Complexity of Data Reviewed  Labs: ordered. Decision-making details documented in ED Course.  Radiology: ordered and independent interpretation performed. Decision-making details documented in ED Course.  ECG/medicine tests: ordered.    Risk  OTC drugs.  Prescription drug management.  Critical Care  Total time providing critical care: 0 minutes        Medical Decision Making  42 year old female with a history of anxiety and depression presented with acute sharp left upper chest pain that began upon getting out of bed the previous morning, associated with mild headache but no shortness of breath,  cough, or leg swelling. Exam revealed localized tenderness over the left upper chest wall, with pain worsened by deep inspiration and movement. EKG, cardiac enzymes, and chest x-ray were unremarkable. She is a current vaper and takes Abilify, Lexapro, and Buspar.    Differential diagnosis includes, but is not limited to:  - Musculoskeletal Chest Wall Pain: Localized chest wall tenderness, pain exacerbated by movement and deep breathing, and normal cardiac and pulmonary workup support a musculoskeletal etiology.  - Acute Coronary Syndrome: Considered due to chest pain, but normal EKG and cardiac enzymes, absence of cardiac risk factors, and reassuring exam findings make this unlikely.    Musculoskeletal Chest Wall Pain  - Administered 30 mg Toradol  IV for pain relief.  - Prescribed naproxen  to be taken every 12 hours as needed for discomfort.  - Discharged home.    ED Course as of 02/04/24 0427   Mon Feb 04, 2024   0321 EKG shows normal sinus rhythm with a heart rate of 87, normal axis, normal R-wave progression, no ectopy, no ischemia, normal PR/QRS interval.   0348 Two-view chest shows no acute infiltrate, no acute disease process.  CBC unremarkable   0402 Glucose is 117 otherwise CMP is unremarkable.  Magnesium 1.8, lactic 1.1, PT/INR/PTT are all within normal range       Results  LABS  CBC: Unremarkable (02/04/2024)  Glucose: 117 mg/dL (90/77/7974)  CMP: Unremarkable (02/04/2024)  Magnesium: 1.8 mg/dL (90/77/7974)  Lactate: 1.1 mmol/L (02/04/2024)  PT/INR/PTT: Within normal range (02/04/2024)  Cardiac enzymes: Normal (02/04/2024)    RADIOLOGY  Chest x-ray: No acute infiltrate, no acute disease process (02/04/2024)    DIAGNOSTIC  EKG: Normal (02/04/2024)    Medications Ordered/Administered in the ED   aspirin  chewable tablet 324 mg (324 mg Oral Given 02/04/24 0348)   ketorolac  (TORADOL ) 30 mg/mL injection (30 mg Intravenous Given 02/04/24 0348)       Following the history, physical exam, and ED workup, the patient  was deemed stable and suitable for discharge. The patient/caregiver was advised to return to the ED for any new or worsening symptoms. Discharge medications, and follow-up instructions were discussed with the patient/caregiver in detail, who verbalizes understanding. The patient/caregiver is in agreement and is comfortable with the plan of care.    Disposition: Discharged         Current Discharge Medication List        START taking these medications.        Details   naproxen  500 mg Tablet  Commonly known as: NAPROSYN    500 mg, Oral, 2 TIMES DAILY PRN  Qty: 20 Tablet  Refills: 0            CONTINUE these medications - NO CHANGES were made during your visit.        Details   Abilify Maintena 400 mg suspension,extended rel syring syringe  Generic drug: ARIPiprazole   400 mg, IntraMUSCULAR, EVERY 30 DAYS  Refills: 0     ipratropium bromide 42 mcg (0.06 %) Spray, Non-Aerosol  Commonly known as: ATROVENT   2 Sprays, 3 TIMES DAILY  Refills: 0     meclizine 25 mg Tablet  Commonly  known as: ANTIVERT   1 Tablet, Oral, 3 TIMES DAILY PRN  Refills: 0     metFORMIN 500 mg Tablet Sustained Release 24 hr  Commonly known as: GLUCOPHAGE XR   1 Tablet, 3 TIMES DAILY WITH MEALS  Refills: 0     ondansetron  4 mg Tablet, Rapid Dissolve  Commonly known as: ZOFRAN  ODT   4 mg, Oral, EVERY 8 HOURS PRN  Qty: 12 Tablet  Refills: 0            Follow up:   Darrel Carne, FNP-BC  [address]  [place] [id number]  [phone]    In 3 days  Follow up with the family doctor for recheck in 2-3 days                   Clinical Impression   Chest wall pain (Primary)         Current Discharge Medication List        START taking these medications    Details   naproxen  (NAPROSYN ) 500 mg Oral Tablet Take 1 Tablet (500 mg total) by mouth Twice per day as needed for Pain for up to 10 days  Qty: 20 Tablet, Refills: 0             R.A. Suyash Amory, DO  Department of Emergency Medicine

## 2024-02-04 NOTE — Discharge Instructions (Signed)
 VISIT SUMMARY:  You came in today with sharp chest pain in your left upper chest that started about 20 hours ago. After evaluation, it was determined that your pain is likely due to musculoskeletal issues and not related to your heart. Your EKG and heart enzymes were normal, and your chest x-ray did not show any acute disease.    YOUR PLAN:  MUSCULOSKELETAL CHEST WALL PAIN: You have sharp pain in your left upper chest, which is likely due to musculoskeletal issues.  -You received a 30 mg Toradol  IV injection today.  -You are prescribed naproxen  to take every 12 hours as needed for pain.       Contains text generated by Abridge

## 2024-02-04 NOTE — ED Triage Notes (Signed)
 Chest pain since Sunday morning, worse with inspiration.  Not improving.

## 2024-02-04 NOTE — ED Nurses Note (Signed)
 Nurse in to see patient. Patient alert and oriented x4. Patient discharged home with family at this time. Patient's IV removed, bleeding controlled, pressure dressing applied. AVS reviewed with patient/care giver.  A written copy of the AVS and discharge instructions was given to the patient/care giver.  Questions sufficiently answered as needed.  Patient/care giver encouraged to follow up with PCP as indicated.  In the event of an emergency,patient/care giver instructed to call 911 or go to the nearest emergency room. Patient leaves Emergency Department at this time ambulatory with a steady gait.

## 2024-02-05 DIAGNOSIS — R9431 Abnormal electrocardiogram [ECG] [EKG]: Secondary | ICD-10-CM

## 2024-02-05 LAB — ECG 12 LEAD
Atrial Rate: 87 {beats}/min
Calculated P Axis: 56 degrees
Calculated R Axis: 13 degrees
Calculated T Axis: 11 degrees
PR Interval: 158 ms
QRS Duration: 74 ms
QT Interval: 370 ms
QTC Calculation: 445 ms
Ventricular rate: 87 {beats}/min

## 2024-02-06 NOTE — ED Notes (Signed)
 [facility] - Emergency Department  Peer Recovery Coach Assessment    Initial Evaluation  Referred by:: Nurse  Location of Evaluation: Emergency Department  How many times in the last 12 months have you been to the ED?: 5  Have you ever served or are you currently serving in the Armed Forces?: No             Substance Use History  Patient current substance use status: Missed patient from 9/22 @3 :24am. Patient self disclosed marijuana use. PRSS attempted to contact patient via phone to complete BI but was unable to reach patient.              Within the last 30 days, what substances has the patient used?: Marijuana  Drug route of administration: Smoked                   Family, Social, Home & Safety History                                            Additional comments?: Missed patient from 9/22 @3 :24am. Patient self disclosed marijuana use. PRSS attempted to contact patient via phone to complete BI but was unable to reach patient.    Employment            Engagement  Summary of assessment priority areas: comments: Missed patient from 9/22 @3 :24am. Patient self disclosed marijuana use. PRSS attempted to contact patient via phone to complete BI but was unable to reach patient.    Brief Intervention  Discussed plan to reduce/quit substance use?: No  Discussed willingness to enter treatment?: No    Patient seen by Peer Recovery Coach and is a candidate for buprenorphine  administration in the ED. Patient needs assessment for bup treatment.: No    Plan  Was the patient referred to treatment?: No    Was patient referred to physician for Buprenorphine  Assessment in the ED?: No    Did patient receive Narcan in the ED?: No    Plan: Additional Comments: Missed patient from 9/22 @3 :24am. Patient self disclosed marijuana use. PRSS attempted to contact patient via phone to complete BI but was unable to reach patient.    Follow-up           Need for additional follow-up?: No  Additional comments: Missed patient  from 9/22 @3 :24am. Patient self disclosed marijuana use. PRSS attempted to contact patient via phone to complete BI but was unable to reach patient.    Suzen Satchel, Peer Recovery Coach 02/06/2024 07:49

## 2024-03-30 ENCOUNTER — Emergency Department
Admission: EM | Admit: 2024-03-30 | Discharge: 2024-03-31 | Disposition: A | Attending: NURSE PRACTITIONER | Admitting: NURSE PRACTITIONER

## 2024-03-30 ENCOUNTER — Other Ambulatory Visit: Payer: Self-pay

## 2024-03-30 DIAGNOSIS — R058 Other specified cough: Secondary | ICD-10-CM

## 2024-03-30 DIAGNOSIS — J069 Acute upper respiratory infection, unspecified: Secondary | ICD-10-CM | POA: Insufficient documentation

## 2024-03-30 DIAGNOSIS — R070 Pain in throat: Secondary | ICD-10-CM

## 2024-03-30 DIAGNOSIS — F17290 Nicotine dependence, other tobacco product, uncomplicated: Secondary | ICD-10-CM | POA: Insufficient documentation

## 2024-03-30 DIAGNOSIS — R6883 Chills (without fever): Secondary | ICD-10-CM

## 2024-03-30 DIAGNOSIS — R439 Unspecified disturbances of smell and taste: Secondary | ICD-10-CM

## 2024-03-30 DIAGNOSIS — M791 Myalgia, unspecified site: Secondary | ICD-10-CM

## 2024-03-30 LAB — RAPID THROAT SCREEN, STREPTOCOCCUS, WITH REFLEX: THROAT RAPID SCREEN, STREPTOCOCCUS: NEGATIVE

## 2024-03-31 ENCOUNTER — Emergency Department (HOSPITAL_COMMUNITY)

## 2024-03-31 DIAGNOSIS — R059 Cough, unspecified: Secondary | ICD-10-CM

## 2024-03-31 LAB — COVID-19, FLU A/B, RSV RAPID BY PCR
INFLUENZA VIRUS TYPE A: NOT DETECTED
INFLUENZA VIRUS TYPE B: NOT DETECTED
RESPIRATORY SYNCTIAL VIRUS (RSV): NOT DETECTED
SARS-CoV-2: NOT DETECTED

## 2024-03-31 MED ORDER — PREDNISONE 20 MG TABLET
20.0000 mg | ORAL_TABLET | Freq: Two times a day (BID) | ORAL | 0 refills | Status: AC
Start: 2024-03-31 — End: 2024-04-05

## 2024-03-31 MED ORDER — GUAIFENESIN ER 600 MG TABLET, EXTENDED RELEASE 12 HR
600.0000 mg | EXTENDED_RELEASE_TABLET | Freq: Two times a day (BID) | ORAL | Status: AC
Start: 2024-03-31 — End: ?

## 2024-03-31 MED ORDER — FLUTICASONE PROPIONATE 50 MCG/ACTUATION NASAL SPRAY,SUSPENSION
2.0000 | Freq: Every day | NASAL | 0 refills | Status: AC
Start: 2024-03-31 — End: ?

## 2024-03-31 NOTE — ED Notes (Signed)
 [facility] - Emergency Department  Peer Recovery Coach Assessment    Initial Evaluation  Referred by:: Nurse  Location of Evaluation: Emergency Department  How many times in the last 12 months have you been to the ED?: 6 or more  Have you ever served or are you currently serving in the Armed Forces?: No             Substance Use History  Patient current substance use status: Missed pt on 11/16 @ 10:42pm. 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 No tox screen or BAC. No follow up required, no treatment.                                  Family, Social, Home & Safety History                                                 Employment            Engagement  Readiness ruler: 1    Brief Intervention  Discussed plan to reduce/quit substance use?: Yes  Discussed willingness to enter treatment?: No  Indicated patient's stage of change:: 1 - Precontemplation    Patient seen by Peer Recovery Coach and is a candidate for buprenorphine  administration in the ED. Patient needs assessment for bup treatment.: No    Plan  Was the patient referred to treatment?: No    Was patient referred to physician for Buprenorphine  Assessment in the ED?: No    Did patient receive Narcan in the ED?: No         Follow-up           Need for additional follow-up?: No       Hunter Free, Peer Recovery Coach 03/31/2024 08:49

## 2024-03-31 NOTE — ED Nurses Note (Signed)
 Patient discharged at this time. Patient given a copy of discharge paperwork to review. Patient verbalizes understanding of discharge instructions and all questions answered to satisfaction. Patient out of ER at this time via ambulation.

## 2024-03-31 NOTE — Discharge Instructions (Signed)
 Today in the ER- your FLU, COVID, RSV and STREP were NEGATIVE. Continue to monitor symptoms. Take medications as prescribed. Use over the counter cold and cough medications as needed. Try Chloraseptic spray, honey, cough drops, hot tea to help soothe sore throat/cough. Use Tylenol/Ibuprofen as needed for fever or body aches. Make sure you are staying hydrated with plenty of fluids. Get plenty of rest.  Follow-up with your family doctor within 48-72 hours.  Return to ER immediately for any new or worsening symptoms.

## 2024-03-31 NOTE — ED Provider Notes (Signed)
 [facility]  ED Primary Provider Note        Arrival: The patient arrived by Car     Consent for AI Scribe software Abridge discussed/obtained verbally for encounter with patient    History of Present Illness   chief complaint  Rebecca Sparks is a 43 y.o. female who had concerns including Flu Like Symptoms and Sore Throat.     History of Present Illness  Rebecca Sparks is a 43 year old female who presents with cold symptoms including loss of taste and smell.    She has been experiencing cold chills, inability to taste or smell, sore throat, and body aches for two days. No fever, shortness of breath, wheezing, nausea, vomiting, or diarrhea.    She vapes but has been unable to do so due to her illness, as it exacerbates her cough.    Review of Systems     No other overt Review of Systems are noted to be positive except noted in the HPI.    Historical Data   History Reviewed This Encounter:     Past Medical/Surgical/Social History  Past Medical History:   Diagnosis Date    Bipolar disorder, unspecified     Chronic obstructive airway disease     Schizophrenia        Past Surgical History:   Procedure Laterality Date    COLON SURGERY      pt states they removed approximatley 33 inches of instestines    ENDOMETRIAL ABLATION      HX TUBAL LIGATION      NECK SURGERY         Social History     Socioeconomic History    Marital status: Single   Tobacco Use    Smoking status: Never    Smokeless tobacco: Never   Vaping Use    Vaping status: Every Day   Substance and Sexual Activity    Alcohol use: Never    Drug use: Yes     Frequency: 2.0 times per week     Types: Marijuana     Comment: last use- last night at 1900       Allergies  Allergies[1]        PHYSICAL EXAM  VITAL SIGNS:  Filed Vitals:    03/30/24 2253   BP: 107/60   Pulse: 96   Resp: (!) 22   Temp: 36.6 C (97.8 F)   SpO2: 93%     Constitutional: Awake. Average body weight. Generally healthy appearing. No distress noted.   HEENT:    Head/Face: Normocephalic and atraumatic. Tenderness to frontal sinuses  Mouth/Throat: Oropharynx pink and moist. Postnasal drainage  Eyes: EOMI, PERRL   Ears: External ear normal, no drainage noted; pearly gray TM, bilaterally. No postauricular tenderness, erythema, swelling or mass  Nose:External nose normal, no drainage  Cardiovascular: Regular rate. No murmur or gallop heard.   Pulmonary/Chest: Breath sounds clear and equal bilaterally. No chest tenderness. No respiratory distress.   Musculoskeletal: No tenderness or deformity. Normal muscle tone and strength.   Skin: warm and dry. No rash, redness, or bruising  Psychiatric: normal mood and affect. Behavior is normal.   Neurological: Alert, oriented. Normal gait. No focal weakness noted. No sensory deficit. No speech disturbances         Diagnostics    Labs  Results for orders placed or performed during the hospital encounter of 03/30/24   RAPID THROAT SCREEN, STREPTOCOCCUS, WITH REFLEX    Specimen:  Throat; Swab   Result Value Ref Range    THROAT RAPID SCREEN, STREPTOCOCCUS Negative Negative   COVID-19, FLU A/B, RSV RAPID BY PCR   Result Value Ref Range    SARS-CoV-2 Not Detected Not Detected    INFLUENZA VIRUS TYPE A Not Detected Not Detected    INFLUENZA VIRUS TYPE B Not Detected Not Detected    RESPIRATORY SYNCTIAL VIRUS (RSV) Not Detected Not Detected       Radiology  Results for orders placed or performed during the hospital encounter of 03/30/24   XR CHEST PA AND LATERAL     Status: None    Narrative    [name]    RADIOLOGIST: [name], MD    XR CHEST PA AND LATERAL performed on 03/31/2024 12:36 AM    CLINICAL HISTORY: Cough, congestion.    TECHNIQUE: Frontal and lateral views of the chest.    COMPARISON:  02/04/2024    FINDINGS:    The lungs are clear and symmetrically aerated. The cardiac silhouette and mediastinal contours are normal. The pulmonary vascularity is normal. There is no pleural effusion or pneumothorax. No significant bony  abnormality is visualized.      Impression    No acute cardiopulmonary findings.        Radiologist location ID: [id number]         Results        Medical Decision Making     Medical Decision Making  Rebecca Sparks is a 43 year old female who presents with cold symptoms including loss of taste and smell.She has been experiencing cold chills, inability to taste or smell, sore throat, and body aches for two days. No fever, shortness of breath, wheezing, nausea, vomiting, or diarrhea.  She vapes but has been unable to do so due to her illness, as it exacerbates her cough.    Physical exam reveals tenderness to the frontal sinuses, postnasal drainage.  Lung sounds clear    Patient negative for strep, flu, COVID and RSV.  Chest x-ray unremarkable    Patient will be discharged and encouraged to follow up with PCP.  Prescribed Mucinex, prednisone and Flonase      Medical Decision Making  Amount and/or Complexity of Data Reviewed  Radiology: ordered. Decision-making details documented in ED Course.  ECG/medicine tests: independent interpretation performed.    Risk  OTC drugs.  Prescription drug management.        ED Course  ED Course as of 03/31/24 0051   Mon Mar 31, 2024   0017 THROAT RAPID SCREEN, STREPTOCOCCUS: Negative  Negative    0021 COVID-19, FLU A/B, RSV RAPID BY PCR  Negative    0051 XR CHEST PA AND LATERAL  FINDINGS:     The lungs are clear and symmetrically aerated. The cardiac silhouette and mediastinal contours are normal. The pulmonary vascularity is normal. There is no pleural effusion or pneumothorax. No significant bony abnormality is visualized.     IMPRESSION:     No acute cardiopulmonary findings.                 Clinical Impression   Upper respiratory tract infection, unspecified type (Primary)         Following the history, physical exam, and ED workup, the patient was deemed stable and suitable for discharge. The patient/caregiver was advised to return to the ED for any new or worsening symptoms.  Discharge medications, and follow-up instructions were discussed with the patient/caregiver in detail, who  verbalizes understanding. The patient/caregiver is in agreement and is comfortable with the plan of care.    Disposition: Discharged         Current Discharge Medication List        START taking these medications.        Details   fluticasone propionate 50 mcg/actuation Spray, Suspension  Commonly known as: FLONASE   2 Sprays, Each Nostril, Daily  Qty: 16 g  Refills: 0     guaiFENesin 600 mg Tablet Extended Release 12hr  Commonly known as: MUCINEX   600 mg, Oral, EVERY 12 HOURS  Refills: 0     predniSONE 20 mg Tablet  Commonly known as: DELTASONE   20 mg, Oral, 2 TIMES DAILY  Qty: 10 Tablet  Refills: 0            CONTINUE these medications - NO CHANGES were made during your visit.        Details   Abilify Maintena 400 mg suspension,extended rel syring syringe  Generic drug: ARIPiprazole   400 mg, IntraMUSCULAR, EVERY 30 DAYS  Refills: 0     ipratropium bromide 42 mcg (0.06 %) Spray, Non-Aerosol  Commonly known as: ATROVENT   2 Sprays, 3 TIMES DAILY  Refills: 0     meclizine 25 mg Tablet  Commonly known as: ANTIVERT   1 Tablet, Oral, 3 TIMES DAILY PRN  Refills: 0     metFORMIN 500 mg Tablet Sustained Release 24 hr  Commonly known as: GLUCOPHAGE XR   1 Tablet, 3 TIMES DAILY WITH MEALS  Refills: 0     ondansetron  4 mg Tablet, Rapid Dissolve  Commonly known as: ZOFRAN  ODT   4 mg, Oral, EVERY 8 HOURS PRN  Qty: 12 Tablet  Refills: 0                Jimi Schappert K. Darwin Rothlisberger- APRN, FNP-C, [facility]  [facility]  Department of Emergency Medicine             [1] No Known Allergies

## 2024-04-01 LAB — THROAT CULTURE, BETA HEMOLYTIC STREPTOCOCCUS: THROAT CULTURE: NORMAL

## 2024-04-07 ENCOUNTER — Emergency Department (HOSPITAL_COMMUNITY)

## 2024-04-07 ENCOUNTER — Encounter (HOSPITAL_COMMUNITY): Payer: Self-pay

## 2024-04-07 ENCOUNTER — Emergency Department
Admission: EM | Admit: 2024-04-07 | Discharge: 2024-04-07 | Disposition: A | Discharge status: Home / Self Care | Attending: NURSE PRACTITIONER | Admitting: NURSE PRACTITIONER

## 2024-04-07 ENCOUNTER — Other Ambulatory Visit: Payer: Self-pay

## 2024-04-07 DIAGNOSIS — J029 Acute pharyngitis, unspecified: Secondary | ICD-10-CM | POA: Insufficient documentation

## 2024-04-07 DIAGNOSIS — Z87440 Personal history of urinary (tract) infections: Secondary | ICD-10-CM | POA: Insufficient documentation

## 2024-04-07 DIAGNOSIS — Z3202 Encounter for pregnancy test, result negative: Secondary | ICD-10-CM | POA: Insufficient documentation

## 2024-04-07 DIAGNOSIS — N390 Urinary tract infection, site not specified: Secondary | ICD-10-CM

## 2024-04-07 DIAGNOSIS — D72829 Elevated white blood cell count, unspecified: Secondary | ICD-10-CM | POA: Insufficient documentation

## 2024-04-07 DIAGNOSIS — F17290 Nicotine dependence, other tobacco product, uncomplicated: Secondary | ICD-10-CM | POA: Insufficient documentation

## 2024-04-07 DIAGNOSIS — N3001 Acute cystitis with hematuria: Secondary | ICD-10-CM | POA: Insufficient documentation

## 2024-04-07 DIAGNOSIS — R10A1 Flank pain, right side: Secondary | ICD-10-CM

## 2024-04-07 DIAGNOSIS — E876 Hypokalemia: Secondary | ICD-10-CM

## 2024-04-07 DIAGNOSIS — K760 Fatty (change of) liver, not elsewhere classified: Secondary | ICD-10-CM | POA: Insufficient documentation

## 2024-04-07 DIAGNOSIS — R160 Hepatomegaly, not elsewhere classified: Secondary | ICD-10-CM | POA: Insufficient documentation

## 2024-04-07 DIAGNOSIS — R10A0 Flank pain, unspecified side: Secondary | ICD-10-CM

## 2024-04-07 LAB — COMPREHENSIVE METABOLIC PANEL, NON-FASTING
ALBUMIN/GLOBULIN RATIO: 1.4 (ref 0.8–1.4)
ALBUMIN: 4.3 g/dL (ref 3.5–5.7)
ALKALINE PHOSPHATASE: 83 U/L (ref 34–104)
ALT (SGPT): 40 U/L (ref 7–52)
ANION GAP: 10 mmol/L (ref 4–13)
AST (SGOT): 22 U/L (ref 13–39)
BILIRUBIN TOTAL: 0.5 mg/dL (ref 0.3–1.0)
BUN/CREA RATIO: 6 (ref 6–22)
BUN: 7 mg/dL (ref 7–25)
CALCIUM, CORRECTED: 8.7 mg/dL — ABNORMAL LOW (ref 8.9–10.8)
CALCIUM: 8.9 mg/dL (ref 8.6–10.3)
CHLORIDE: 103 mmol/L (ref 98–107)
CO2 TOTAL: 26 mmol/L (ref 21–31)
CREATININE: 1.1 mg/dL (ref 0.60–1.30)
ESTIMATED GFR: 64 mL/min/1.73mˆ2 (ref >59)
GLOBULIN: 3 (ref 2.0–3.5)
GLUCOSE: 146 mg/dL — ABNORMAL HIGH (ref 74–109)
OSMOLALITY, CALCULATED: 278 mosm/kg (ref 270–290)
POTASSIUM: 3.2 mmol/L — ABNORMAL LOW (ref 3.5–5.1)
PROTEIN TOTAL: 7.3 g/dL (ref 6.4–8.9)
SODIUM: 139 mmol/L (ref 136–145)

## 2024-04-07 LAB — URINALYSIS, MACROSCOPIC
BILIRUBIN: NEGATIVE mg/dL
BLOOD: 0.06 mg/dL — AB
GLUCOSE: NEGATIVE mg/dL
KETONES: NEGATIVE mg/dL
LEUKOCYTES: 500 WBCs/uL — AB
PH: 5.5 (ref 5.0–9.0)
PROTEIN: 20 mg/dL
SPECIFIC GRAVITY: 1.016 (ref 1.002–1.030)
UROBILINOGEN: NORMAL mg/dL

## 2024-04-07 LAB — URINALYSIS, MICROSCOPIC
RBCS: 25 /HPF — ABNORMAL HIGH (ref <4)
SQUAMOUS EPITHELIAL: 23 /HPF (ref <28)
TRANSITIONAL EPITHELIAL CELLS URINE: <1 /HPF (ref <6)
WBCS: 117 /HPF — ABNORMAL HIGH (ref <6)

## 2024-04-07 LAB — HCG, URINE QUALITATIVE, PREGNANCY: HCG URINE QUALITATIVE: NEGATIVE

## 2024-04-07 LAB — CBC WITH DIFF
BASOPHIL #: 0.1 x10ˆ3/uL (ref 0.00–0.10)
BASOPHIL %: 1 % (ref 0–1)
EOSINOPHIL #: 0.1 x10ˆ3/uL (ref 0.00–0.50)
EOSINOPHIL %: 1 % (ref 1–7)
HCT: 44.3 % — ABNORMAL HIGH (ref 31.2–41.9)
HGB: 14.9 g/dL — ABNORMAL HIGH (ref 10.9–14.3)
LYMPHOCYTE #: 3.4 x10ˆ3/uL — ABNORMAL HIGH (ref 1.10–3.10)
LYMPHOCYTE %: 29 % (ref 16–46)
MCH: 29.8 pg (ref 24.7–32.8)
MCHC: 33.6 g/dL (ref 32.3–35.6)
MCV: 88.7 fL (ref 75.5–95.3)
MONOCYTE #: 1.1 x10ˆ3/uL — ABNORMAL HIGH (ref 0.20–0.90)
MONOCYTE %: 10 % (ref 4–11)
MPV: 8.4 fL (ref 7.9–10.8)
NEUTROPHIL #: 6.8 x10ˆ3/uL (ref 1.90–8.20)
NEUTROPHIL %: 60 % (ref 43–77)
PLATELETS: 282 x10ˆ3/uL (ref 140–440)
RBC: 4.99 x10ˆ6/uL — ABNORMAL HIGH (ref 3.63–4.92)
RDW: 14.1 % (ref 12.3–17.7)
WBC: 11.4 x10ˆ3/uL (ref 3.8–11.8)

## 2024-04-07 LAB — RAPID THROAT SCREEN, STREPTOCOCCUS, WITH REFLEX: THROAT RAPID SCREEN, STREPTOCOCCUS: NEGATIVE

## 2024-04-07 LAB — LACTIC ACID LEVEL W/ REFLEX FOR LEVEL >2.0: LACTIC ACID: 2.3 mmol/L — ABNORMAL HIGH (ref 0.5–2.2)

## 2024-04-07 MED ORDER — ONDANSETRON 4 MG DISINTEGRATING TABLET: 4.0000 mg | ORAL_TABLET | Freq: Three times a day (TID) | ORAL | 0 refills | Status: DC | PRN

## 2024-04-07 MED ORDER — CIPROFLOXACIN 500 MG TABLET
500.0000 mg | ORAL_TABLET | Freq: Two times a day (BID) | ORAL | 0 refills | Status: AC
Start: 2024-04-07 — End: 2024-04-14

## 2024-04-07 MED ORDER — KETOROLAC 60 MG/2 ML INTRAMUSCULAR SOLUTION
INTRAMUSCULAR | Status: AC
Start: 2024-04-07 — End: 2024-04-07
  Filled 2024-04-07: qty 2

## 2024-04-07 MED ORDER — LIDOCAINE HCL 10 MG/ML (1 %) INJECTION SOLUTION
INTRAMUSCULAR | Status: AC
Start: 2024-04-07 — End: 2024-04-07
  Filled 2024-04-07: qty 50

## 2024-04-07 MED ORDER — KETOROLAC 60 MG/2 ML INTRAMUSCULAR SOLUTION
60.0000 mg | INTRAMUSCULAR | Status: AC
Start: 2024-04-07 — End: 2024-04-07
  Administered 2024-04-07: 60 mg via INTRAMUSCULAR

## 2024-04-07 MED ORDER — LIDOCAINE HCL 10 MG/ML (1 %) INJECTION SOLUTION
1000.0000 mg | Freq: Once | INTRAMUSCULAR | Status: AC
Start: 2024-04-07 — End: 2024-04-07
  Administered 2024-04-07: 1000 mg via INTRAMUSCULAR

## 2024-04-07 MED ORDER — CEFTRIAXONE 1 GRAM SOLUTION FOR INJECTION
INTRAMUSCULAR | Status: AC
Start: 2024-04-07 — End: 2024-04-07
  Filled 2024-04-07: qty 10

## 2024-04-07 MED ORDER — POTASSIUM CHLORIDE ER 20 MEQ TABLET,EXTENDED RELEASE(PART/CRYST)
ORAL_TABLET | ORAL | Status: AC
Start: 2024-04-07 — End: 2024-04-07
  Filled 2024-04-07: qty 2

## 2024-04-07 MED ORDER — POTASSIUM CHLORIDE ER 20 MEQ TABLET,EXTENDED RELEASE(PART/CRYST)
40.0000 meq | ORAL_TABLET | ORAL | Status: AC
Start: 2024-04-07 — End: 2024-04-07
  Administered 2024-04-07: 40 meq via ORAL

## 2024-04-07 MED ORDER — NAPROXEN 500 MG TABLET: 500.0000 mg | ORAL_TABLET | Freq: Two times a day (BID) | ORAL | 0 refills | Status: DC

## 2024-04-07 MED ORDER — FLUCONAZOLE 150 MG TABLET
ORAL_TABLET | ORAL | 0 refills | Status: AC
Start: 2024-04-07 — End: ?

## 2024-04-07 NOTE — ED Provider Notes (Signed)
 [facility]  ED Primary Provider Note        Arrival: The patient arrived by Private Vehicle     Consent for AI Scribe software Abridge discussed/obtained verbally for encounter with patient    History of Present Illness   chief complaint  Rebecca Sparks is a 43 y.o. female who had concerns including Abdominal Pain and Sore Throat.     History of Present Illness  Rebecca Sparks is a 43 year old female who presents with right-sided back pain.    She has been experiencing a constant aching pain on the right side of her back for the past two days, rated as 7 out of 10 in severity. The pain is localized to the right side and does not radiate to the front. There are no specific factors that alleviate or worsen the pain.    No nausea or vomiting, but she had diarrhea yesterday. She noticed spotting blood in her urine but denies any burning sensation during urination. She has no history of kidney stones, although she was previously tested for them during a past UTI episode.    She recently experienced an upper respiratory infection, which is now improving. She vapes but does not smoke. She has not taken any medications such as Tylenol  or ibuprofen  for the pain.    Review of Systems     No other overt Review of Systems are noted to be positive except noted in the HPI.    Historical Data   History Reviewed This Encounter:     Past Medical/Surgical/Social History  Past Medical History:   Diagnosis Date    Bipolar disorder, unspecified     Chronic obstructive airway disease     Schizophrenia        Past Surgical History:   Procedure Laterality Date    COLON SURGERY      pt states they removed approximatley 33 inches of instestines    ENDOMETRIAL ABLATION      HX TUBAL LIGATION      NECK SURGERY         Social History     Socioeconomic History    Marital status: Single   Tobacco Use    Smoking status: Never    Smokeless tobacco: Never   Vaping Use    Vaping status: Every Day   Substance  and Sexual Activity    Alcohol use: Never    Drug use: Yes     Frequency: 2.0 times per week     Types: Marijuana     Comment: last use- last night at 1900       Allergies  Allergies[1]        PHYSICAL EXAM  VITAL SIGNS:  Filed Vitals:    04/07/24 2016 04/07/24 2147   BP: 128/82    Pulse: 84    Resp: 18    Temp: (!) 35.7 C (96.3 F) 37.1 C (98.7 F)   SpO2: 96%      Constitutional: Awake. Average body weight. Generally healthy appearing. No distress noted.   Cardiovascular: Regular rate. No murmur or gallop heard.   Pulmonary/Chest: Breath sounds clear and equal bilaterally. No chest tenderness. No respiratory distress.   Abdominal: Bowel sound normal. Abdomen soft, RUQ tenderness. R CVA tenderness        Musculoskeletal: No tenderness or deformity. Normal muscle tone and strength.   Skin: warm and dry. No rash, redness, or bruising  Psychiatric: normal mood and affect. Behavior is  normal.   Neurological: Alert, oriented. Normal gait. No focal weakness noted. No sensory deficit. No speech disturbances         Diagnostics    Labs  Results for orders placed or performed during the hospital encounter of 04/07/24   RAPID THROAT SCREEN, STREPTOCOCCUS, WITH REFLEX    Specimen: Throat; Swab   Result Value Ref Range    THROAT RAPID SCREEN, STREPTOCOCCUS Negative Negative   COMPREHENSIVE METABOLIC PANEL, NON-FASTING   Result Value Ref Range    SODIUM 139 136 - 145 mmol/L    POTASSIUM 3.2 (L) 3.5 - 5.1 mmol/L    CHLORIDE 103 98 - 107 mmol/L    CO2 TOTAL 26 21 - 31 mmol/L    ANION GAP 10 4 - 13 mmol/L    BUN 7 7 - 25 mg/dL    CREATININE 8.89 9.39 - 1.30 mg/dL    BUN/CREA RATIO 6 6 - 22    ESTIMATED GFR 64 >59 mL/min/1.5m^2    ALBUMIN 4.3 3.5 - 5.7 g/dL    CALCIUM 8.9 8.6 - 89.6 mg/dL    GLUCOSE 853 (H) 74 - 109 mg/dL    ALKALINE PHOSPHATASE 83 34 - 104 U/L    ALT (SGPT) 40 7 - 52 U/L    AST (SGOT) 22 13 - 39 U/L    BILIRUBIN TOTAL 0.5 0.3 - 1.0 mg/dL    PROTEIN TOTAL 7.3 6.4 - 8.9 g/dL    ALBUMIN/GLOBULIN RATIO 1.4 0.8 -  1.4    OSMOLALITY, CALCULATED 278 270 - 290 mOsm/kg    CALCIUM, CORRECTED 8.7 (L) 8.9 - 10.8 mg/dL    GLOBULIN 3.0 2.0 - 3.5   LACTIC ACID LEVEL W/ REFLEX FOR LEVEL >2.0   Result Value Ref Range    LACTIC ACID 2.3 (H) 0.5 - 2.2 mmol/L   HCG, URINE QUALITATIVE, PREGNANCY   Result Value Ref Range    HCG URINE QUALITATIVE Negative    CBC WITH DIFF   Result Value Ref Range    WBC 11.4 3.8 - 11.8 x10^3/uL    RBC 4.99 (H) 3.63 - 4.92 x10^6/uL    HGB 14.9 (H) 10.9 - 14.3 g/dL    HCT 55.6 (H) 68.7 - 41.9 %    MCV 88.7 75.5 - 95.3 fL    MCH 29.8 24.7 - 32.8 pg    MCHC 33.6 32.3 - 35.6 g/dL    RDW 85.8 87.6 - 82.2 %    PLATELETS 282 140 - 440 x10^3/uL    MPV 8.4 7.9 - 10.8 fL    NEUTROPHIL % 60 43 - 77 %    LYMPHOCYTE % 29 16 - 46 %    MONOCYTE % 10 4 - 11 %    EOSINOPHIL % 1 1 - 7 %    BASOPHIL % 1 0 - 1 %    NEUTROPHIL # 6.80 1.90 - 8.20 x10^3/uL    LYMPHOCYTE # 3.40 (H) 1.10 - 3.10 x10^3/uL    MONOCYTE # 1.10 (H) 0.20 - 0.90 x10^3/uL    EOSINOPHIL # 0.10 0.00 - 0.50 x10^3/uL    BASOPHIL # 0.10 0.00 - 0.10 x10^3/uL   URINALYSIS, MACROSCOPIC   Result Value Ref Range    COLOR Yellow Colorless, Light Yellow, Yellow    APPEARANCE Turbid (A) Clear    SPECIFIC GRAVITY 1.016 1.002 - 1.030    PH 5.5 5.0 - 9.0    LEUKOCYTES 500 (A) Negative, 100  WBCs/uL    NITRITE 1+ (A) Negative  PROTEIN 20 Negative, 10 , 20  mg/dL    GLUCOSE Negative Negative, 30  mg/dL    KETONES Negative Negative, Trace mg/dL    BILIRUBIN Negative Negative, 0.5 mg/dL    BLOOD 9.93 (A) Negative, 0.03 mg/dL    UROBILINOGEN Normal Normal mg/dL   URINALYSIS, MICROSCOPIC   Result Value Ref Range    BACTERIA Occasional (A) Negative /hpf    MUCOUS Few Rare, Occasional, Few /hpf    RBCS 25 (H) <4 /hpf    WBCS 117 (H) <6 /hpf    WHITE BLOOD CELL CLUMP Many (A) (none) /hpf    SQUAMOUS EPITHELIAL 23 <28 /hpf    TRANSITIONAL EPITHELIAL CELLS URINE <1 <6 /hpf       Radiology  Results for orders placed or performed during the hospital encounter of 04/07/24   XR CHEST PA  AND LATERAL     Status: None    Narrative    Rebecca Sparks    CLINICAL HISTORY:  Age: 69 years  History: recent URI with right flank pain    TECHNIQUE: Frontal and lateral chest radiograph.    COMPARISON: 03/31/2024    FINDINGS:      The lungs are clear. There is no pleural effusion or pneumothorax. Heart size is normal.     Surgical changes from prior lower cervical ACDF.      Impression    No acute cardiopulmonary disease.        Radiologist location ID: [id number]     CT RENAL CALCULI SERIES WO IV CONTRAST     Status: None    Narrative    Monita D Finch    RADIOLOGIST: OLIVA NOTTINGHAM, MD    CT RENAL CALCULI SERIES WO IV CONTRAST performed on 04/07/2024 9:29 PM    CLINICAL HISTORY: R flank pain, hematuria  R flank pain, hematuria, hx colon resection,    TECHNIQUE:  Abdomen and pelvis CT without intravenous contrast.    COMPARISON:  CT abdomen pelvis 11/05/2021.    FINDINGS:  Noncontrast technique limits evaluation of the abdominal and pelvic viscera.    Lung bases: Clear    Liver:   Enlarged at 20 cm in length and low attenuating representing fatty infiltration.    Gallbladder:   Unremarkable.    Spleen:   Unremarkable.    Pancreas:   Unremarkable.    Adrenals:   Unremarkable.    Kidneys:   No calculi or hydronephrosis. There is right perinephric stranding. The ureters are unobstructed.    Bladder:  Minimally distended with fluid.    Uterus and Adnexa:  The uterus is anteverted. There are bilateral tubal ligation clips.    Bowel:   Stable right lower quadrant anastomosis. No evidence of obstruction. Stable additional anastomosis in the rectosigmoid region.    Appendix:  Normal.    Lymph nodes:  No suspicious lymph node enlargement.    Vasculature:   Major vascular structures are unremarkable.     Peritoneum / Retroperitoneum: No ascites.  No free air.    Bones:   There is a fat-containing umbilical hernia. The imaged osseous structures are without acute findings.          Impression    Right perinephric  stranding. No nephroureterolithiasis or hydronephrosis. Correlate for infection.    Hepatomegaly and steatosis.          Radiologist location ID: [id number]         Results  LABS  CBC: WBC 11.4 x10^3/L, Neutrophils 60% (04/07/2024)  Potassium: 3.2 mmol/L (04/07/2024)  Creatinine: 1.10 mg/dL (88/75/7974)  GFR: 64 fO/fpw/8.26f^7 (04/07/2024)  BUN: 7 mg/dL (88/75/7974)  Total Bilirubin: 0.5 mg/dL (88/75/7974)  ALT: 40 U/L (04/07/2024)  AST: 22 U/L (04/07/2024)  Alk Phos: 83 mg/dL (88/75/7974)  Pregnancy Test: Negative (04/07/2024)  Strep Test: Negative (04/07/2024)  Urinalysis: Leukocyte esterase 500, Nitrites 1+, WBC >100/HPF, Hematuria present (04/07/2024)    RADIOLOGY  Chest X-ray: No acute cardiopulmonary disease (04/07/2024)  CT Renal Calculus Series: Right perinephric stranding, no nephrolithiasis or hydronephrosis (04/07/2024)    Medical Decision Making     Medical Decision Making  43 year old female presented with two days of constant, aching right flank and back pain, rated 7/10, associated with hematuria and a single episode of diarrhea. She denied nausea, vomiting, dysuria, or fever, and reported recent improvement in upper respiratory symptoms. Past history includes prior urinary tract infection but no known kidney stones. Physical exam revealed right flank and back tenderness without abdominal or left-sided tenderness. Labs showed leukocytosis, hypokalemia, and urinalysis consistent with infection and hematuria. Imaging demonstrated right perinephric stranding without nephrolithiasis or hydronephrosis.    Differential diagnosis includes, but is not limited to:  - Acute cystitis with hematuria: Supported by urinalysis findings of leukocytes, nitrites, white blood cells, and hematuria, with right perinephric stranding on CT and absence of fever or severe systemic symptoms.  - Nephrolithiasis: Considered due to right flank pain and hematuria, with no prior history of kidney stones but risk factors present;  CT showed no evidence of stones or hydronephrosis.  - Appendicitis: Considered given right-sided pain, though absence of abdominal tenderness and lack of nausea or vomiting make this less likely; CT did not show findings consistent with appendicitis.  - Gallbladder disease: Considered due to right-sided pain, but absence of abdominal tenderness and lack of gastrointestinal symptoms make this less likely; CT did not show gallbladder pathology.    Acute cystitis with hematuria and right flank pain  - Administered Rocephin  and Toradol  in the ED.  Patient reports she is feeling much better after receiving Toradol .  Rates her pain has decreased to a 3 to 4/10  - Prescribed Cipro  and Naproxen   - Advised follow-up with PCP within 24 to 48 hours  - Provided strict return to ED precautions for worsening symptoms such as increased pain, fever, nausea, vomiting    Hypokalemia  - Administered supplemental oral potassium    Medical Decision Making  Amount and/or Complexity of Data Reviewed  Radiology: ordered. Decision-making details documented in ED Course.  ECG/medicine tests: independent interpretation performed.    Risk  Prescription drug management.        ED Course  ED Course as of 04/07/24 2152   Mon Apr 07, 2024   2111 WBC: 11.4  Normal. PMNs 60   2111 PREGNANCY, URINE: Negative  Negative    2112 THROAT RAPID SCREEN, STREPTOCOCCUS: Negative  Negative    2112 LEUKOCYTES(!): 500  Nitrite negative. WBC's 117 with hematuria    2125 POTASSIUM(!): 3.2  Slightly low   2125 CREATININE: 1.10  GFR 64 BUN 7   2125 BILIRUBIN, TOTAL: 0.5  ALT 40 AST 22 Alk Phos 83   2146 CT RENAL CALCULI SERIES WO IV CONTRAST  IMPRESSION:  Right perinephric stranding. No nephroureterolithiasis or hydronephrosis. Correlate for infection.     Hepatomegaly and steatosis.        2152 XR CHEST PA AND LATERAL  FINDINGS:        The lungs are clear. There is no pleural effusion  or pneumothorax. Heart size is normal.      Surgical changes from prior lower  cervical ACDF.     IMPRESSION:  No acute cardiopulmonary disease.              Clinical Impression   Acute cystitis with hematuria (Primary)   Hypokalemia         Following the history, physical exam, and ED workup, the patient was deemed stable and suitable for discharge. The patient/caregiver was advised to return to the ED for any new or worsening symptoms. Discharge medications, and follow-up instructions were discussed with the patient/caregiver in detail, who verbalizes understanding. The patient/caregiver is in agreement and is comfortable with the plan of care.    Disposition: Discharged         Current Discharge Medication List        START taking these medications.        Details   ciprofloxacin  HCl 500 mg Tablet  Commonly known as: CIPRO    500 mg, Oral, 2 TIMES DAILY  Qty: 14 Tablet  Refills: 0     fluconazole  150 mg Tablet  Commonly known as: DIFLUCAN    Take 1 tablet today, then repeat in 3 days if needed  Qty: 2 Tablet  Refills: 0     naproxen  500 mg Tablet  Commonly known as: NAPROSYN    500 mg, Oral, 2 TIMES DAILY WITH FOOD  Qty: 30 Tablet  Refills: 0            CONTINUE these medications which have CHANGED during your visit.        Details   * ondansetron  4 mg Tablet, Rapid Dissolve  Commonly known as: ZOFRAN  ODT  What changed: Another medication with the same name was added. Make sure you understand how and when to take each.   4 mg, Oral, EVERY 8 HOURS PRN  Qty: 12 Tablet  Refills: 0     * ondansetron  4 mg Tablet, Rapid Dissolve  Commonly known as: ZOFRAN  ODT  What changed: You were already taking a medication with the same name, and this prescription was added. Make sure you understand how and when to take each.   4 mg, Oral, EVERY 8 HOURS PRN  Qty: 12 Tablet  Refills: 0           * This list has 2 medication(s) that are the same as other medications prescribed for you. Read the directions carefully, and ask your doctor or other care provider to review them with you.                CONTINUE these  medications - NO CHANGES were made during your visit.        Details   Abilify Maintena 400 mg suspension,extended rel syring syringe  Generic drug: ARIPiprazole   400 mg, IntraMUSCULAR, EVERY 30 DAYS  Refills: 0     fluticasone  propionate 50 mcg/actuation Spray, Suspension  Commonly known as: FLONASE    2 Sprays, Each Nostril, Daily  Qty: 16 g  Refills: 0     guaiFENesin  600 mg Tablet Extended Release 12hr  Commonly known as: MUCINEX    600 mg, Oral, EVERY 12 HOURS  Refills: 0     ipratropium bromide 42 mcg (0.06 %) Spray, Non-Aerosol  Commonly known as: ATROVENT   2 Sprays, 3 TIMES DAILY  Refills: 0     meclizine 25 mg Tablet  Commonly known as: ANTIVERT   1 Tablet, Oral, 3 TIMES DAILY PRN  Refills: 0  metFORMIN 500 mg Tablet Sustained Release 24 hr  Commonly known as: GLUCOPHAGE XR   1 Tablet, 3 TIMES DAILY WITH MEALS  Refills: 0            ASK your doctor about these medications.        Details   predniSONE  20 mg Tablet  Commonly known as: DELTASONE   Ask about: Should I take this medication?   20 mg, Oral, 2 TIMES DAILY  Qty: 10 Tablet  Refills: 0            Follow up:   Darrel Carne, APRN, CNP  [address]  [place] [id number]  [phone]    Schedule an appointment as soon as possible for a visit             Shawnese Magner K. Gusta Marksberry- APRN, FNP-C, [facility]  [facility]  Department of Emergency Medicine             [1] No Known Allergies

## 2024-04-07 NOTE — ED Triage Notes (Signed)
 Pt states she has right flank pain, 7/10. Pt states she is spotting, which she does not normally do. Ablation in 2018. Pt had recent URI and continues to have a sore throat. Pt continues to cough, less frequently at this time.

## 2024-04-07 NOTE — ED Triage Notes (Signed)
 [facility] - Emergency Department   Physician/APP in Triage Note  Medical Screening Exam     Date and Time of Assessment: 04/07/2024 20:16     Chief Complaint   Patient presents with    Abdominal Pain    Sore Throat       History of Present Illness  Rebecca Sparks is a 43 year old female who presents with right-sided pain and throat pain.    She experiences right-sided pain rated as 7 out of 10 in severity, located on her right side and radiating to her back. This pain is associated with a previous urinary tract infection (UTI) for which she received medication. However, she left before a kidney stone could be ruled out due to a power outage at the facility. She had diarrhea yesterday, but it has since resolved. No current nausea or vomiting.    She reports spotting, which she finds unusual. She has a history of an ablation performed around 2018 and retains her left ovary and uterus.    She complains of throat pain that has persisted despite recent treatment for an upper respiratory infection. While her other symptoms have cleared, the throat pain remains. Her cough is not completely gone but is not severe.    Focused Physical Exam:   ED Triage Vitals [04/07/24 2016]   BP (Non-Invasive) 128/82   Heart Rate 84   Respiratory Rate 18   Temperature (!) 35.7 C (96.3 F)   SpO2    Weight 109 kg (240 lb)   Height 1.727 m (5' 8)     Physical Exam  Gen: Nontoxic in appearance.  Head:  Atraumatic  Skin: Warm, dry.  Psych: Patient does have capacity.     Assessment: Medical Screening Exam.     Plan/MDM at Triage:  I have considered labs, imaging, EKG.  These have been ordered as clinically indicated.  I have considered that the patient may need medications.  However, due to treatment space limitation, medications considered have to be appropriate for the waiting room.    I have considered the patient's chronic conditions and have counseled the patient that we will be evaluating for the presence of an  emergency medical condition today in which their chronic conditions may or may not play a role.  I have advised that the patient should notify their PCP of today's visit regardless of disposition.  I have considered the patient's social determinants of health and health care literacy while determining the patient's initial plan of care.  I have further advised that the patient will possibly see a 2nd emergency provider today during their stay to help promote throughput.     I have performed an initial medical screening evaluation in order to determine if the patient has an emergency medical condition. The patient has been evaluated.  Imminent life, limb, or sight threatening emergency will be taken to the treatment area immediately. The patient's orders will be reviewed by nursing staff and placed in the treatment area as soon as possible as bed capacity allows.  Should the patient's status change, or a new sign/symptom develop, immediate notification of nursing or ED staff has been advised.          Preliminary Plan:  Labs ordered  Imaging ordered  Patient will return to waiting room    Medical Decision Making    Lauraine LITTIE Crock, [place]  04/07/2024 20:21

## 2024-04-07 NOTE — ED Nurses Note (Signed)
 Patient discharged home with family. AVS reviewed with patient. A written copy of the AVS and discharge instructions were given to the patient. Questions sufficiently answered as needed. Patient encouraged to follow up with PCP as indicated. In the event of an emergency, patient instructed to call 911 or go to the nearest emergency room. Patient left department via ambulation.

## 2024-04-07 NOTE — Discharge Instructions (Addendum)
 VISIT SUMMARY:  You came in today with right-sided back pain that you have been experiencing for the past two days. You also had some blood in your urine and diarrhea. After evaluating your symptoms and running some tests, we have identified a urinary tract infection and low potassium levels.    YOUR PLAN:  -RIGHT FLANK PAIN WITH HEMATURIA AND DIARRHEA: You have a urinary tract infection, which is an infection in any part of your urinary system. We confirmed this with a urinalysis that showed signs of infection. We gave you Toradol  for pain relief and prescribed Cipro , an antibiotic, to treat the infection. We also prescribed naproxen  for additional pain relief. Please follow up with your primary care provider within the next 24 to 48 hours. If your symptoms worsen, return to the emergency department immediately.    -HYPOKALEMIA: You have low potassium levels, which can affect muscle function and other bodily processes. We gave you an oral potassium supplement to help bring your levels back to normal.    INSTRUCTIONS:  Please follow up with your primary care provider within the next 24 to 48 hours. If your symptoms worsen, return to the emergency department immediately.       Contains text generated by Abridge

## 2024-04-08 ENCOUNTER — Ambulatory Visit (HOSPITAL_COMMUNITY): Payer: Self-pay | Admitting: Family

## 2024-04-08 NOTE — ED Notes (Signed)
 [facility] - Emergency Department  Peer Recovery Coach Assessment    Initial Evaluation  Referred by:: Nurse  Location of Evaluation: Emergency Department  How many times in the last 12 months have you been to the ED?: 6 or more  Have you ever served or are you currently serving in the Armed Forces?: No             Substance Use History  Patient current substance use status: Missed pt on 11/24 @ 8:21pm. 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 No tox screen or BAC. No follow up required, no treatment.                                  Family, Social, Home & Safety History                                                 Employment            Engagement  Readiness ruler: 1    Brief Intervention  Discussed plan to reduce/quit substance use?: Yes  Discussed willingness to enter treatment?: No  Indicated patient's stage of change:: 1 - Precontemplation    Patient seen by Peer Recovery Coach and is a candidate for buprenorphine  administration in the ED. Patient needs assessment for bup treatment.: No    Plan  Was the patient referred to treatment?: No    Was patient referred to physician for Buprenorphine  Assessment in the ED?: No    Did patient receive Narcan in the ED?: No         Follow-up           Need for additional follow-up?: No  Additional comments: Missed pt on 11/24 @ 8:21pm. 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 No tox screen or BAC. No follow up required, no treatment.    Sheriff Rodenberg, Peer Recovery Coach 04/08/2024 11:15

## 2024-04-09 LAB — URINE CULTURE,ROUTINE: URINE CULTURE: >100000 — AB

## 2024-04-09 LAB — THROAT CULTURE, BETA HEMOLYTIC STREPTOCOCCUS: THROAT CULTURE: NORMAL

## 2024-05-09 ENCOUNTER — Other Ambulatory Visit: Payer: Self-pay

## 2024-05-09 ENCOUNTER — Emergency Department (HOSPITAL_COMMUNITY): Admission: EM | Admit: 2024-05-09 | Source: Home / Self Care

## 2024-05-11 ENCOUNTER — Encounter (HOSPITAL_COMMUNITY): Payer: Self-pay

## 2024-05-11 ENCOUNTER — Emergency Department (HOSPITAL_COMMUNITY)

## 2024-05-11 ENCOUNTER — Other Ambulatory Visit: Payer: Self-pay

## 2024-05-11 ENCOUNTER — Emergency Department
Admission: EM | Admit: 2024-05-11 | Discharge: 2024-05-11 | Disposition: A | Discharge status: Home / Self Care | Attending: Physician Assistant | Admitting: Physician Assistant

## 2024-05-11 DIAGNOSIS — K5900 Constipation, unspecified: Secondary | ICD-10-CM

## 2024-05-11 DIAGNOSIS — R10819 Abdominal tenderness, unspecified site: Secondary | ICD-10-CM | POA: Insufficient documentation

## 2024-05-11 DIAGNOSIS — R9389 Abnormal findings on diagnostic imaging of other specified body structures: Secondary | ICD-10-CM | POA: Insufficient documentation

## 2024-05-11 DIAGNOSIS — K760 Fatty (change of) liver, not elsewhere classified: Secondary | ICD-10-CM | POA: Insufficient documentation

## 2024-05-11 DIAGNOSIS — Z3202 Encounter for pregnancy test, result negative: Secondary | ICD-10-CM | POA: Insufficient documentation

## 2024-05-11 LAB — COMPREHENSIVE METABOLIC PANEL, NON-FASTING
ALBUMIN/GLOBULIN RATIO: 1.9 — ABNORMAL HIGH (ref 0.8–1.4)
ALBUMIN: 4.2 g/dL (ref 3.5–5.7)
ALKALINE PHOSPHATASE: 79 U/L (ref 34–104)
ALT (SGPT): 15 U/L (ref 7–52)
ANION GAP: 8 mmol/L (ref 4–13)
AST (SGOT): 13 U/L (ref 13–39)
BILIRUBIN TOTAL: 0.2 mg/dL — ABNORMAL LOW (ref 0.3–1.0)
BUN/CREA RATIO: 8 (ref 6–22)
BUN: 8 mg/dL (ref 7–25)
CALCIUM, CORRECTED: 8.7 mg/dL — ABNORMAL LOW (ref 8.9–10.8)
CALCIUM: 8.9 mg/dL (ref 8.6–10.3)
CHLORIDE: 108 mmol/L — ABNORMAL HIGH (ref 98–107)
CO2 TOTAL: 24 mmol/L (ref 21–31)
CREATININE: 1.03 mg/dL (ref 0.60–1.30)
ESTIMATED GFR: 69 mL/min/1.73mˆ2 (ref >59)
GLOBULIN: 2.2 (ref 2.0–3.5)
GLUCOSE: 217 mg/dL — ABNORMAL HIGH (ref 74–109)
OSMOLALITY, CALCULATED: 284 mosm/kg (ref 270–290)
POTASSIUM: 4 mmol/L (ref 3.5–5.1)
PROTEIN TOTAL: 6.4 g/dL (ref 6.4–8.9)
SODIUM: 140 mmol/L (ref 136–145)

## 2024-05-11 LAB — URINALYSIS, MICROSCOPIC
RBCS: 4 /HPF — ABNORMAL HIGH (ref <4)
SQUAMOUS EPITHELIAL: 23 /HPF (ref <28)
WBCS: 15 /HPF — ABNORMAL HIGH (ref <6)

## 2024-05-11 LAB — URINALYSIS, MACROSCOPIC
BILIRUBIN: NEGATIVE mg/dL
BLOOD: NEGATIVE mg/dL
GLUCOSE: NEGATIVE mg/dL
KETONES: NEGATIVE mg/dL
LEUKOCYTES: 75 WBCs/uL — AB
NITRITE: NEGATIVE
PH: 6.5 (ref 5.0–9.0)
PROTEIN: NEGATIVE mg/dL
SPECIFIC GRAVITY: 1.025 (ref 1.002–1.030)
UROBILINOGEN: NORMAL mg/dL

## 2024-05-11 LAB — CBC WITH DIFF
BASOPHIL #: 0.1 x10ˆ3/uL (ref 0.00–0.10)
BASOPHIL %: 1 % (ref 0–1)
EOSINOPHIL #: 0.1 x10ˆ3/uL (ref 0.00–0.50)
EOSINOPHIL %: 1 % (ref 1–7)
HCT: 38.9 % (ref 31.2–41.9)
HGB: 13.5 g/dL (ref 10.9–14.3)
LYMPHOCYTE #: 2.9 x10ˆ3/uL (ref 1.10–3.10)
LYMPHOCYTE %: 43 % (ref 16–46)
MCH: 30.7 pg (ref 24.7–32.8)
MCHC: 34.6 g/dL (ref 32.3–35.6)
MCV: 88.7 fL (ref 75.5–95.3)
MONOCYTE #: 0.4 x10ˆ3/uL (ref 0.20–0.90)
MONOCYTE %: 6 % (ref 4–11)
MPV: 8.6 fL (ref 7.9–10.8)
NEUTROPHIL #: 3.3 x10ˆ3/uL (ref 1.90–8.20)
NEUTROPHIL %: 49 % (ref 43–77)
PLATELETS: 277 x10ˆ3/uL (ref 140–440)
RBC: 4.39 x10ˆ6/uL (ref 3.63–4.92)
RDW: 14.3 % (ref 12.3–17.7)
WBC: 6.7 x10ˆ3/uL (ref 3.8–11.8)

## 2024-05-11 LAB — LACTIC ACID - FIRST REFLEX: LACTIC ACID: 1.3 mmol/L (ref 0.5–2.2)

## 2024-05-11 LAB — LACTIC ACID LEVEL W/ REFLEX FOR LEVEL >2.0: LACTIC ACID: 2.4 mmol/L — ABNORMAL HIGH (ref 0.5–2.2)

## 2024-05-11 LAB — HCG QUALITATIVE PREGNANCY, SERUM: PREGNANCY, SERUM QUALITATIVE: NEGATIVE

## 2024-05-11 LAB — C-REACTIVE PROTEIN (CRP): C-REACTIVE PROTEIN (CRP): 0.9 mg/dL — ABNORMAL HIGH (ref 0.1–0.5)

## 2024-05-11 MED ORDER — SODIUM CHLORIDE 0.9% FLUSH BAG - 250 ML: INTRAVENOUS | Status: DC | PRN

## 2024-05-11 MED ORDER — DEXTROSE 5% IN WATER (D5W) FLUSH BAG - 250 ML: INTRAVENOUS | Status: DC | PRN

## 2024-05-11 MED ORDER — SODIUM CHLORIDE 0.9 % (FLUSH) INJECTION SYRINGE
3.0000 mL | INJECTION | Freq: Three times a day (TID) | INTRAMUSCULAR | Status: DC
  Administered 2024-05-11: 0 mL

## 2024-05-11 MED ORDER — SODIUM CHLORIDE 0.9 % (FLUSH) INJECTION SYRINGE: 3.0000 mL | INJECTION | INTRAMUSCULAR | Status: DC | PRN

## 2024-05-11 MED ORDER — IOHEXOL 350 MG IODINE/ML INTRAVENOUS SOLUTION
75.0000 mL | INTRAVENOUS | Status: AC
  Administered 2024-05-11: 75 mL via INTRAVENOUS

## 2024-05-11 MED ORDER — SODIUM CHLORIDE 0.9 % IV BOLUS
1000.0000 mL | INJECTION | Status: AC
  Administered 2024-05-11: 0 mL via INTRAVENOUS
  Administered 2024-05-11: 1000 mL via INTRAVENOUS

## 2024-05-11 NOTE — ED Nurses Note (Signed)

## 2024-05-11 NOTE — ED Triage Notes (Signed)
 Pt. Complains of abdominal and lower back pain tonight. No bowel movement for x4 days. Hx of diverticulosis.

## 2024-05-11 NOTE — ED Provider Notes (Signed)
 [facility] - Emergency Department  ED Primary Note  History of Present Illness   Rebecca Sparks is a 43 y.o. female who had concerns including Abdominal Pain.     Patient is a 43 year old female past medical history of Chronic Obstructive Pulmonary Disease bipolar who presents emergency department with complaints of abdominal pain.  She localizes to the lower area states it started this evening she reports she has not had a bowel movement for the past 4 days she states she is passing gas she denies vomiting states he is able to eat but feels like she gets full easily.  She denies fevers chills chest pain shortness of breath or urinary symptoms    Physical Exam   ED Triage Vitals [05/11/24 0130]   BP (Non-Invasive) 139/71   Heart Rate 89   Respiratory Rate 19   Temperature 36.4 C (97.5 F)   SpO2 99 %   Weight 109 kg (240 lb)   Height 1.727 m (5' 8)     Physical Exam  Constitutional:       Appearance: Normal appearance.   HENT:      Head: Normocephalic.      Nose: Nose normal.      Mouth/Throat:      Mouth: Mucous membranes are moist.   Eyes:      Extraocular Movements: Extraocular movements intact.      Pupils: Pupils are equal, round, and reactive to light.   Cardiovascular:      Rate and Rhythm: Normal rate and regular rhythm.      Pulses: Normal pulses.      Heart sounds: Normal heart sounds.   Pulmonary:      Effort: Pulmonary effort is normal.      Breath sounds: Normal breath sounds.   Abdominal:      General: Abdomen is flat.      Palpations: Abdomen is soft.      Comments: Mild lower abdominal tenderness to palpation   Musculoskeletal:      Cervical back: Normal range of motion.   Neurological:      Mental Status: She is alert.       Patient Data   Labs Ordered/Reviewed   COMPREHENSIVE METABOLIC PANEL, NON-FASTING - Abnormal; Notable for the following components:       Result Value    CHLORIDE 108 (*)     GLUCOSE 217 (*)     BILIRUBIN TOTAL 0.2 (*)     ALBUMIN/GLOBULIN RATIO 1.9 (*)      CALCIUM, CORRECTED 8.7 (*)     All other components within normal limits    Narrative:     Estimated Glomerular Filtration Rate (eGFR) is calculated using the CKD-EPI (2021) equation, intended for patients 53 years of age and older. If gender is not documented or unknown, there will be no eGFR calculation.     C-REACTIVE PROTEIN (CRP) - Abnormal; Notable for the following components:    C-REACTIVE PROTEIN (CRP) 0.9 (*)     All other components within normal limits   LACTIC ACID LEVEL W/ REFLEX FOR LEVEL >2.0 - Abnormal; Notable for the following components:    LACTIC ACID 2.4 (*)     All other components within normal limits   URINALYSIS, MACROSCOPIC - Abnormal; Notable for the following components:    APPEARANCE Turbid (*)     LEUKOCYTES 75 (*)     All other components within normal limits   URINALYSIS, MICROSCOPIC - Abnormal; Notable for the following  components:    RBCS 4 (*)     WBCS 15 (*)     All other components within normal limits   HCG QUALITATIVE PREGNANCY, SERUM - Normal   CBC WITH DIFF - Normal   LACTIC ACID - FIRST REFLEX - Normal   URINE CULTURE,ROUTINE   CBC/DIFF    Narrative:     The following orders were created for panel order CBC/DIFF.  Procedure                               Abnormality         Status                     ---------                               -----------         ------                     CBC WITH IPQQ[[id number]]                Normal              Final result                 Please view results for these tests on the individual orders.   URINALYSIS, MACROSCOPIC AND MICROSCOPIC W/CULTURE REFLEX    Narrative:     The following orders were created for panel order URINALYSIS, MACROSCOPIC AND MICROSCOPIC W/CULTURE REFLEX [PRN ONLY].  Procedure                               Abnormality         Status                     ---------                               -----------         ------                     URINALYSIS, MACROSCOPIC[[id number]]      Abnormal            Final result                URINALYSIS, MICROSCOPIC[[id number]]      Abnormal            Final result                 Please view results for these tests on the individual orders.     CT ABDOMEN PELVIS W IV CONTRAST   Final Result by Edi, Radresults In (12/28 0618)      No acute inflammatory findings in the abdomen or pelvis.      Moderate colonic stool burden.      Thickening of the endometrium in the fundus of the uterus up to 2.8 cm. Recommend follow-up pelvic ultrasound.      Mild hepatic steatosis.                     Radiologist location ID: [id number]  Medical Decision Making        Medical Decision Making  Blood work unremarkable pregnancy negative CT scan demonstrates constipation.  Patient was advised symptomatic management primary care follow-up and return precautions    Amount and/or Complexity of Data Reviewed  Labs: ordered.  Radiology: ordered.    Risk  Prescription drug management.                Medications Ordered/Administered in the ED   NS flush syringe (0 mL Intracatheter Not Given 05/11/24 0600)   NS flush syringe (has no administration in time range)   NS 250 mL flush bag (has no administration in time range)     And   D5W 250 mL flush bag (has no administration in time range)   NS bolus infusion 1,000 mL (0 mL Intravenous Stopped 05/11/24 0502)   iohexol  (OMNIPAQUE  350) solution (75 mL Intravenous Given 05/11/24 0601)     Clinical Impression   Constipation, unspecified constipation type (Primary)       Disposition: Discharged

## 2024-05-11 NOTE — Discharge Instructions (Signed)
 Increase fluid intake.  Start taking MiraLax (polyethylene glycol) 1 capful daily increasing until you are having bowel movements.  Follow up with the primary care to make sure getting better.  If new or worsening symptoms return to the emergency department

## 2024-05-12 ENCOUNTER — Ambulatory Visit (HOSPITAL_COMMUNITY): Payer: Self-pay

## 2024-05-13 LAB — URINE CULTURE,ROUTINE: URINE CULTURE: 3000 — AB

## 2024-05-15 ENCOUNTER — Emergency Department
Admission: EM | Admit: 2024-05-15 | Discharge: 2024-05-15 | Disposition: A | Discharge status: Home / Self Care | Attending: NURSE PRACTITIONER | Admitting: NURSE PRACTITIONER

## 2024-05-15 ENCOUNTER — Other Ambulatory Visit: Payer: Self-pay

## 2024-05-15 ENCOUNTER — Encounter (HOSPITAL_COMMUNITY): Payer: Self-pay | Admitting: NURSE PRACTITIONER

## 2024-05-15 ENCOUNTER — Emergency Department (HOSPITAL_COMMUNITY)

## 2024-05-15 DIAGNOSIS — R9389 Abnormal findings on diagnostic imaging of other specified body structures: Secondary | ICD-10-CM | POA: Insufficient documentation

## 2024-05-15 DIAGNOSIS — N83202 Unspecified ovarian cyst, left side: Secondary | ICD-10-CM | POA: Insufficient documentation

## 2024-05-15 DIAGNOSIS — N83201 Unspecified ovarian cyst, right side: Secondary | ICD-10-CM | POA: Insufficient documentation

## 2024-05-15 DIAGNOSIS — Z9889 Other specified postprocedural states: Secondary | ICD-10-CM | POA: Insufficient documentation

## 2024-05-15 DIAGNOSIS — Z87440 Personal history of urinary (tract) infections: Secondary | ICD-10-CM | POA: Insufficient documentation

## 2024-05-15 DIAGNOSIS — Z90721 Acquired absence of ovaries, unilateral: Secondary | ICD-10-CM | POA: Insufficient documentation

## 2024-05-15 DIAGNOSIS — N9489 Other specified conditions associated with female genital organs and menstrual cycle: Secondary | ICD-10-CM

## 2024-05-15 DIAGNOSIS — N858 Other specified noninflammatory disorders of uterus: Secondary | ICD-10-CM | POA: Insufficient documentation

## 2024-05-15 DIAGNOSIS — K5730 Diverticulosis of large intestine without perforation or abscess without bleeding: Secondary | ICD-10-CM | POA: Insufficient documentation

## 2024-05-15 DIAGNOSIS — N83209 Unspecified ovarian cyst, unspecified side: Secondary | ICD-10-CM

## 2024-05-15 LAB — COMPREHENSIVE METABOLIC PANEL, NON-FASTING
ALBUMIN/GLOBULIN RATIO: 1.5 — ABNORMAL HIGH (ref 0.8–1.4)
ALBUMIN: 4.9 g/dL (ref 3.5–5.7)
ALKALINE PHOSPHATASE: 67 U/L (ref 34–104)
ALT (SGPT): 23 U/L (ref 7–52)
ANION GAP: 11 mmol/L (ref 4–13)
AST (SGOT): 20 U/L (ref 13–39)
BILIRUBIN TOTAL: 0.4 mg/dL (ref 0.3–1.0)
BUN/CREA RATIO: 10 (ref 6–22)
BUN: 9 mg/dL (ref 7–25)
CALCIUM, CORRECTED: 9 mg/dL (ref 8.9–10.8)
CALCIUM: 9.7 mg/dL (ref 8.6–10.3)
CHLORIDE: 106 mmol/L (ref 98–107)
CO2 TOTAL: 24 mmol/L (ref 21–31)
CREATININE: 0.94 mg/dL (ref 0.60–1.30)
ESTIMATED GFR: 77 mL/min/1.73mˆ2 (ref >59)
GLOBULIN: 3.2 (ref 2.0–3.5)
GLUCOSE: 136 mg/dL — ABNORMAL HIGH (ref 74–109)
OSMOLALITY, CALCULATED: 282 mosm/kg (ref 270–290)
POTASSIUM: 4.2 mmol/L (ref 3.5–5.1)
PROTEIN TOTAL: 8.1 g/dL (ref 6.4–8.9)
SODIUM: 141 mmol/L (ref 136–145)

## 2024-05-15 LAB — CBC WITH DIFF
BASOPHIL #: 0 x10ˆ3/uL (ref 0.00–0.10)
BASOPHIL %: 0 % (ref 0–1)
EOSINOPHIL #: 0 x10ˆ3/uL (ref 0.00–0.50)
EOSINOPHIL %: 0 % — ABNORMAL LOW (ref 1–7)
HCT: 45.8 % — ABNORMAL HIGH (ref 31.2–41.9)
HGB: 15.6 g/dL — ABNORMAL HIGH (ref 10.9–14.3)
LYMPHOCYTE #: 2.3 x10ˆ3/uL (ref 1.10–3.10)
LYMPHOCYTE %: 23 % (ref 16–46)
MCH: 30.4 pg (ref 24.7–32.8)
MCHC: 34 g/dL (ref 32.3–35.6)
MCV: 89.3 fL (ref 75.5–95.3)
MONOCYTE #: 0.6 x10ˆ3/uL (ref 0.20–0.90)
MONOCYTE %: 6 % (ref 4–11)
MPV: 8.4 fL (ref 7.9–10.8)
NEUTROPHIL #: 7.1 x10ˆ3/uL (ref 1.90–8.20)
NEUTROPHIL %: 71 % (ref 43–77)
PLATELETS: 337 x10ˆ3/uL (ref 140–440)
RBC: 5.12 x10ˆ6/uL — ABNORMAL HIGH (ref 3.63–4.92)
RDW: 14.5 % (ref 12.3–17.7)
WBC: 10.1 x10ˆ3/uL (ref 3.8–11.8)

## 2024-05-15 LAB — URINALYSIS, MACROSCOPIC
BILIRUBIN: NEGATIVE mg/dL
BLOOD: NEGATIVE mg/dL
GLUCOSE: NEGATIVE mg/dL
LEUKOCYTES: NEGATIVE WBCs/uL
NITRITE: NEGATIVE
PH: 7 (ref 5.0–9.0)
PROTEIN: 10 mg/dL
SPECIFIC GRAVITY: 1.02 (ref 1.002–1.030)
UROBILINOGEN: NORMAL mg/dL

## 2024-05-15 LAB — LIPASE: LIPASE: 19 U/L (ref 11–82)

## 2024-05-15 LAB — HCG QUALITATIVE PREGNANCY, SERUM: PREGNANCY, SERUM QUALITATIVE: NEGATIVE

## 2024-05-15 LAB — PT/INR
INR: 0.95 (ref 0.84–1.10)
PROTHROMBIN TIME: 10.7 s (ref 9.8–12.7)

## 2024-05-15 LAB — URINALYSIS, MICROSCOPIC
RBCS: <1 /HPF (ref <4)
SQUAMOUS EPITHELIAL: 3 /HPF (ref <28)
WBCS: 3 /HPF (ref <6)

## 2024-05-15 LAB — PTT (PARTIAL THROMBOPLASTIN TIME): APTT: 31.2 s (ref 25.0–38.0)

## 2024-05-15 MED ORDER — ONDANSETRON HCL (PF) 4 MG/2 ML INJECTION SOLUTION
INTRAMUSCULAR | Status: AC
  Filled 2024-05-15: qty 2

## 2024-05-15 MED ORDER — SODIUM CHLORIDE 0.9 % IV BOLUS
30.0000 mL/kg | INJECTION | Status: AC
  Administered 2024-05-15: 3270 mL via INTRAVENOUS
  Administered 2024-05-15: 0 mL via INTRAVENOUS

## 2024-05-15 MED ORDER — MORPHINE 4 MG/ML INJECTION WRAPPER
4.0000 mg | INJECTION | INTRAMUSCULAR | Status: AC
  Administered 2024-05-15: 4 mg via INTRAVENOUS

## 2024-05-15 MED ORDER — MORPHINE 4 MG/ML INJECTION WRAPPER
INJECTION | INTRAMUSCULAR | Status: AC
  Filled 2024-05-15: qty 1

## 2024-05-15 MED ORDER — ONDANSETRON HCL (PF) 4 MG/2 ML INJECTION SOLUTION
4.0000 mg | INTRAMUSCULAR | Status: AC
  Administered 2024-05-15: 4 mg via INTRAVENOUS

## 2024-05-15 MED ORDER — IOHEXOL 350 MG IODINE/ML INTRAVENOUS SOLUTION
100.0000 mL | INTRAVENOUS | Status: AC
  Administered 2024-05-15: 100 mL via INTRAVENOUS

## 2024-05-15 NOTE — ED Nurses Note (Signed)
 Patient discharged home with family.  AVS reviewed with patient/care giver.  A written copy of the AVS and discharge instructions was given to the patient/care giver. IV access discontinued, catheter intact upon removal, bleeding controlled. Questions sufficiently answered as needed.  Patient/care giver encouraged to follow up with PCP as indicated.  In the event of an emergency, patient/care giver instructed to call 911 or go to the nearest emergency room.

## 2024-05-15 NOTE — ED Nurses Note (Signed)
 Patient ambulated out of this ED at this time.l

## 2024-05-15 NOTE — Discharge Instructions (Signed)

## 2024-05-15 NOTE — ED Provider Notes (Signed)
 [facility]  ED Primary Provider Note  Patient Name: Rebecca Sparks  Patient Age: 44 y.o.  Date of Birth: 1980-11-29    Chief Complaint: Abdominal Pain        History of Present Illness       Rebecca Sparks is a 44 y.o. female who had concerns including Abdominal Pain.     History of Present Illness  Rebecca Sparks is a 44 year old female with diverticulosis who presents with lower back pain and blood in stool.    She has been experiencing persistent lower back pain for approximately two weeks, which began on a Saturday. The pain is localized to the lower back area.    She has observed blood streaks in her stool on four occasions, with the most recent occurrence being the most concerning. There is no blood in her urine.    She has a history of diverticulitis and has undergone surgical resection of 33.5 inches of her intestines. She currently has diverticulosis.    No known history of kidney stones, but she has had a severe urinary tract infection in the past.       Review of Systems     No other overt Review of Systems are noted to be positive except noted in the HPI.      Historical Data   History Reviewed This Encounter: Medical History  Surgical History  Family History  Social History      Physical Exam   ED Triage Vitals [05/15/24 1546]   BP (Non-Invasive) (!) 156/97   Heart Rate 78   Respiratory Rate 20   Temperature (!) 35.6 C (96 F)   SpO2 98 %   Weight 109 kg (240 lb)   Height 1.727 m (5' 8)         Nursing notes reviewed for what could be assessed. Past Medical, Surgical, and Social history reviewed for what has been completed.     Constitutional: NAD. Well-Developed. Well Nourished.  Head: Normocephalic, atraumatic.  Mouth/Throat:  Moist mucous membranes.  Eyes: EOM grossly intact, conjunctiva normal.  Neck: Supple  Cardiovascular: Regular Rate and Rhythm, extremities well perfused.  Pulmonary/Chest: No respiratory distress. Lungs are symmetric to auscultation  bilaterally.    Abdominal: Soft, non-tender, non-distended. Non peritoneal, no rebound, no guarding.  MSK: No Lower Extremity Edema.  Skin: Warm, dry, and intact  Neuro: Appropriate, CN II-XII grossly intact. Gait at patient's baseline  Psych: Pleasant             Procedures      Patient Data     Labs Ordered/Reviewed   COMPREHENSIVE METABOLIC PANEL, NON-FASTING - Abnormal; Notable for the following components:       Result Value    GLUCOSE 136 (*)     ALBUMIN/GLOBULIN RATIO 1.5 (*)     All other components within normal limits    Narrative:     Estimated Glomerular Filtration Rate (eGFR) is calculated using the CKD-EPI (2021) equation, intended for patients 39 years of age and older. If gender is not documented or unknown, there will be no eGFR calculation.     CBC WITH DIFF - Abnormal; Notable for the following components:    RBC 5.12 (*)     HGB 15.6 (*)     HCT 45.8 (*)     EOSINOPHIL % 0 (*)     All other components within normal limits   LIPASE - Normal   PTT (PARTIAL THROMBOPLASTIN  TIME) - Normal   PT/INR - Normal    Narrative:     In the setting of warfarin therapy, a moderate-intensity INR goal range is 2.0 to 3.0 and a high-intensity INR goal range is 2.5 to 3.5.    INR is ONLY validated to determine the level of anticoagulation with vitamin K antagonists (warfarin). Other factors may elevate the INR including but not limited to direct oral anticoagulants (DOACs), liver dysfunction, vitamin K deficiency, DIC, factor deficiencies, and factor inhibitors.   URINALYSIS, MACROSCOPIC - Normal   URINALYSIS, MICROSCOPIC - Normal   HCG QUALITATIVE PREGNANCY, SERUM - Normal   OCCULT BLOOD (PRN ED USE ONLY)   CBC/DIFF    Narrative:     The following orders were created for panel order CBC/DIFF.  Procedure                               Abnormality         Status                     ---------                               -----------         ------                     CBC WITH IPQQ[[id number]]                Abnormal             Final result                 Please view results for these tests on the individual orders.   URINALYSIS, MACROSCOPIC AND MICROSCOPIC W/CULTURE REFLEX    Narrative:     The following orders were created for panel order URINALYSIS, MACROSCOPIC AND MICROSCOPIC W/CULTURE REFLEX.  Procedure                               Abnormality         Status                     ---------                               -----------         ------                     URINALYSIS, MACROSCOPIC[[id number]]      Normal              Final result               URINALYSIS, MICROSCOPIC[[id number]]      Normal              Final result                 Please view results for these tests on the individual orders.       US  PELVIS   Final Result by Edi, Radresults In (01/01 2032)      1. Distended endometrium with likely fluid or debris measuring up to 3.1 cm. Endometrial lining is not well evaluated. Short-term follow-up recommended. Consider  GYN evaluation or follow-up nonemergent pelvic MRI for further assessment.   2. Nonvisualization of the right ovary. History of right oophorectomy.            Radiologist location ID: [id number]         CT ABDOMEN PELVIS W IV CONTRAST   Final Result by Edi, Radresults In (01/01 1859)      1. No evidence of urinary or bowel obstruction. Colonic diverticulosis.   2. Pronounced endometrial thickening is not well assessed by CT. Correlation with ultrasound follow-up.               Radiologist location ID: [facility]             Medical Decision Making          Medical Decision Making  Amount and/or Complexity of Data Reviewed  Labs: ordered.  Radiology: ordered. Decision-making details documented in ED Course.    Risk  Prescription drug management.  Parenteral controlled substances.          Studies Assessed:  Labs imaging      MDM Narrative:      Medical Decision Making  A 44 year old female with a history of diverticulosis and prior bowel resection presented with two weeks of lower back pain and  blood streaks in her stool, as well as recent constipation. On examination, she had tenderness in the left lower abdomen. She denied hematuria and reported no history of kidney stones, but has had a prior urinary tract infection.    Patient's workup did reveal uterine cyst as well as some ovarian cysts and a thickened endometrium I did informed patient of the findings and that she needs to follow up with her OBGYN.  Patient verbalized understanding.  Patient did receive pain medicine in our emergency department today and did have significant relief.  Patient was discharged.    Differential diagnosis includes, but is not limited to:  - Diverticulitis: Diverticulitis was considered due to left lower abdominal tenderness, history of diverticulosis, and blood streaks in stool.  - Nephrolithiasis: Nephrolithiasis was considered given lower back pain, but absence of hematuria and no prior history make it less likely.    Lower gastrointestinal bleeding and lower back pain in the setting of diverticulosis  - Order laboratory studies to evaluate for diverticulitis and nephrolithiasis  - Administer pain medication for symptomatic relief  - Administer topical blood cream for symptomatic relief    Constipation  - Administer medication for constipation relief                 Results                ED Course as of 05/15/24 2045   Thu May 15, 2024   1906 CT ABDOMEN PELVIS W IV CONTRAST  IMPRESSION:     1. No evidence of urinary or bowel obstruction. Colonic diverticulosis.  2. Pronounced endometrial thickening is not well assessed by CT. Correlation with ultrasound follow-up.     2038 US  PELVIS  IMPRESSION:     1.Distended endometrium with likely fluid or debris measuring up to 3.1 cm. Endometrial lining is not well evaluated. Short-term follow-up recommended. Consider GYN evaluation or follow-up nonemergent pelvic MRI for further assessment.  2.Nonvisualization of the right ovary. History of right oophorectomy.     2043 Patient  states she feels significantly better.  I did inform her of all the results today.  Informed patient she needs to follow up with her OBGYN.  Patient verbalized  understanding.  Patient was discharged.         Medications Ordered/Administered in the ED   NS bolus infusion 30 mL/kg = 3,270 mL (0 mL Intravenous Stopped 05/15/24 1845)   morphine  4 mg/mL injection (4 mg Intravenous Given 05/15/24 1652)   ondansetron  (ZOFRAN ) 2 mg/mL injection (4 mg Intravenous Given 05/15/24 1652)   iohexol  (OMNIPAQUE  350) solution (100 mL Intravenous Given 05/15/24 1807)       Following the history, physical exam, and ED workup, the patient was deemed stable and suitable for discharge. The patient/caregiver was advised to return to the ED for any new or worsening symptoms. Discharge medications, and follow-up instructions were discussed with the patient/caregiver in detail, who verbalizes understanding. The patient/caregiver is in agreement and is comfortable with the plan of care.    Disposition: Discharged         Current Discharge Medication List        CONTINUE these medications - NO CHANGES were made during your visit.        Details   Abilify Maintena 400 mg suspension,extended rel syring syringe  Generic drug: ARIPiprazole   400 mg, IntraMUSCULAR, EVERY 30 DAYS  Refills: 0     fluconazole  150 mg Tablet  Commonly known as: DIFLUCAN    Take 1 tablet today, then repeat in 3 days if needed  Qty: 2 Tablet  Refills: 0     fluticasone  propionate 50 mcg/actuation Spray, Suspension  Commonly known as: FLONASE    2 Sprays, Each Nostril, Daily  Qty: 16 g  Refills: 0     guaiFENesin  600 mg Tablet Extended Release 12hr  Commonly known as: MUCINEX    600 mg, Oral, EVERY 12 HOURS  Refills: 0     ipratropium bromide 42 mcg (0.06 %) Spray, Non-Aerosol  Commonly known as: ATROVENT   2 Sprays, 3 TIMES DAILY  Refills: 0     meclizine 25 mg Tablet  Commonly known as: ANTIVERT   1 Tablet, Oral, 3 TIMES DAILY PRN  Refills: 0     metFORMIN 500 mg Tablet Sustained  Release 24 hr  Commonly known as: GLUCOPHAGE XR   1 Tablet, 3 TIMES DAILY WITH MEALS  Refills: 0     naproxen  500 mg Tablet  Commonly known as: NAPROSYN    500 mg, Oral, 2 TIMES DAILY WITH FOOD  Qty: 30 Tablet  Refills: 0     * ondansetron  4 mg Tablet, Rapid Dissolve  Commonly known as: ZOFRAN  ODT   4 mg, Oral, EVERY 8 HOURS PRN  Qty: 12 Tablet  Refills: 0     * ondansetron  4 mg Tablet, Rapid Dissolve  Commonly known as: ZOFRAN  ODT   4 mg, Oral, EVERY 8 HOURS PRN  Qty: 12 Tablet  Refills: 0           * This list has 2 medication(s) that are the same as other medications prescribed for you. Read the directions carefully, and ask your doctor or other care provider to review them with you.                Follow up:   Adeline Penne HERO, DO  [address]  [address]  [place] [address]  [phone]                Clinical Impression   Thickened endometrium (Primary)   Uterine cyst   Cyst of ovary, unspecified laterality         Current Discharge Medication List  MYRTIS Elbe FNP-C  Department of Emergency Medicine

## 2024-05-15 NOTE — ED Triage Notes (Signed)
 ABD PAIN, N/V X1 WEEK. SEEN LAST WEEK FOR SAME COMPLAINT. PT HAS HX OF COLON REMOVAL FOR COLITIS. ACCUCHECK 120.

## 2024-06-05 ENCOUNTER — Emergency Department
Admission: EM | Admit: 2024-06-05 | Discharge: 2024-06-06 | Disposition: A | Discharge status: Home / Self Care | Attending: NURSE PRACTITIONER | Admitting: NURSE PRACTITIONER

## 2024-06-05 ENCOUNTER — Emergency Department (HOSPITAL_COMMUNITY)

## 2024-06-05 ENCOUNTER — Other Ambulatory Visit: Payer: Self-pay

## 2024-06-05 DIAGNOSIS — R1032 Left lower quadrant pain: Secondary | ICD-10-CM

## 2024-06-05 DIAGNOSIS — E282 Polycystic ovarian syndrome: Secondary | ICD-10-CM | POA: Insufficient documentation

## 2024-06-05 DIAGNOSIS — R1031 Right lower quadrant pain: Secondary | ICD-10-CM

## 2024-06-05 DIAGNOSIS — Z90721 Acquired absence of ovaries, unilateral: Secondary | ICD-10-CM | POA: Insufficient documentation

## 2024-06-05 DIAGNOSIS — Z8742 Personal history of other diseases of the female genital tract: Secondary | ICD-10-CM

## 2024-06-05 DIAGNOSIS — R160 Hepatomegaly, not elsewhere classified: Secondary | ICD-10-CM | POA: Insufficient documentation

## 2024-06-05 DIAGNOSIS — R102 Pelvic and perineal pain unspecified side: Secondary | ICD-10-CM

## 2024-06-05 DIAGNOSIS — Z9889 Other specified postprocedural states: Secondary | ICD-10-CM | POA: Insufficient documentation

## 2024-06-05 DIAGNOSIS — K760 Fatty (change of) liver, not elsewhere classified: Secondary | ICD-10-CM | POA: Insufficient documentation

## 2024-06-05 DIAGNOSIS — R1023 Pelvic and perineal pain bilateral: Secondary | ICD-10-CM | POA: Insufficient documentation

## 2024-06-05 DIAGNOSIS — K5730 Diverticulosis of large intestine without perforation or abscess without bleeding: Secondary | ICD-10-CM | POA: Insufficient documentation

## 2024-06-05 DIAGNOSIS — Z9049 Acquired absence of other specified parts of digestive tract: Secondary | ICD-10-CM | POA: Insufficient documentation

## 2024-06-05 LAB — CBC WITH DIFF
BASOPHIL #: 0.1 x10ˆ3/uL (ref 0.00–0.10)
BASOPHIL %: 1 % (ref 0–1)
EOSINOPHIL #: 0 x10ˆ3/uL (ref 0.00–0.50)
EOSINOPHIL %: 0 % — ABNORMAL LOW (ref 1–7)
HCT: 46 % — ABNORMAL HIGH (ref 31.2–41.9)
HGB: 15.6 g/dL — ABNORMAL HIGH (ref 10.9–14.3)
LYMPHOCYTE #: 3.1 x10ˆ3/uL (ref 1.10–3.10)
LYMPHOCYTE %: 31 % (ref 16–46)
MCH: 30.3 pg (ref 24.7–32.8)
MCHC: 34 g/dL (ref 32.3–35.6)
MCV: 89 fL (ref 75.5–95.3)
MONOCYTE #: 0.5 x10ˆ3/uL (ref 0.20–0.90)
MONOCYTE %: 5 % (ref 4–11)
MPV: 8.2 fL (ref 7.9–10.8)
NEUTROPHIL #: 6.5 x10ˆ3/uL (ref 1.90–8.20)
NEUTROPHIL %: 63 % (ref 43–77)
PLATELETS: 335 x10ˆ3/uL (ref 140–440)
RBC: 5.17 x10ˆ6/uL — ABNORMAL HIGH (ref 3.63–4.92)
RDW: 14.3 % (ref 12.3–17.7)
WBC: 10.2 x10ˆ3/uL (ref 3.8–11.8)

## 2024-06-05 LAB — LIPASE: LIPASE: 26 U/L (ref 11–82)

## 2024-06-05 LAB — COMPREHENSIVE METABOLIC PANEL, NON-FASTING
ALBUMIN/GLOBULIN RATIO: 1.6 — ABNORMAL HIGH (ref 0.8–1.4)
ALBUMIN: 5.1 g/dL (ref 3.5–5.7)
ALKALINE PHOSPHATASE: 69 U/L (ref 34–104)
ALT (SGPT): 18 U/L (ref 7–52)
ANION GAP: 12 mmol/L (ref 4–13)
AST (SGOT): 19 U/L (ref 13–39)
BILIRUBIN TOTAL: 0.5 mg/dL (ref 0.3–1.0)
BUN/CREA RATIO: 9 (ref 6–22)
BUN: 8 mg/dL (ref 7–25)
CALCIUM, CORRECTED: 8.8 mg/dL — ABNORMAL LOW (ref 8.9–10.8)
CALCIUM: 9.7 mg/dL (ref 8.6–10.3)
CHLORIDE: 105 mmol/L (ref 98–107)
CO2 TOTAL: 25 mmol/L (ref 21–31)
CREATININE: 0.91 mg/dL (ref 0.60–1.30)
ESTIMATED GFR: 80 mL/min/1.73mˆ2 (ref >59)
GLOBULIN: 3.1 (ref 2.0–3.5)
GLUCOSE: 117 mg/dL — ABNORMAL HIGH (ref 74–109)
OSMOLALITY, CALCULATED: 283 mosm/kg (ref 270–290)
POTASSIUM: 3.8 mmol/L (ref 3.5–5.1)
PROTEIN TOTAL: 8.2 g/dL (ref 6.4–8.9)
SODIUM: 142 mmol/L (ref 136–145)

## 2024-06-05 LAB — HCG, PLASMA OR SERUM QUANTITATIVE: HCG, QUANTITATIVE: <1 m[IU]/mL (ref <5.00)

## 2024-06-05 MED ORDER — KETOROLAC 30 MG/ML (1 ML) INJECTION SOLUTION
30.0000 mg | INTRAMUSCULAR | Status: AC
  Administered 2024-06-05: 30 mg via INTRAVENOUS

## 2024-06-05 MED ORDER — KETOROLAC 30 MG/ML (1 ML) INJECTION SOLUTION
INTRAMUSCULAR | Status: AC
  Filled 2024-06-05: qty 1

## 2024-06-05 MED ORDER — ONDANSETRON HCL (PF) 4 MG/2 ML INJECTION SOLUTION
4.0000 mg | INTRAMUSCULAR | Status: AC
  Administered 2024-06-05: 4 mg via INTRAVENOUS

## 2024-06-05 MED ORDER — IOPAMIDOL 370 MG IODINE/ML (76 %) INTRAVENOUS SOLUTION
75.0000 mL | INTRAVENOUS | Status: AC
  Administered 2024-06-05: 75 mL via INTRAVENOUS

## 2024-06-05 NOTE — ED Triage Notes (Signed)
 Last void four hours ago with lower abd pain for a couple of hours, hx of PCOS, no relief with metformin, to have a hysterectomy on 07-01-24 by Dr Adeline

## 2024-06-05 NOTE — ED Provider Notes (Incomplete)
 Emergency Medicine      Name: Rebecca Sparks  Age and Gender: 44 y.o. female  Date of Birth: 1981/03/16  MRN: [id number]  PCP: Elouise Stabs, APRN, CNP    CC:  Chief Complaint   Patient presents with    Urinary Retention    Abdominal Pain    Nausea       HPI:  Rebecca Sparks is a 44 y.o. 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  The patient reports bilateral lower abdominal pain with associated low back pain that began earlier today and has progressively worsened.  She describes the pain as an aching sensation with intermittent sharp cramping episodes.  Pain fluctuates in intensity and is exacerbated by any movement.  She reports no relieving factors and states she has tried over-the-counter analgesics which have provided no relief.  The patient notes chronic pelvic pain related to PCOS and reports she is scheduled for a total hysterectomy in the future, however, she states today's pain is significantly more severe than her baseline.  She also reports decreased urine output, with last urination approximately 4 hours ago prior to arrival, prompting her visit.  She denies dysuria, hematuria, or urinary frequency.  Associated symptoms include nausea without vomiting.  She denies diarrhea, fever, or chills.  She is unable to recall her last bowel movement and denies rectal bleeding or hematochezia.  She reports no vaginal bleeding or discharge.  She has a history of endometrial ablation and does not have regular menses.    Below pertinent information reviewed with patient:  Past Medical History:   Diagnosis Date    Bipolar disorder, unspecified     Chronic obstructive airway disease     Schizophrenia            Allergies[1]    Past Surgical History:   Procedure Laterality Date    COLON SURGERY      pt states they  removed approximatley 33 inches of instestines    ENDOMETRIAL ABLATION      HX TUBAL LIGATION      LAPAROSCOPIC RIGHT SALPINGO-OOPHORECTOMY N/A 02/20/2023    Performed by Adeline Penne HERO, DO at [facility]    NECK SURGERY          Social History     Socioeconomic History    Marital status: Single   Tobacco Use    Smoking status: Never    Smokeless tobacco: Never   Vaping Use    Vaping status: Every Day   Substance and Sexual Activity    Alcohol use: Never    Drug use: Yes     Frequency: 2.0 times per week     Types: Marijuana     Comment: last use- last night at 1900       ROS:  No other overt positive review of systems are noted other than stated in the HPI.      Objective:    ED Triage Vitals [06/05/24 1932]   BP (Non-Invasive) (!) 120/90   Heart Rate 66   Respiratory Rate 18   Temperature 36.7 C (98 F)   SpO2 96 %   Weight 108 kg (238 lb)   Height 1.727 m (5' 8)     Filed Vitals:    06/05/24 1932   BP: (!) 120/90  Pulse: 66   Resp: 18   Temp: 36.7 C (98 F)   SpO2: 96%       Nursing notes and vital signs reviewed.    Constitutional - No acute distress.  Alert and Active.  HEENT - Normocephalic. Atraumatic. PERRL. EOMI. Conjunctiva clear. TM's pearly grey, translucent, without bulging or retraction. Oropharynx with no erythema, lesions, or exudates. Moist mucous membranes.   Neck - Trachea midline. No stridor. No hoarseness.  Cardiac - Regular rate and rhythm. No murmurs, rubs, or gallops. Intact distal pulses.  Respiratory/Chest - Normal respiratory effort. Clear to auscultation bilaterally. No rales, wheezes or rhonchi. No chest tenderness.  Abdomen - Normal bowel sounds. Non-tender, soft, non-distended. No rebound or guarding.   Musculoskeletal - Good AROM. No muscle or joint tenderness appreciated. No clubbing, cyanosis or edema.  Skin - Warm and dry, without any rashes or other lesions.  Neuro - Alert and oriented x 3. Cranial nerves II-XII are grossly intact.  Moving all extremities  symmetrically. Normal gait.  Psych - Normal mood and affect. Behavior is normal         Any pertinent labs and imaging obtained during this encounter reviewed below in MDM.    MDM/ED Course:    ***    Medical Decision Making      Workup:     Results for orders placed or performed during the hospital encounter of 06/05/24 (from the past 24 hours)   URINALYSIS, MACROSCOPIC AND MICROSCOPIC W/CULTURE REFLEX    Collection Time: 06/05/24  7:35 PM    Specimen: Urine, Site not specified    Narrative    The following orders were created for panel order URINALYSIS, MACROSCOPIC AND MICROSCOPIC W/CULTURE REFLEX.  Procedure                               Abnormality         Status                     ---------                               -----------         ------                     URINALYSIS, MACROSCOPIC[[id number]]                                                     URINALYSIS, MICROSCOPIC[[id number]]                                                       Please view results for these tests on the individual orders.   CBC/DIFF    Collection Time: 06/05/24  7:55 PM    Narrative    The following orders were created for panel order CBC/DIFF.  Procedure                               Abnormality  Status                     ---------                               -----------         ------                     CBC WITH IPQQ[[id number]]                Abnormal            Final result                 Please view results for these tests on the individual orders.   CBC WITH DIFF    Collection Time: 06/05/24  7:55 PM   Result Value Ref Range    WBC 10.2 3.8 - 11.8 x103/uL    RBC 5.17 (H) 3.63 - 4.92 x106/uL    HGB 15.6 (H) 10.9 - 14.3 g/dL    HCT 53.9 (H) 68.7 - 41.9 %    MCV 89.0 75.5 - 95.3 fL    MCH 30.3 24.7 - 32.8 pg    MCHC 34.0 32.3 - 35.6 g/dL    RDW 85.6 87.6 - 82.2 %    PLATELETS 335 140 - 440 x103/uL    MPV 8.2 7.9 - 10.8 fL    NEUTROPHIL % 63 43 - 77 %    LYMPHOCYTE % 31 16 - 46 %    MONOCYTE % 5 4 - 11 %     EOSINOPHIL % 0 (L) 1 - 7 %    BASOPHIL % 1 0 - 1 %    NEUTROPHIL # 6.50 1.90 - 8.20 x103/uL    LYMPHOCYTE # 3.10 1.10 - 3.10 x103/uL    MONOCYTE # 0.50 0.20 - 0.90 x103/uL    EOSINOPHIL # 0.00 0.00 - 0.50 x103/uL    BASOPHIL # 0.10 0.00 - 0.10 x103/uL            Orders Placed This Encounter    CT ABDOMEN PELVIS W IV CONTRAST    CBC/DIFF    COMPREHENSIVE METABOLIC PANEL, NON-FASTING    HCG, PLASMA OR SERUM QUANTITATIVE, PREGNANCY    URINALYSIS, MACROSCOPIC AND MICROSCOPIC W/CULTURE REFLEX    CBC WITH DIFF    URINALYSIS, MACROSCOPIC    URINALYSIS, MICROSCOPIC    LIPASE       ECG: ***    Abnormal Lab results:  Labs Ordered/Reviewed   CBC WITH DIFF - Abnormal; Notable for the following components:       Result Value    RBC 5.17 (*)     HGB 15.6 (*)     HCT 46.0 (*)     EOSINOPHIL % 0 (*)     All other components within normal limits   CBC/DIFF    Narrative:     The following orders were created for panel order CBC/DIFF.  Procedure                               Abnormality         Status                     ---------                               -----------         ------  CBC WITH IPQQ[[id number]]                Abnormal            Final result                 Please view results for these tests on the individual orders.   COMPREHENSIVE METABOLIC PANEL, NON-FASTING   HCG, PLASMA OR SERUM QUANTITATIVE   URINALYSIS, MACROSCOPIC AND MICROSCOPIC W/CULTURE REFLEX    Narrative:     The following orders were created for panel order URINALYSIS, MACROSCOPIC AND MICROSCOPIC W/CULTURE REFLEX.  Procedure                               Abnormality         Status                     ---------                               -----------         ------                     URINALYSIS, MACROSCOPIC[[id number]]                                                     URINALYSIS, MICROSCOPIC[[id number]]                                                       Please view results for these tests on the individual orders.    URINALYSIS, MACROSCOPIC   URINALYSIS, MICROSCOPIC   LIPASE         Critical care:   Critical Care Attestation:  As the ED {provider type:48533}, I have provided critical care for this patient.  This patient has high probability of imminent life or limb threatening deterioration due to [facility] CRITICALLY POO:[id number]}.  I provided {INTERVENTIONS:48529::direct patient care,documentation of findings,review of medical records} and frequent reassessment.  Time spent providing critical care (exclusive of any billed procedures and/or teaching): *** minutes.         Orders Placed This Encounter    CT ABDOMEN PELVIS W IV CONTRAST    CBC/DIFF    COMPREHENSIVE METABOLIC PANEL, NON-FASTING    HCG, PLASMA OR SERUM QUANTITATIVE, PREGNANCY    URINALYSIS, MACROSCOPIC AND MICROSCOPIC W/CULTURE REFLEX    CBC WITH DIFF    URINALYSIS, MACROSCOPIC    URINALYSIS, MICROSCOPIC    LIPASE         Any procedures:  Procedures    Impression:   Clinical Impression   None       Disposition: Data Unavailable    / Eleanor Mackie FNP-C  06/05/2024, 20:13  [facility], /[facility]  [place]  [place]    Portions of this note may have been dictated using voice recognition software.     -----------------------  Results for orders placed or performed during the hospital encounter of 06/05/24 (from the past 12 hours)   CBC WITH DIFF  Result Value Ref Range    WBC 10.2 3.8 - 11.8 x103/uL    RBC 5.17 (H) 3.63 - 4.92 x106/uL    HGB 15.6 (H) 10.9 - 14.3 g/dL    HCT 53.9 (H) 68.7 - 41.9 %    MCV 89.0 75.5 - 95.3 fL    MCH 30.3 24.7 - 32.8 pg    MCHC 34.0 32.3 - 35.6 g/dL    RDW 85.6 87.6 - 82.2 %    PLATELETS 335 140 - 440 x103/uL    MPV 8.2 7.9 - 10.8 fL    NEUTROPHIL % 63 43 - 77 %    LYMPHOCYTE % 31 16 - 46 %    MONOCYTE % 5 4 - 11 %    EOSINOPHIL % 0 (L) 1 - 7 %    BASOPHIL % 1 0 - 1 %    NEUTROPHIL # 6.50 1.90 - 8.20 x103/uL    LYMPHOCYTE # 3.10 1.10 - 3.10 x103/uL     MONOCYTE # 0.50 0.20 - 0.90 x103/uL    EOSINOPHIL # 0.00 0.00 - 0.50 x103/uL    BASOPHIL # 0.10 0.00 - 0.10 x103/uL     No orders to display                [1]  No Known Allergies

## 2024-06-05 NOTE — ED Provider Notes (Signed)
 Emergency Medicine      Name: Rebecca Sparks  Age and Gender: 44 y.o. female  Date of Birth: August 03, 1980  MRN: [id number]  PCP: Elouise Stabs, APRN, CNP    CC:  Chief Complaint   Patient presents with    Urinary Retention    Abdominal Pain    Nausea       HPI:  Rebecca Sparks is a 44 y.o. 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  The patient reports bilateral lower abdominal pain with associated low back pain that began earlier today and has progressively worsened.  She describes the pain as an aching sensation with intermittent sharp cramping episodes.  Pain fluctuates in intensity and is exacerbated by any movement.  She reports no relieving factors and states she has tried over-the-counter analgesics which have provided no relief.  The patient notes chronic pelvic pain related to PCOS and reports she is scheduled for a total hysterectomy in the future, however, she states today's pain is significantly more severe than her baseline.  She also reports decreased urine output, with last urination approximately 4 hours ago prior to arrival, prompting her visit.  She denies dysuria, hematuria, or urinary frequency.  Associated symptoms include nausea without vomiting.  She denies diarrhea, fever, or chills.  She is unable to recall her last bowel movement and denies rectal bleeding or hematochezia.  She reports no vaginal bleeding or discharge.  She has a history of endometrial ablation and does not have regular menses.    Below pertinent information reviewed with patient:  Past Medical History:   Diagnosis Date    Bipolar disorder, unspecified     Chronic obstructive airway disease     Schizophrenia            Allergies[1]    Past Surgical History:   Procedure Laterality Date    COLON SURGERY      pt states they removed  approximatley 33 inches of instestines    ENDOMETRIAL ABLATION      HX TUBAL LIGATION      LAPAROSCOPIC RIGHT SALPINGO-OOPHORECTOMY N/A 02/20/2023    Performed by Adeline Penne HERO, DO at [facility]    NECK SURGERY          Social History     Socioeconomic History    Marital status: Single   Tobacco Use    Smoking status: Never    Smokeless tobacco: Never   Vaping Use    Vaping status: Every Day   Substance and Sexual Activity    Alcohol use: Never    Drug use: Yes     Frequency: 2.0 times per week     Types: Marijuana     Comment: last use- last night at 1900       ROS:  No other overt positive review of systems are noted other than stated in the HPI.      Objective:    ED Triage Vitals [06/05/24 1932]   BP (Non-Invasive) (!) 120/90   Heart Rate 66   Respiratory Rate 18   Temperature 36.7 C (98 F)   SpO2 96 %   Weight 108 kg (238 lb)   Height 1.727 m (5' 8)     Filed Vitals:    06/05/24 1932   BP: (!) 120/90  Pulse: 66   Resp: 18   Temp: 36.7 C (98 F)   SpO2: 96%       Nursing notes and vital signs reviewed.    Constitutional - No acute distress.  Alert and Active.  HEENT - Normocephalic. Atraumatic. PERRL. EOMI. Conjunctiva clear. Moist mucous membranes.   Neck - Trachea midline. No stridor. No hoarseness.  Cardiac - Regular rate and rhythm. No murmurs, rubs, or gallops. Intact distal pulses.  Respiratory/Chest - Normal respiratory effort. Clear to auscultation bilaterally. No rales, wheezes or rhonchi.   Abdomen - Normal bowel sounds. Lower abdominal tenderness. No rebound. No guarding. Scar noted at umbilicus.   Musculoskeletal - Good AROM. No muscle or joint tenderness appreciated. No clubbing, cyanosis or edema.  Skin - Warm and dry, without any rashes or other lesions.  Neuro - Alert and oriented x 3. Cranial nerves II-XII are grossly intact.  Moving all extremities symmetrically. Normal gait.  Psych - Normal mood and affect. Behavior is normal         Any pertinent labs and imaging obtained  during this encounter reviewed below in MDM.    MDM/ED Course:    44 year old female with history of PCOS, left oophorectomy, prior diverticulitis status post partial colectomy, chronic pelvic pain presented with acute worsening bilateral lower abdominal pain and decreased urine output.  Given her surgical history and changed from baseline pain, brought evaluation was pursued to assess for intra-abdominal, gynecologic, urinary, metabolic etiologies.  Laboratory evaluation including CBC, CMP, urinalysis, lipase, serum hCG were obtained and unremarkable.  CT of abdomen and pelvis demonstrated no acute intra-abdominal or pelvic pathology.  Patient was treated symptomatically with ketorolac  and Odansetron with significant improvement in pain and nausea.  Patient was able to void spontaneously ED.  Postvoid bladder scan showed approximately 50 cc residual., Not consistent with urinary retention.  Findings and results were reviewed with patient in detail.  Given improvement in symptoms, negative workup, and stable vital signs, no indication for emergent surgery patient intervention was identified at this time.  Presentation is most consistent with acute exacerbation of chronic pelvic pain.    Discharge/Plan  -- Prescribed ibuprofen  800 mg p.o. t.i.d. with food as needed for pain.  -- Prescribed promethazine  25 mg p.o. q.6 hours as needed for nausea  -- Advised to continue close follow up with OBGYN as scheduled  -- Strict return precautions discussed including worsening abdominal or pelvic pain, inability tolerate oral intake, vomiting, fever, urinary retention, new vaginal bleeding or discharge, or any other new or concerning symptoms.  Patient verbalized understanding of discharge instructions and agrees with plan.    Medical Decision Making      Workup:     Results for orders placed or performed during the hospital encounter of 06/05/24 (from the past 24 hours)   CBC/DIFF    Collection Time: 06/05/24  7:55 PM     Narrative    The following orders were created for panel order CBC/DIFF.  Procedure                               Abnormality         Status                     ---------                               -----------         ------  CBC WITH IPQQ[[id number]]                Abnormal            Final result                 Please view results for these tests on the individual orders.   COMPREHENSIVE METABOLIC PANEL, NON-FASTING    Collection Time: 06/05/24  7:55 PM   Result Value Ref Range    SODIUM 142 136 - 145 mmol/L    POTASSIUM 3.8 3.5 - 5.1 mmol/L    CHLORIDE 105 98 - 107 mmol/L    CO2 TOTAL 25 21 - 31 mmol/L    ANION GAP 12 4 - 13 mmol/L    BUN 8 7 - 25 mg/dL    CREATININE 9.08 9.39 - 1.30 mg/dL    BUN/CREA RATIO 9 6 - 22    ESTIMATED GFR 80 >59 mL/min/1.49m2    ALBUMIN 5.1 3.5 - 5.7 g/dL    CALCIUM 9.7 8.6 - 89.6 mg/dL    GLUCOSE 882 (H) 74 - 109 mg/dL    ALKALINE PHOSPHATASE 69 34 - 104 U/L    ALT (SGPT) 18 7 - 52 U/L    AST (SGOT) 19 13 - 39 U/L    BILIRUBIN TOTAL 0.5 0.3 - 1.0 mg/dL    PROTEIN TOTAL 8.2 6.4 - 8.9 g/dL    ALBUMIN/GLOBULIN RATIO 1.6 (H) 0.8 - 1.4    OSMOLALITY, CALCULATED 283 270 - 290 mOsm/kg    CALCIUM, CORRECTED 8.8 (L) 8.9 - 10.8 mg/dL    GLOBULIN 3.1 2.0 - 3.5    Narrative    Estimated Glomerular Filtration Rate (eGFR) is calculated using the CKD-EPI (2021) equation, intended for patients 72 years of age and older. If gender is not documented or unknown, there will be no eGFR calculation.     HCG, PLASMA OR SERUM QUANTITATIVE, PREGNANCY    Collection Time: 06/05/24  7:55 PM   Result Value Ref Range    HCG, QUANTITATIVE <1.00 <5.00 mIU/mL   CBC WITH DIFF    Collection Time: 06/05/24  7:55 PM   Result Value Ref Range    WBC 10.2 3.8 - 11.8 x103/uL    RBC 5.17 (H) 3.63 - 4.92 x106/uL    HGB 15.6 (H) 10.9 - 14.3 g/dL    HCT 53.9 (H) 68.7 - 41.9 %    MCV 89.0 75.5 - 95.3 fL    MCH 30.3 24.7 - 32.8 pg    MCHC 34.0 32.3 - 35.6 g/dL    RDW 85.6 87.6 - 82.2 %    PLATELETS 335  140 - 440 x103/uL    MPV 8.2 7.9 - 10.8 fL    NEUTROPHIL % 63 43 - 77 %    LYMPHOCYTE % 31 16 - 46 %    MONOCYTE % 5 4 - 11 %    EOSINOPHIL % 0 (L) 1 - 7 %    BASOPHIL % 1 0 - 1 %    NEUTROPHIL # 6.50 1.90 - 8.20 x103/uL    LYMPHOCYTE # 3.10 1.10 - 3.10 x103/uL    MONOCYTE # 0.50 0.20 - 0.90 x103/uL    EOSINOPHIL # 0.00 0.00 - 0.50 x103/uL    BASOPHIL # 0.10 0.00 - 0.10 x103/uL   LIPASE    Collection Time: 06/05/24  7:55 PM   Result Value Ref Range    LIPASE 26 11 - 82 U/L   URINALYSIS, MACROSCOPIC AND MICROSCOPIC  W/CULTURE REFLEX    Collection Time: 06/05/24 11:40 PM    Specimen: Urine, Site not specified    Narrative    The following orders were created for panel order URINALYSIS, MACROSCOPIC AND MICROSCOPIC W/CULTURE REFLEX.  Procedure                               Abnormality         Status                     ---------                               -----------         ------                     URINALYSIS, MACROSCOPIC[[id number]]      Normal              Final result               URINALYSIS, MICROSCOPIC[[id number]]      Abnormal            Final result                 Please view results for these tests on the individual orders.   URINALYSIS, MACROSCOPIC    Collection Time: 06/05/24 11:40 PM   Result Value Ref Range    APPEARANCE Clear Clear    COLOR Yellow Yellow, Colorless    PH 6.0 5.0 - 8.0    SPECIFIC GRAVITY 1.020 1.005 - 1.030    GLUCOSE Negative Negative mg/dL    BILIRUBIN Negative Negative mg/dL    BLOOD Negative Negative mg/dL    PROTEIN Negative Negative mg/dL    UROBILINOGEN Negative Negative, 2  mg/dL    KETONES Negative Negative mg/dL    LEUKOCYTE ESTERASE Negative Negative WBCs/uL    NITRITE Negative Negative   URINALYSIS, MICROSCOPIC    Collection Time: 06/05/24 11:40 PM   Result Value Ref Range    WBCS 6-10 (A) None, 1-5 /hpf    RBCS 1-5 None, 1-5 /hpf    BACTERIA None None, Rare /hpf    SQUAMOUS EPITHELIAL Few (A) None, Rare /hpf    MUCOUS Light /hpf       Results for orders placed or  performed during the hospital encounter of 06/05/24 (from the past 72 hours)   CT ABDOMEN PELVIS W IV CONTRAST     Status: None    Narrative    Rebecca Sparks    RADIOLOGISTBETHA OLIVA NOTTINGHAM, MD    CT ABDOMEN PELVIS W IV CONTRAST performed on 06/05/2024 10:07 PM    CLINICAL HISTORY: Abdominal pain  LOWER ABD PAIN, PELVIC PAIN, BACK PAIN, NAUSEA, RT SALPINGOOPRHECTOMY, REMOVED 33INCHES OF COLON PER PT    TECHNIQUE:  Abdomen and pelvis CT with intravenous contrast.  IV CONTRAST: 75 ml's of Isovue  370    COMPARISON:  CT abdomen pelvis 05/15/2024.    FINDINGS:  Lung bases: Clear    Liver:   Enlarged at 19 cm in length and low attenuating representing fatty infiltration.    Gallbladder:   Unremarkable.    Spleen:   Unremarkable.    Pancreas:   Unremarkable.    Adrenals:   Unremarkable.    Kidneys:   The kidneys enhance symmetrically. There are small  bilateral renal cysts. No calculi or hydronephrosis. The ureters are unobstructed.    Bladder:  Partially distended with fluid.    Uterus and Adnexa:  The uterus is anteverted. There is fluid within the endometrial canal, may relate to patient's menstrual status. Small amount of free pelvic fluid, likely physiologic.    Bowel:   No evidence of bowel obstruction. Appears to be postsurgical changes of partial small bowel resection with anastomosis in the right lower quadrant. Additional surgical changes of distal colonic resection with anastomosis in the rectosigmoid region. There is colonic diverticulosis.    Appendix:  The appendix is not identified.  There is no inflammatory process identified in the right lower quadrant to suggest appendicitis.    Lymph nodes:  No suspicious lymph node enlargement.    Vasculature:   Major vascular structures are unremarkable.     Peritoneum / Retroperitoneum: No free air.    Bones:   There is a fat-containing umbilical hernia. The imaged osseous structures are without acute findings.        Impression    Postsurgical changes of partial small  bowel and distal colonic resection. No evidence of bowel obstruction. Colonic diverticulosis, currently without evidence of acute diverticulitis.    Hepatomegaly and steatosis.    Small amount of free pelvic fluid, likely physiologic.          Radiologist location ID: [id number]         Orders Placed This Encounter    CT ABDOMEN PELVIS W IV CONTRAST    CBC/DIFF    COMPREHENSIVE METABOLIC PANEL, NON-FASTING    HCG, PLASMA OR SERUM QUANTITATIVE, PREGNANCY    URINALYSIS, MACROSCOPIC AND MICROSCOPIC W/CULTURE REFLEX    CBC WITH DIFF    URINALYSIS, MACROSCOPIC    URINALYSIS, MICROSCOPIC    LIPASE    ketorolac  (TORADOL ) 30 mg/mL injection    ondansetron  (ZOFRAN ) 2 mg/mL injection    iopamidol  (ISOVUE -370) 76% solution    Ibuprofen  (MOTRIN ) 800 mg Oral Tablet    promethazine  (PHENERGAN ) 25 mg Oral Tablet           Abnormal Lab results:  Labs Ordered/Reviewed   COMPREHENSIVE METABOLIC PANEL, NON-FASTING - Abnormal; Notable for the following components:       Result Value    GLUCOSE 117 (*)     ALBUMIN/GLOBULIN RATIO 1.6 (*)     CALCIUM, CORRECTED 8.8 (*)     All other components within normal limits    Narrative:     Estimated Glomerular Filtration Rate (eGFR) is calculated using the CKD-EPI (2021) equation, intended for patients 91 years of age and older. If gender is not documented or unknown, there will be no eGFR calculation.     CBC WITH DIFF - Abnormal; Notable for the following components:    RBC 5.17 (*)     HGB 15.6 (*)     HCT 46.0 (*)     EOSINOPHIL % 0 (*)     All other components within normal limits   URINALYSIS, MICROSCOPIC - Abnormal; Notable for the following components:    WBCS 6-10 (*)     SQUAMOUS EPITHELIAL Few (*)     All other components within normal limits   HCG, PLASMA OR SERUM QUANTITATIVE - Normal   URINALYSIS, MACROSCOPIC - Normal   LIPASE - Normal   CBC/DIFF    Narrative:     The following orders were created for panel order CBC/DIFF.  Procedure  Abnormality          Status                     ---------                               -----------         ------                     CBC WITH IPQQ[[id number]]                Abnormal            Final result                 Please view results for these tests on the individual orders.   URINALYSIS, MACROSCOPIC AND MICROSCOPIC W/CULTURE REFLEX    Narrative:     The following orders were created for panel order URINALYSIS, MACROSCOPIC AND MICROSCOPIC W/CULTURE REFLEX.  Procedure                               Abnormality         Status                     ---------                               -----------         ------                     URINALYSIS, MACROSCOPIC[[id number]]      Normal              Final result               URINALYSIS, MICROSCOPIC[[id number]]      Abnormal            Final result                 Please view results for these tests on the individual orders.           Orders Placed This Encounter    CT ABDOMEN PELVIS W IV CONTRAST    CBC/DIFF    COMPREHENSIVE METABOLIC PANEL, NON-FASTING    HCG, PLASMA OR SERUM QUANTITATIVE, PREGNANCY    URINALYSIS, MACROSCOPIC AND MICROSCOPIC W/CULTURE REFLEX    CBC WITH DIFF    URINALYSIS, MACROSCOPIC    URINALYSIS, MICROSCOPIC    LIPASE    ketorolac  (TORADOL ) 30 mg/mL injection    ondansetron  (ZOFRAN ) 2 mg/mL injection    iopamidol  (ISOVUE -370) 76% solution    Ibuprofen  (MOTRIN ) 800 mg Oral Tablet    promethazine  (PHENERGAN ) 25 mg Oral Tablet         Any procedures:  Procedures    Impression:   Clinical Impression   Pelvic pain (Primary)   History of PCOS       Disposition: Discharged    / Eleanor Mackie FNP-C  06/05/2024, 20:13  [facility], [place] Departments of Emergency Medicine  [place]  [place]    Portions of this note may have been dictated using voice recognition software.     -----------------------  Results for orders placed or performed during the hospital encounter of 06/05/24 (from the past 12 hours)  COMPREHENSIVE METABOLIC PANEL,  NON-FASTING   Result Value Ref Range    SODIUM 142 136 - 145 mmol/L    POTASSIUM 3.8 3.5 - 5.1 mmol/L    CHLORIDE 105 98 - 107 mmol/L    CO2 TOTAL 25 21 - 31 mmol/L    ANION GAP 12 4 - 13 mmol/L    BUN 8 7 - 25 mg/dL    CREATININE 9.08 9.39 - 1.30 mg/dL    BUN/CREA RATIO 9 6 - 22    ESTIMATED GFR 80 >59 mL/min/1.32m2    ALBUMIN 5.1 3.5 - 5.7 g/dL    CALCIUM 9.7 8.6 - 89.6 mg/dL    GLUCOSE 882 (H) 74 - 109 mg/dL    ALKALINE PHOSPHATASE 69 34 - 104 U/L    ALT (SGPT) 18 7 - 52 U/L    AST (SGOT) 19 13 - 39 U/L    BILIRUBIN TOTAL 0.5 0.3 - 1.0 mg/dL    PROTEIN TOTAL 8.2 6.4 - 8.9 g/dL    ALBUMIN/GLOBULIN RATIO 1.6 (H) 0.8 - 1.4    OSMOLALITY, CALCULATED 283 270 - 290 mOsm/kg    CALCIUM, CORRECTED 8.8 (L) 8.9 - 10.8 mg/dL    GLOBULIN 3.1 2.0 - 3.5   HCG, PLASMA OR SERUM QUANTITATIVE, PREGNANCY   Result Value Ref Range    HCG, QUANTITATIVE <1.00 <5.00 mIU/mL   CBC WITH DIFF   Result Value Ref Range    WBC 10.2 3.8 - 11.8 x103/uL    RBC 5.17 (H) 3.63 - 4.92 x106/uL    HGB 15.6 (H) 10.9 - 14.3 g/dL    HCT 53.9 (H) 68.7 - 41.9 %    MCV 89.0 75.5 - 95.3 fL    MCH 30.3 24.7 - 32.8 pg    MCHC 34.0 32.3 - 35.6 g/dL    RDW 85.6 87.6 - 82.2 %    PLATELETS 335 140 - 440 x103/uL    MPV 8.2 7.9 - 10.8 fL    NEUTROPHIL % 63 43 - 77 %    LYMPHOCYTE % 31 16 - 46 %    MONOCYTE % 5 4 - 11 %    EOSINOPHIL % 0 (L) 1 - 7 %    BASOPHIL % 1 0 - 1 %    NEUTROPHIL # 6.50 1.90 - 8.20 x103/uL    LYMPHOCYTE # 3.10 1.10 - 3.10 x103/uL    MONOCYTE # 0.50 0.20 - 0.90 x103/uL    EOSINOPHIL # 0.00 0.00 - 0.50 x103/uL    BASOPHIL # 0.10 0.00 - 0.10 x103/uL   LIPASE   Result Value Ref Range    LIPASE 26 11 - 82 U/L   URINALYSIS, MACROSCOPIC   Result Value Ref Range    APPEARANCE Clear Clear    COLOR Yellow Yellow, Colorless    PH 6.0 5.0 - 8.0    SPECIFIC GRAVITY 1.020 1.005 - 1.030    GLUCOSE Negative Negative mg/dL    BILIRUBIN Negative Negative mg/dL    BLOOD Negative Negative mg/dL    PROTEIN Negative Negative mg/dL    UROBILINOGEN Negative  Negative, 2  mg/dL    KETONES Negative Negative mg/dL    LEUKOCYTE ESTERASE Negative Negative WBCs/uL    NITRITE Negative Negative   URINALYSIS, MICROSCOPIC   Result Value Ref Range    WBCS 6-10 (A) None, 1-5 /hpf    RBCS 1-5 None, 1-5 /hpf    BACTERIA None None, Rare /hpf    SQUAMOUS EPITHELIAL Few (A) None, Rare /hpf  MUCOUS Light /hpf     CT ABDOMEN PELVIS W IV CONTRAST   Final Result   Postsurgical changes of partial small bowel and distal colonic resection. No evidence of bowel obstruction. Colonic diverticulosis, currently without evidence of acute diverticulitis.      Hepatomegaly and steatosis.      Small amount of free pelvic fluid, likely physiologic.               Radiologist location ID: [id number]                  [1] No Known Allergies

## 2024-06-06 ENCOUNTER — Encounter (HOSPITAL_COMMUNITY): Payer: Self-pay

## 2024-06-06 LAB — URINALYSIS, MACROSCOPIC
BILIRUBIN: NEGATIVE mg/dL
BLOOD: NEGATIVE mg/dL
GLUCOSE: NEGATIVE mg/dL
KETONES: NEGATIVE mg/dL
LEUKOCYTE ESTERASE: NEGATIVE WBCs/uL
NITRITE: NEGATIVE
PH: 6 (ref 5.0–8.0)
PROTEIN: NEGATIVE mg/dL
SPECIFIC GRAVITY: 1.02 (ref 1.005–1.030)
UROBILINOGEN: NEGATIVE mg/dL

## 2024-06-06 LAB — URINALYSIS, MICROSCOPIC

## 2024-06-06 MED ORDER — IBUPROFEN 800 MG TABLET: 800.0000 mg | ORAL_TABLET | Freq: Four times a day (QID) | ORAL | 0 refills | Status: AC | PRN

## 2024-06-06 MED ORDER — PROMETHAZINE 25 MG TABLET: 25.0000 mg | ORAL_TABLET | Freq: Three times a day (TID) | ORAL | 0 refills | Status: AC | PRN

## 2024-06-06 NOTE — ED Notes (Signed)
 [facility] - Emergency Department  Peer Recovery Coach Assessment    Initial Evaluation  Referred by:: Nurse  Location of Evaluation: Emergency Department  How many times in the last 12 months have you been to the ED?: 6 or more  Have you ever served or are you currently serving in the Armed Forces?: No             Substance Use History  Patient current substance use status: Missed patient 1/22 @7 :37pm. Patient self disclosed using marijuana at least twice a week. Safe marijuana practices, harm reduction and long term use shared with patient.              Within the last 30 days, what substances has the patient used?: Marijuana  Drug route of administration: Smoked                   Family, Social, Home & Safety History                                                 Employment  Current employment status: Other    Needs vocational training?: No  Needs assistance with job search?: No    Engagement  Readiness ruler: 1  Summary of assessment priority areas: Other  Summary of assessment priority areas: comments: Missed patient 1/22 @7 :37pm. Patient self disclosed using marijuana at least twice a week. Safe marijuana practices, harm reduction and long term use shared with patient.    Brief Intervention  Discussed plan to reduce/quit substance use?: Yes  Discussed willingness to enter treatment?: No  Indicated patient's stage of change:: 1 - Precontemplation    Patient seen by Peer Recovery Coach and is a candidate for buprenorphine  administration in the ED. Patient needs assessment for bup treatment.: No    Plan  Was the patient referred to treatment?: No    Was patient referred to physician for Buprenorphine  Assessment in the ED?: No    Did patient receive Narcan in the ED?: No    Plan: Additional Comments: Missed patient 1/22 @7 :37pm. Patient self disclosed using marijuana at least twice a week. Safe marijuana practices, harm reduction and long term use shared with patient.    Follow-up            Need for additional follow-up?: No       Suzen Satchel, Peer Recovery Coach 06/06/2024 07:41

## 2024-06-06 NOTE — ED Nurses Note (Signed)

## 2024-06-16 ENCOUNTER — Other Ambulatory Visit: Payer: Self-pay

## 2024-06-16 ENCOUNTER — Ambulatory Visit: Attending: Obstetrics & Gynecology

## 2024-06-16 DIAGNOSIS — Z01818 Encounter for other preprocedural examination: Secondary | ICD-10-CM | POA: Insufficient documentation

## 2024-06-16 LAB — BASIC METABOLIC PANEL
ANION GAP: 6 mmol/L (ref 4–13)
BUN/CREA RATIO: 9 (ref 6–22)
BUN: 8 mg/dL (ref 7–25)
CALCIUM: 9.1 mg/dL (ref 8.6–10.3)
CHLORIDE: 106 mmol/L (ref 98–107)
CO2 TOTAL: 28 mmol/L (ref 21–31)
CREATININE: 0.87 mg/dL (ref 0.60–1.30)
ESTIMATED GFR: 84 mL/min/{1.73_m2} (ref >59)
GLUCOSE: 114 mg/dL — ABNORMAL HIGH (ref 74–109)
OSMOLALITY, CALCULATED: 279 mosm/kg (ref 270–290)
POTASSIUM: 4 mmol/L (ref 3.5–5.1)
SODIUM: 140 mmol/L (ref 136–145)

## 2024-06-16 LAB — CBC WITH DIFF
BASOPHIL #: 0.1 10*3/uL (ref 0.00–0.10)
BASOPHIL %: 1 % (ref 0–1)
EOSINOPHIL #: 0 10*3/uL (ref 0.00–0.50)
EOSINOPHIL %: 1 % (ref 1–7)
HCT: 42.3 % — ABNORMAL HIGH (ref 31.2–41.9)
HGB: 14.5 g/dL — ABNORMAL HIGH (ref 10.9–14.3)
LYMPHOCYTE #: 2.5 10*3/uL (ref 1.10–3.10)
LYMPHOCYTE %: 30 % (ref 16–46)
MCH: 30.1 pg (ref 24.7–32.8)
MCHC: 34.2 g/dL (ref 32.3–35.6)
MCV: 87.9 fL (ref 75.5–95.3)
MONOCYTE #: 0.4 10*3/uL (ref 0.20–0.90)
MONOCYTE %: 5 % (ref 4–11)
MPV: 7.9 fL (ref 7.9–10.8)
NEUTROPHIL #: 5.3 10*3/uL (ref 1.90–8.20)
NEUTROPHIL %: 64 % (ref 43–77)
PLATELETS: 327 10*3/uL (ref 140–440)
RBC: 4.81 10*6/uL (ref 3.63–4.92)
RDW: 14.5 % (ref 12.3–17.7)
WBC: 8.3 10*3/uL (ref 3.8–11.8)

## 2024-07-01 ENCOUNTER — Ambulatory Visit: Admission: RE | Admit: 2024-07-01 | Source: Ambulatory Visit | Admitting: Obstetrics & Gynecology

## 2024-07-01 ENCOUNTER — Encounter (HOSPITAL_COMMUNITY): Payer: Self-pay | Admitting: Certified Registered"

## 2024-07-01 ENCOUNTER — Encounter (HOSPITAL_COMMUNITY): Admission: RE | Payer: Self-pay | Source: Ambulatory Visit
